# Patient Record
Sex: Female | Born: 1970
Health system: Southern US, Community
[De-identification: ages and names within clinical notes are randomized; demographics above are authoritative.]

## PROBLEM LIST (undated history)

## (undated) DIAGNOSIS — L732 Hidradenitis suppurativa: Secondary | ICD-10-CM

## (undated) DIAGNOSIS — A159 Respiratory tuberculosis unspecified: Secondary | ICD-10-CM

## (undated) DIAGNOSIS — G2581 Restless legs syndrome: Secondary | ICD-10-CM

## (undated) DIAGNOSIS — K8020 Calculus of gallbladder without cholecystitis without obstruction: Secondary | ICD-10-CM

## (undated) DIAGNOSIS — I10 Essential (primary) hypertension: Secondary | ICD-10-CM

## (undated) DIAGNOSIS — D333 Benign neoplasm of cranial nerves: Secondary | ICD-10-CM

## (undated) DIAGNOSIS — K648 Other hemorrhoids: Secondary | ICD-10-CM

## (undated) DIAGNOSIS — M1990 Unspecified osteoarthritis, unspecified site: Secondary | ICD-10-CM

## (undated) DIAGNOSIS — K219 Gastro-esophageal reflux disease without esophagitis: Secondary | ICD-10-CM

## (undated) DIAGNOSIS — Z1211 Encounter for screening for malignant neoplasm of colon: Secondary | ICD-10-CM

## (undated) DIAGNOSIS — J45909 Unspecified asthma, uncomplicated: Secondary | ICD-10-CM

## (undated) DIAGNOSIS — H9319 Tinnitus, unspecified ear: Secondary | ICD-10-CM

## (undated) HISTORY — DX: Essential (primary) hypertension: I10

## (undated) HISTORY — DX: Unspecified asthma, uncomplicated: J45.909

## (undated) HISTORY — DX: Encounter for screening for malignant neoplasm of colon: Z12.11

## (undated) HISTORY — DX: Unspecified osteoarthritis, unspecified site: M19.90

## (undated) HISTORY — DX: Respiratory tuberculosis unspecified: A15.9

## (undated) HISTORY — PX: BILATERAL CARPAL TUNNEL RELEASE: SHX6508

## (undated) HISTORY — PX: TUBAL LIGATION: SHX77

---
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
## 2005-01-30 ENCOUNTER — Ambulatory Visit: Payer: Self-pay | Admitting: Emergency Medicine

## 2005-01-30 ENCOUNTER — Emergency Department: Payer: Self-pay | Admitting: Emergency Medicine

## 2005-01-30 ENCOUNTER — Other Ambulatory Visit: Payer: Self-pay

## 2005-11-27 ENCOUNTER — Encounter: Payer: Self-pay | Admitting: Maternal & Fetal Medicine

## 2006-03-20 ENCOUNTER — Observation Stay: Payer: Self-pay | Admitting: Obstetrics and Gynecology

## 2006-04-07 ENCOUNTER — Observation Stay: Payer: Self-pay | Admitting: Obstetrics and Gynecology

## 2006-04-25 ENCOUNTER — Inpatient Hospital Stay: Payer: Self-pay

## 2009-06-30 ENCOUNTER — Ambulatory Visit: Payer: Self-pay | Admitting: General Practice

## 2010-09-06 ENCOUNTER — Emergency Department: Payer: Self-pay | Admitting: *Deleted

## 2012-02-02 ENCOUNTER — Ambulatory Visit: Payer: Self-pay | Admitting: Internal Medicine

## 2012-02-16 ENCOUNTER — Ambulatory Visit: Payer: Self-pay | Admitting: Internal Medicine

## 2012-02-23 ENCOUNTER — Ambulatory Visit: Payer: Self-pay | Admitting: Internal Medicine

## 2013-02-07 ENCOUNTER — Ambulatory Visit: Payer: Self-pay | Admitting: General Practice

## 2013-07-23 ENCOUNTER — Ambulatory Visit: Payer: Self-pay | Admitting: Internal Medicine

## 2014-05-09 NOTE — Op Note (Signed)
PATIENT NAME:  Theresa Lester, Theresa Lester MR#:  [id number] DATE OF BIRTH:  1971-01-13  DATE OF PROCEDURE:  02/07/2013  PREOPERATIVE DIAGNOSIS: Right carpal tunnel syndrome.   POSTOPERATIVE DIAGNOSIS: Right carpal tunnel syndrome.  PROCEDURE PERFORMED: Right carpal tunnel release.   SURGEON: Skip Estimable, MD.   ANESTHESIA: General.   ESTIMATED BLOOD LOSS: Minimal.   TOURNIQUET TIME: 31 minutes.   DRAINS: None.   INDICATIONS FOR SURGERY: The patient is a 44 year old female, who has been seen for complaints of numbness and paresthesias to the right hand. Previous EMG nerve conduction studies were consistent with carpal tunnel syndrome. Symptoms have progressed more recently. After discussion of the risks and benefits of surgical intervention, the patient expressed understanding of the risks, benefits, and agreed with plans for surgical intervention.   PROCEDURE IN DETAIL: The patient was brought in the operating room and then, after adequate general anesthesia was achieved, a tourniquet was placed on the patient's upper right arm. The patient's right hand and arm were cleaned and prepped with alcohol and DuraPrep and draped in the usual sterile fashion. A "timeout" was performed as per usual protocol. The right upper extremity was exsanguinated using an Esmarch, and tourniquet was inflated to 250 mmHg. Loupe magnification was used throughout the procedure. A curvilinear incision was made just ulnar to the thenar palmar crease. Dissection was carried down through the palmar fascia to the transverse carpal ligament. The transverse carpal ligament was sharply incised, taking care to protect the underlying structures within the carpal tunnel. Complete release of the transverse carpal ligament was achieved. Inspection of the median nerve demonstrated indentation consistent with compression at the previous site of the transverse carpal ligament. No evidence of lipoma or ganglion cyst within the tunnel. The wound  was irrigated with copious amounts of normal saline with antibiotic solution. Skin edges were reapproximated using interrupted sutures of 5-0 nylon. Marcaine 0.25% (10 mL) was injected along the incision site. A sterile dressing was applied followed by application of a volar splint. Tourniquet was deflated after total tourniquet time of 31 minutes. The patient tolerated the procedure well. She was transported to the recovery room in stable condition. ____________________________ Laurice Record. Holley Bouche., MD jph:aw D: 02/07/2013 08:59:51 ET T: 02/07/2013 09:14:52 ET JOB#: [id number]  cc: Jeneen Rinks P. Holley Bouche., MD, <Dictator> Laurice Record Holley Bouche MD ELECTRONICALLY SIGNED 03/07/2013 6:37

## 2014-10-19 ENCOUNTER — Encounter: Payer: Self-pay | Admitting: Internal Medicine

## 2014-10-19 ENCOUNTER — Other Ambulatory Visit: Payer: Self-pay | Admitting: Internal Medicine

## 2014-10-19 DIAGNOSIS — K8020 Calculus of gallbladder without cholecystitis without obstruction: Secondary | ICD-10-CM | POA: Insufficient documentation

## 2014-10-19 DIAGNOSIS — K130 Diseases of lips: Secondary | ICD-10-CM | POA: Insufficient documentation

## 2014-10-19 DIAGNOSIS — R000 Tachycardia, unspecified: Secondary | ICD-10-CM

## 2014-10-19 DIAGNOSIS — K648 Other hemorrhoids: Secondary | ICD-10-CM | POA: Insufficient documentation

## 2014-10-19 DIAGNOSIS — M25559 Pain in unspecified hip: Secondary | ICD-10-CM

## 2014-10-19 DIAGNOSIS — G2581 Restless legs syndrome: Secondary | ICD-10-CM | POA: Insufficient documentation

## 2014-10-19 DIAGNOSIS — R519 Headache, unspecified: Secondary | ICD-10-CM | POA: Insufficient documentation

## 2014-10-19 DIAGNOSIS — T783XXA Angioneurotic edema, initial encounter: Secondary | ICD-10-CM | POA: Insufficient documentation

## 2014-10-19 DIAGNOSIS — K219 Gastro-esophageal reflux disease without esophagitis: Secondary | ICD-10-CM | POA: Insufficient documentation

## 2014-10-19 DIAGNOSIS — R739 Hyperglycemia, unspecified: Secondary | ICD-10-CM

## 2014-10-19 DIAGNOSIS — R51 Headache: Secondary | ICD-10-CM

## 2014-10-19 HISTORY — DX: Tachycardia, unspecified: R00.0

## 2014-10-19 HISTORY — DX: Pain in unspecified hip: M25.559

## 2014-10-19 HISTORY — DX: Hyperglycemia, unspecified: R73.9

## 2014-10-20 ENCOUNTER — Ambulatory Visit (INDEPENDENT_AMBULATORY_CARE_PROVIDER_SITE_OTHER): Payer: Commercial Managed Care - PPO | Admitting: Internal Medicine

## 2014-10-20 ENCOUNTER — Encounter: Payer: Self-pay | Admitting: Internal Medicine

## 2014-10-20 VITALS — BP 132/82 | HR 80 | Ht 63.0 in | Wt 197.2 lb

## 2014-10-20 DIAGNOSIS — Z23 Encounter for immunization: Secondary | ICD-10-CM | POA: Diagnosis not present

## 2014-10-20 DIAGNOSIS — G44209 Tension-type headache, unspecified, not intractable: Secondary | ICD-10-CM | POA: Diagnosis not present

## 2014-10-20 DIAGNOSIS — G43909 Migraine, unspecified, not intractable, without status migrainosus: Secondary | ICD-10-CM

## 2014-10-20 MED ORDER — BUTALBITAL-APAP-CAFFEINE 50-325-40 MG PO TABS: 1.0000 | ORAL_TABLET | Freq: Two times a day (BID) | ORAL | Status: DC | PRN

## 2014-10-20 NOTE — Progress Notes (Signed)
Date:  10/20/2014   Name:  Theresa Lester   DOB:  01/18/1970   MRN:  [id number]   Chief Complaint: Headache Headache  This is a chronic problem. The current episode started 1 to 4 weeks ago. The problem occurs daily. The problem has been unchanged. The pain is located in the right unilateral region. The pain radiates to the face, right arm and right shoulder. The pain quality is not similar to prior headaches. The quality of the pain is described as band-like. The pain is mild. Associated symptoms include eye watering, numbness, scalp tenderness and tingling. Pertinent negatives include no fever, hearing loss, loss of balance, nausea, neck pain, phonophobia, photophobia, visual change or weakness. She has tried Excedrin for the symptoms. The treatment provided mild relief.   She has a long history of headaches which she describes as tension headaches. She takes aspirin for those. Over the past 2 months that she's had a headache on the right side which she describes as bandlike. Pain level 6 out of 10. The headache starts it radiates down the back of her neck and also causes some tingling sensation in her right upper extremity. She denies any weakness, nausea, vomiting, or light sensitivity.  Review of Systems:  Review of Systems  Constitutional: Negative for fever, fatigue and unexpected weight change.  HENT: Negative for hearing loss.   Eyes: Negative for photophobia.  Respiratory: Negative for chest tightness and shortness of breath.   Cardiovascular: Negative for chest pain, palpitations and leg swelling.  Gastrointestinal: Negative for nausea and constipation.  Musculoskeletal: Positive for neck stiffness. Negative for arthralgias and neck pain.  Neurological: Positive for tingling, numbness and headaches. Negative for weakness and loss of balance.  Psychiatric/Behavioral: Negative for dysphoric mood and decreased concentration.    Patient Active Problem List   Diagnosis Date Noted   . Angioneurotic edema 10/19/2014  . Arthralgia of hip or thigh 10/19/2014  . Chapped lips 10/19/2014  . Episodic headache 10/19/2014  . Calculus of gallbladder 10/19/2014  . Acid reflux 10/19/2014  . Blood glucose elevated 10/19/2014  . Hemorrhoids, internal 10/19/2014  . Restless leg 10/19/2014  . Fast heart beat 10/19/2014    Prior to Admission medications   Medication Sig Start Date End Date Taking? Authorizing Provider  aspirin 325 MG tablet Take 325 mg by mouth daily.   Yes Historical Provider, MD  bismuth subsalicylate (PEPTO BISMOL) 262 MG/15ML suspension Take 30 mLs by mouth every 6 (six) hours as needed.   Yes Historical Provider, MD    No Known Allergies  Past Surgical History  Procedure Laterality Date  . Bilateral carpal tunnel release    . Tubal ligation      Social History  Substance Use Topics  . Smoking status: Never Smoker   . Smokeless tobacco: None  . Alcohol Use: No     Medication list has been reviewed and updated.  Physical Examination:  Physical Exam  Constitutional: She is oriented to person, place, and time. She appears well-developed and well-nourished. No distress.  HENT:  Head: Normocephalic and atraumatic.  Eyes: Conjunctivae and EOM are normal. Pupils are equal, round, and reactive to light. Right eye exhibits no discharge. Left eye exhibits no discharge. No scleral icterus.  Neck: Carotid bruit is not present. Decreased range of motion present. No thyromegaly present.  Cardiovascular: Normal rate, regular rhythm and normal heart sounds.   Pulmonary/Chest: Effort normal and breath sounds normal. No respiratory distress.  Musculoskeletal:  Cervical back: She exhibits decreased range of motion, tenderness and spasm.  Neurological: She is alert and oriented to person, place, and time. She has normal strength and normal reflexes. No cranial nerve deficit or sensory deficit.  Skin: Skin is warm and dry. No rash noted.  Psychiatric: Her  behavior is normal. Thought content normal. Her mood appears anxious.  Nursing note and vitals reviewed.   BP 132/82 mmHg  Pulse 80  Ht 5\' 3"  (1.6 m)  Wt 197 lb 3.2 oz (89.449 kg)  BMI 34.94 kg/m2  Assessment and Plan: 1. Mixed migraine and muscle contraction headache (Fioricet 1 twice a day as needed for headache Recommend heat and topical rubs to relieve neck muscle tension Consider migraine preventative such as Topamax at follow-up visit if headache persists - butalbital-acetaminophen-caffeine (FIORICET, ESGIC) 50-325-40 MG tablet; Take 1 tablet by mouth 2 (two) times daily as needed for headache or migraine.  Dispense: 60 tablet; Refill: 1  2. Flu vaccine need - Flu Vaccine QUAD 36+ mos PF IM (Fluarix & Fluzone Quad PF)   Halina Maidens, MD [facility]  10/20/2014

## 2014-10-21 ENCOUNTER — Telehealth: Payer: Self-pay

## 2014-10-21 ENCOUNTER — Other Ambulatory Visit: Payer: Self-pay | Admitting: Internal Medicine

## 2014-10-21 MED ORDER — TRIAMCINOLONE ACETONIDE 0.025 % EX OINT: 1.0000 "application " | TOPICAL_OINTMENT | Freq: Two times a day (BID) | CUTANEOUS | Status: DC

## 2014-10-21 NOTE — Telephone Encounter (Signed)
Patient wants refill on Triamcinolone Acetonide Cream 0.025% for lips, K13.0. dr

## 2014-11-23 ENCOUNTER — Ambulatory Visit: Payer: Commercial Managed Care - PPO | Admitting: Internal Medicine

## 2014-11-25 ENCOUNTER — Ambulatory Visit (INDEPENDENT_AMBULATORY_CARE_PROVIDER_SITE_OTHER): Payer: Commercial Managed Care - PPO | Admitting: Internal Medicine

## 2014-11-25 ENCOUNTER — Encounter: Payer: Self-pay | Admitting: Internal Medicine

## 2014-11-25 VITALS — BP 120/70 | HR 76 | Ht 63.0 in | Wt 191.4 lb

## 2014-11-25 DIAGNOSIS — R479 Unspecified speech disturbances: Secondary | ICD-10-CM

## 2014-11-25 DIAGNOSIS — H9319 Tinnitus, unspecified ear: Secondary | ICD-10-CM

## 2014-11-25 DIAGNOSIS — H9311 Tinnitus, right ear: Secondary | ICD-10-CM

## 2014-11-25 DIAGNOSIS — G43909 Migraine, unspecified, not intractable, without status migrainosus: Secondary | ICD-10-CM | POA: Diagnosis not present

## 2014-11-25 DIAGNOSIS — G44209 Tension-type headache, unspecified, not intractable: Secondary | ICD-10-CM

## 2014-11-25 HISTORY — DX: Tinnitus, unspecified ear: H93.19

## 2014-11-25 MED ORDER — BUTALBITAL-APAP-CAFFEINE 50-325-40 MG PO TABS: 1.0000 | ORAL_TABLET | Freq: Two times a day (BID) | ORAL | Status: DC | PRN

## 2014-11-25 MED ORDER — CYCLOBENZAPRINE HCL 10 MG PO TABS: 5.0000 mg | ORAL_TABLET | Freq: Two times a day (BID) | ORAL | Status: DC | PRN

## 2014-11-25 NOTE — Progress Notes (Signed)
Date:  11/25/2014   Name:  Theresa Lester   DOB:  1970/10/20   MRN:  [id number]   Chief Complaint: Headache Headache  This is a recurrent problem. The current episode started more than 1 month ago. The problem occurs daily. The problem has been unchanged. The pain is located in the right unilateral region. The pain quality is similar to prior headaches. Associated symptoms include hearing loss and tinnitus. Pertinent negatives include no dizziness, fever, numbness, seizures or weakness. Nothing aggravates the symptoms.   She continues to have a headache daily. The headache quality and severity waxes and wanes. It seems to be worse when she tries to sleep. She does get relief from butalbital prescribed last visit. She also relates an issue with speech. At times she has difficulty enunciating a word. She describes it as slurring but not with every word. The speech difficulty is not associated with headache. She states this has been happening off and on for a number of months. Her other complaint is ringing in the right ear and difficulty understanding speech. The ringing is more or less constant but the lack of understanding of speech comes and goes. She doesn't describe it as misunderstanding the meaning but as not hearing the words clearly. This is also not associated with severe headache. She has no history of neurology evaluation for headache or other neurological symptoms. She has never had an MRI or CT scan of the brain.   Review of Systems  Constitutional: Negative for fever and fatigue.  HENT: Positive for hearing loss and tinnitus. Negative for trouble swallowing and voice change.   Eyes: Negative for visual disturbance.  Respiratory: Negative for chest tightness and shortness of breath.   Cardiovascular: Negative for chest pain.  Musculoskeletal: Positive for myalgias and neck stiffness. Negative for gait problem.  Neurological: Positive for speech difficulty and headaches.  Negative for dizziness, seizures, syncope, facial asymmetry, weakness and numbness.       Intermittent dysphagia and intermittent impairment of hearing  Hematological: Negative for adenopathy.  Psychiatric/Behavioral: Negative for confusion and dysphoric mood.    Patient Active Problem List   Diagnosis Date Noted  . Mixed migraine and muscle contraction headache 10/20/2014  . Angioneurotic edema 10/19/2014  . Arthralgia of hip or thigh 10/19/2014  . Chapped lips 10/19/2014  . Episodic headache 10/19/2014  . Calculus of gallbladder 10/19/2014  . Acid reflux 10/19/2014  . Blood glucose elevated 10/19/2014  . Hemorrhoids, internal 10/19/2014  . Restless leg 10/19/2014  . Fast heart beat 10/19/2014    Prior to Admission medications   Medication Sig Start Date End Date Taking? Authorizing Provider  aspirin 325 MG tablet Take 325 mg by mouth daily.   Yes Historical Provider, MD  bismuth subsalicylate (PEPTO BISMOL) 262 MG/15ML suspension Take 30 mLs by mouth every 6 (six) hours as needed.   Yes Historical Provider, MD  butalbital-acetaminophen-caffeine (FIORICET, ESGIC) 50-325-40 MG tablet Take 1 tablet by mouth 2 (two) times daily as needed for headache or migraine. 10/20/14  Yes [name], MD  triamcinolone (KENALOG) 0.025 % ointment Apply 1 application topically 2 (two) times daily. 10/21/14  Yes [name], MD    No Known Allergies  Past Surgical History  Procedure Laterality Date  . Bilateral carpal tunnel release    . Tubal ligation      Social History  Substance Use Topics  . Smoking status: Never Smoker   . Smokeless tobacco: None  . Alcohol Use: No  Medication list has been reviewed and updated.   Physical Exam  Constitutional: She is oriented to person, place, and time. She appears well-developed and well-nourished.  HENT:  Left Ear: Tympanic membrane and ear canal normal.  Excessive cerumen right ear canal  Eyes: EOM are normal. Pupils are  equal, round, and reactive to light. Right eye exhibits no discharge. Left eye exhibits no discharge.  Neck: Muscular tenderness present. Carotid bruit is not present. No thyromegaly present.  Cardiovascular: Normal rate, regular rhythm and normal heart sounds.   Pulmonary/Chest: Effort normal and breath sounds normal.  Musculoskeletal: She exhibits no edema.  Lymphadenopathy:    She has no cervical adenopathy.  Neurological: She is alert and oriented to person, place, and time. She has normal strength and normal reflexes. No cranial nerve deficit.  Psychiatric: She has a normal mood and affect. Her behavior is normal. Her speech is not delayed and not slurred.  Nursing note and vitals reviewed.   BP 120/70 mmHg  Pulse 76  Ht 5\' 3"  (1.6 m)  Wt 191 lb 6.4 oz (86.818 kg)  BMI 33.91 kg/m2  Assessment and Plan: 1. Mixed migraine and muscle contraction headache Begin Flexeril one half tablet twice a day as needed for muscle tension Continue butalbital as needed - cyclobenzaprine (FLEXERIL) 10 MG tablet; Take 0.5 tablets (5 mg total) by mouth 2 (two) times daily as needed for muscle spasms.  Dispense: 30 tablet; Refill: 1 - butalbital-acetaminophen-caffeine (FIORICET, ESGIC) 50-325-40 MG tablet; Take 1 tablet by mouth 2 (two) times daily as needed for headache or migraine.  Dispense: 60 tablet; Refill: 1 - MR Brain W Wo Contrast; Future  2. Speech abnormality Concern for possible intracranial pathology MRI will provide the most comprehensive information - MR Brain W Wo Contrast; Future  3. Tinnitus, right Possible acoustic neuroma - await MRI results - MR Brain W Wo Contrast; Future   Halina Maidens, MD [facility]  11/25/2014

## 2014-12-11 ENCOUNTER — Ambulatory Visit
Admission: RE | Admit: 2014-12-11 | Discharge: 2014-12-11 | Disposition: A | Discharge status: Home / Self Care | Payer: Commercial Managed Care - PPO | Source: Ambulatory Visit | Attending: Internal Medicine | Admitting: Internal Medicine

## 2014-12-11 DIAGNOSIS — G43909 Migraine, unspecified, not intractable, without status migrainosus: Secondary | ICD-10-CM

## 2014-12-11 DIAGNOSIS — H9311 Tinnitus, right ear: Secondary | ICD-10-CM

## 2014-12-11 DIAGNOSIS — R479 Unspecified speech disturbances: Secondary | ICD-10-CM

## 2014-12-11 DIAGNOSIS — G44209 Tension-type headache, unspecified, not intractable: Principal | ICD-10-CM

## 2014-12-11 MED ORDER — GADOBENATE DIMEGLUMINE 529 MG/ML IV SOLN: 20.0000 mL | Freq: Once | INTRAVENOUS | Status: DC | PRN

## 2014-12-14 ENCOUNTER — Telehealth: Payer: Self-pay

## 2014-12-14 ENCOUNTER — Other Ambulatory Visit: Payer: Self-pay | Admitting: Internal Medicine

## 2014-12-14 MED ORDER — DIAZEPAM 5 MG PO TABS: 5.0000 mg | ORAL_TABLET | Freq: Once | ORAL | Status: DC

## 2014-12-14 NOTE — Telephone Encounter (Signed)
Patient needs pill to " relax " so she can do MRI.dr

## 2014-12-22 ENCOUNTER — Telehealth: Payer: Self-pay

## 2014-12-22 NOTE — Telephone Encounter (Signed)
done

## 2015-01-05 ENCOUNTER — Other Ambulatory Visit: Payer: Self-pay | Admitting: Internal Medicine

## 2015-01-05 ENCOUNTER — Ambulatory Visit
Admission: RE | Admit: 2015-01-05 | Discharge: 2015-01-05 | Disposition: A | Discharge status: Home / Self Care | Payer: Commercial Managed Care - PPO | Source: Ambulatory Visit | Attending: Internal Medicine | Admitting: Internal Medicine

## 2015-01-05 DIAGNOSIS — R479 Unspecified speech disturbances: Secondary | ICD-10-CM | POA: Insufficient documentation

## 2015-01-05 DIAGNOSIS — G43909 Migraine, unspecified, not intractable, without status migrainosus: Secondary | ICD-10-CM | POA: Insufficient documentation

## 2015-01-05 DIAGNOSIS — H9311 Tinnitus, right ear: Secondary | ICD-10-CM | POA: Diagnosis present

## 2015-01-05 DIAGNOSIS — D333 Benign neoplasm of cranial nerves: Secondary | ICD-10-CM | POA: Insufficient documentation

## 2015-01-05 DIAGNOSIS — R9089 Other abnormal findings on diagnostic imaging of central nervous system: Secondary | ICD-10-CM

## 2015-01-05 MED ORDER — GADOBENATE DIMEGLUMINE 529 MG/ML IV SOLN
20.0000 mL | Freq: Once | INTRAVENOUS | Status: AC | PRN
  Administered 2015-01-05: 18 mL via INTRAVENOUS

## 2015-01-12 ENCOUNTER — Ambulatory Visit (INDEPENDENT_AMBULATORY_CARE_PROVIDER_SITE_OTHER): Payer: Commercial Managed Care - PPO | Admitting: Internal Medicine

## 2015-01-12 ENCOUNTER — Encounter: Payer: Self-pay | Admitting: Internal Medicine

## 2015-01-12 VITALS — BP 124/80 | HR 80 | Ht 63.0 in | Wt 187.4 lb

## 2015-01-12 DIAGNOSIS — D333 Benign neoplasm of cranial nerves: Secondary | ICD-10-CM | POA: Insufficient documentation

## 2015-01-12 DIAGNOSIS — S91209A Unspecified open wound of unspecified toe(s) with damage to nail, initial encounter: Secondary | ICD-10-CM | POA: Diagnosis not present

## 2015-01-12 DIAGNOSIS — L03032 Cellulitis of left toe: Secondary | ICD-10-CM

## 2015-01-12 DIAGNOSIS — Z86018 Personal history of other benign neoplasm: Secondary | ICD-10-CM | POA: Insufficient documentation

## 2015-01-12 MED ORDER — AMOXICILLIN-POT CLAVULANATE 875-125 MG PO TABS: 1.0000 | ORAL_TABLET | Freq: Two times a day (BID) | ORAL | Status: DC

## 2015-01-12 NOTE — Progress Notes (Signed)
Date:  01/12/2015   Name:  Theresa Lester   DOB:  01-24-1970   MRN:  [id number]   Chief Complaint: Toe Injury Toe Pain  The incident occurred 3 to 5 days ago. The incident occurred at home. The injury mechanism was a direct blow (jammed left great toe against a punch bowl on the floor). The pain is present in the left toes. The patient is experiencing no pain (unless wearing shoes). Pertinent negatives include no numbness.    Review of Systems  Constitutional: Negative for chills and fatigue.  HENT: Positive for congestion and tinnitus.   Respiratory: Negative for chest tightness and shortness of breath.   Cardiovascular: Negative for chest pain.  Skin: Positive for wound.  Neurological: Positive for light-headedness and headaches. Negative for tremors and numbness.    Patient Active Problem List   Diagnosis Date Noted  . Speech abnormality 11/25/2014  . Tinnitus 11/25/2014  . Mixed migraine and muscle contraction headache 10/20/2014  . Angioneurotic edema 10/19/2014  . Arthralgia of hip or thigh 10/19/2014  . Chapped lips 10/19/2014  . Episodic headache 10/19/2014  . Calculus of gallbladder 10/19/2014  . Acid reflux 10/19/2014  . Blood glucose elevated 10/19/2014  . Hemorrhoids, internal 10/19/2014  . Restless leg 10/19/2014  . Fast heart beat 10/19/2014    Prior to Admission medications   Medication Sig Start Date End Date Taking? Authorizing Provider  aspirin 325 MG tablet Take 325 mg by mouth daily.   Yes Historical Provider, MD  bismuth subsalicylate (PEPTO BISMOL) 262 MG/15ML suspension Take 30 mLs by mouth every 6 (six) hours as needed.   Yes Historical Provider, MD  butalbital-acetaminophen-caffeine (FIORICET, ESGIC) 50-325-40 MG tablet Take 1 tablet by mouth 2 (two) times daily as needed for headache or migraine. 11/25/14  Yes [name], MD  cyclobenzaprine (FLEXERIL) 10 MG tablet Take 0.5 tablets (5 mg total) by mouth 2 (two) times daily as needed  for muscle spasms. 11/25/14  Yes [name], MD  diazepam (VALIUM) 5 MG tablet Take 1 tablet (5 mg total) by mouth once. Take once 1 hours before MRI.  Must have a driver to and from the MRI appointment. 12/14/14  Yes [name], MD  triamcinolone (KENALOG) 0.025 % ointment Apply 1 application topically 2 (two) times daily. 10/21/14  Yes [name], MD    No Known Allergies  Past Surgical History  Procedure Laterality Date  . Bilateral carpal tunnel release    . Tubal ligation      Social History  Substance Use Topics  . Smoking status: Never Smoker   . Smokeless tobacco: None  . Alcohol Use: No     Medication list has been reviewed and updated.   Physical Exam  Constitutional: She appears well-developed and well-nourished. No distress.  Cardiovascular: Normal rate, regular rhythm and normal heart sounds.   Pulmonary/Chest: Effort normal and breath sounds normal.  Skin:  Redness around left great toe nail with purple discoloration under the nail.  Base of the nail appears to be elevated and separated from the nail bed.  Nursing note and vitals reviewed.   BP 124/80 mmHg  Pulse 80  Ht 5\' 3"  (1.6 m)  Wt 187 lb 6.4 oz (85.004 kg)  BMI 33.20 kg/m2  Assessment and Plan: 1. Cellulitis of toe of left foot - amoxicillin-clavulanate (AUGMENTIN) 875-125 MG tablet; Take 1 tablet by mouth 2 (two) times daily.  Dispense: 20 tablet; Refill: 0  2. Nail avulsion  of toe, initial encounter Local care advised; if nail separates will refer to Podiatry  3. Unilateral vestibular schwannoma [facility]) Referred to Neurosurgery - appointment in January   Halina Maidens, MD [facility]  01/12/2015

## 2015-01-13 ENCOUNTER — Ambulatory Visit: Payer: Commercial Managed Care - PPO | Admitting: Internal Medicine

## 2015-01-17 HISTORY — PX: VESTIBULAR NERVE SECTION: SUR1439

## 2015-01-31 ENCOUNTER — Other Ambulatory Visit: Payer: Self-pay | Admitting: Internal Medicine

## 2015-02-03 ENCOUNTER — Other Ambulatory Visit: Payer: Self-pay | Admitting: Neurosurgery

## 2015-02-03 DIAGNOSIS — D333 Benign neoplasm of cranial nerves: Secondary | ICD-10-CM

## 2015-02-10 ENCOUNTER — Other Ambulatory Visit: Payer: Self-pay | Admitting: Radiation Therapy

## 2015-02-10 ENCOUNTER — Ambulatory Visit
Admission: RE | Admit: 2015-02-10 | Discharge: 2015-02-10 | Disposition: A | Discharge status: Home / Self Care | Payer: Commercial Managed Care - PPO | Source: Ambulatory Visit | Attending: Neurosurgery | Admitting: Neurosurgery

## 2015-02-10 DIAGNOSIS — D333 Benign neoplasm of cranial nerves: Secondary | ICD-10-CM

## 2015-02-10 MED ORDER — GADOBENATE DIMEGLUMINE 529 MG/ML IV SOLN
17.0000 mL | Freq: Once | INTRAVENOUS | Status: AC | PRN
  Administered 2015-02-10: 17 mL via INTRAVENOUS

## 2015-02-17 ENCOUNTER — Ambulatory Visit
Admission: RE | Admit: 2015-02-17 | Discharge: 2015-02-17 | Disposition: A | Discharge status: Home / Self Care | Payer: Commercial Managed Care - PPO | Source: Ambulatory Visit | Attending: Radiation Oncology | Admitting: Radiation Oncology

## 2015-02-17 ENCOUNTER — Encounter: Payer: Self-pay | Admitting: Radiation Oncology

## 2015-02-17 VITALS — BP 126/89 | HR 82 | Temp 98.4°F | Resp 16 | Ht 63.0 in | Wt 183.5 lb

## 2015-02-17 DIAGNOSIS — K648 Other hemorrhoids: Secondary | ICD-10-CM | POA: Insufficient documentation

## 2015-02-17 DIAGNOSIS — Z8249 Family history of ischemic heart disease and other diseases of the circulatory system: Secondary | ICD-10-CM | POA: Diagnosis not present

## 2015-02-17 DIAGNOSIS — D333 Benign neoplasm of cranial nerves: Secondary | ICD-10-CM | POA: Diagnosis present

## 2015-02-17 DIAGNOSIS — Z833 Family history of diabetes mellitus: Secondary | ICD-10-CM | POA: Insufficient documentation

## 2015-02-17 DIAGNOSIS — K8020 Calculus of gallbladder without cholecystitis without obstruction: Secondary | ICD-10-CM | POA: Insufficient documentation

## 2015-02-17 DIAGNOSIS — K219 Gastro-esophageal reflux disease without esophagitis: Secondary | ICD-10-CM | POA: Diagnosis not present

## 2015-02-17 DIAGNOSIS — G2581 Restless legs syndrome: Secondary | ICD-10-CM | POA: Diagnosis not present

## 2015-02-17 DIAGNOSIS — Z510 Encounter for antineoplastic radiation therapy: Secondary | ICD-10-CM | POA: Insufficient documentation

## 2015-02-17 HISTORY — DX: Benign neoplasm of cranial nerves: D33.3

## 2015-02-17 HISTORY — DX: Calculus of gallbladder without cholecystitis without obstruction: K80.20

## 2015-02-17 HISTORY — DX: Other hemorrhoids: K64.8

## 2015-02-17 HISTORY — DX: Restless legs syndrome: G25.81

## 2015-02-17 HISTORY — DX: Gastro-esophageal reflux disease without esophagitis: K21.9

## 2015-02-17 LAB — BUN AND CREATININE (CC13)
BUN: 13 mg/dL (ref 7.0–26.0)
Creatinine: 0.7 mg/dL (ref 0.6–1.1)
EGFR: >90 mL/min/{1.73_m2} (ref >90)

## 2015-02-17 MED ORDER — LORAZEPAM 0.5 MG PO TABS
ORAL_TABLET | ORAL | Status: DC
Start: 2015-02-17 — End: 2015-04-14

## 2015-02-17 NOTE — Progress Notes (Signed)
45 year old female.   Presented to Halina Maidens, MD of [facility] on 10/20/2014 complaining of headache that radiates down the back of her neck and right upper extremity, eye watering, numbness, scalp tenderness, tingling, speech abnormality, tinnitus x 4 weeks. Reports her headaches are well managed with Fiorcet and Flexeril.   Sent for MRI on 01/05/2015 which revealed a 7 mm mass in the right internal auditory canal consistent with vestibular schwannoma.   Patient was then referred to Dr. [name]. Dr. [name] then, referred the patient to Dr. [name] for consideration of fractioned stereotactic radiosurgery.  NKDA Denies hx of radiation therapy Denies having a pacemaker

## 2015-02-17 NOTE — Progress Notes (Signed)
See progress note under physician encounter. 

## 2015-02-17 NOTE — Progress Notes (Signed)
Radiation Oncology         [phone] ________________________________  Initial outpatient Consultation  Name: Theresa Lester MRN: [id number]  Date: 02/17/2015  DOB: [birth year]  NW:GNFAO Army Melia, MD  [name], MD   REFERRING PHYSICIAN: [name], MD  DIAGNOSIS: The encounter diagnosis was Unilateral vestibular schwannoma [facility]).    ICD-9-CM ICD-10-CM   1. Unilateral vestibular schwannoma (HCC) 225.1 D33.3     HISTORY OF PRESENT ILLNESS: Theresa Lester is a 45 y.o. female seen at the request of Dr. [name] for a right acoustic neuroma. The patient experienced approximately 6 months of headache, tinnitus, hearing loss and deficit and was seen in November by Dr. Army Melia. She underwent an MRI of the brain on 01/05/2015 revealing a 7 mm mass in the right internal auditory canal consistent with acoustic neuroma. She has met with Dr. [name], and has been offered stereotactic radiosurgery under the combined care of Dr. [name] and Dr. [name]. She comes today for further assessment in preparation for this. Of note she had her MRI repeated with T3 technology on 02/10/2015 and the lesion measured 7x 4.5 x 4.5 mm in the right auditory canal.  PREVIOUS RADIATION THERAPY: No  PAST MEDICAL HISTORY:  Past Medical History  Diagnosis Date  . Acoustic neuroma ([place])   . GERD (gastroesophageal reflux disease)   . Calculus of gallbladder   . Hemorrhoids, internal   . Restless leg     PAST SURGICAL HISTORY: Past Surgical History  Procedure Laterality Date  . Bilateral carpal tunnel release    . Tubal ligation      FAMILY HISTORY:  Family History  Problem Relation Age of Onset  . Hypertension Mother   . Diabetes Mother      SOCIAL HISTORY:  reports that she has never smoked. She has never used smokeless tobacco. She reports that she does not drink alcohol or use illicit drugs. The patient resides in [place], and is married. She has an 33-year-old son.  She works at [employer].   ALLERGIES: Review of patient's allergies indicates no known allergies.  MEDICATIONS:  Current Outpatient Prescriptions  Medication Sig Dispense Refill  . butalbital-acetaminophen-caffeine (FIORICET, ESGIC) 50-325-40 MG tablet Take 1 tablet by mouth 2 (two) times daily as needed for headache or migraine. 60 tablet 1  . cyclobenzaprine (FLEXERIL) 10 MG tablet TAKE 0.5 TABLETS (5 MG TOTAL) BY MOUTH 2 (TWO) TIMES DAILY AS NEEDED FOR MUSCLE SPASMS. 30 tablet 1  . triamcinolone (KENALOG) 0.025 % ointment Apply 1 application topically 2 (two) times daily. 15 g 0  . aspirin 325 MG tablet Take 325 mg by mouth daily. Reported on 02/17/2015    . bismuth subsalicylate (PEPTO BISMOL) 262 MG/15ML suspension Take 30 mLs by mouth every 6 (six) hours as needed. Reported on 02/17/2015    . diazepam (VALIUM) 5 MG tablet Take 1 tablet (5 mg total) by mouth once. Take once 1 hours before MRI.  Must have a driver to and from the MRI appointment. (Patient not taking: Reported on 02/17/2015) 1 tablet 1   No current facility-administered medications for this encounter.    REVIEW OF SYSTEMS:  On review of systems the patient reports that overall she is doing okay. She states that the last 6 months she has been experiencing daily headaches that are noted to be throbbing in nature, constant through the day, and only helped by medicating with Flexeril or Fioricet. She states that she experiences these headaches over the right temporal which radiates  over 4 into the left. She has experienced a few episodes of nausea when this occurs. She denies any emesis. She also reports that since that fullness and some right sided swelling of her joints and more soreness and occasional weakness on the right versus left. She states that this is most notable during an intense headache. She denies any double double vision or blurred vision. She has noticed a sense of speech deficit no her husband is unable to  detect any slurring. She also reports that she has repeatedly asking questions because she is unable to hear lymphomatous talking. She states that she is unable to distinguish whether or not this is specific to high frequency her low frequency. She denies any difficulty with gait or balance disturbances. She is not expensing chest pain, shortness of breath, fevers or chills or unintended weight changes.   PHYSICAL EXAM:  height is _0  (1.6 m) and weight is 183 lb 8 oz (83.235 kg). Her oral temperature is 98.4 F (36.9 C). Her blood pressure is 126/89 and her pulse is 82. Her respiration is 16 and oxygen saturation is 100%.   Pain Scale 0/10  In general this is a well-appearing Asian female in no acute distress. She is and oriented x4 during the exam. HEENT reveals that she has normocephalic, atraumatic, EOMs are intact. Cranial nerves II through XII appear intact, with decreased high frequency hearing on the right but intact on the left. Cardiovascular exam reveals a regular rate and rhythm no clicks rubs or murmurs auscultated. Chest is clear to auscultation bilaterally. Lymphatic review is performed and no evidence of any palpable adenopathy in the cervical, supra clavicular axillary chains are identified. The abdomen is intact with active bowel sounds in all quadrants is soft, nontender nondistended. No lower extremity edema is appreciated.   KPS = 90  100 - Normal; no complaints; no evidence of disease. 90   - Able to carry on normal activity; minor signs or symptoms of disease. 80   - Normal activity with effort; some signs or symptoms of disease. 47   - Cares for self; unable to carry on normal activity or to do active work. 60   - Requires occasional assistance, but is able to care for most of his personal needs. 50   - Requires considerable assistance and frequent medical care. 72   - Disabled; requires special care and assistance. 75   - Severely disabled; hospital admission is indicated  although death not imminent. 79   - Very sick; hospital admission necessary; active supportive treatment necessary. 10   - Moribund; fatal processes progressing rapidly. 0     - Dead  Karnofsky DA, Abelmann WH, Craver LS and Burchenal JH [phone]) The use of the nitrogen mustards in the palliative treatment of carcinoma: with particular reference to bronchogenic carcinoma Cancer 1 [phone]  LABORATORY DATA:  No results found for: WBC, HGB, HCT, MCV, PLT No results found for: NA, K, CL, CO2 No results found for: ALT, AST, GGT, ALKPHOS, BILITOT   RADIOGRAPHY: Mr Kizzie Fantasia Contrast  02/10/2015  CLINICAL DATA:  Followup vestibular schwannoma. Hearing loss on the right. S RS planning. EXAM: MRI HEAD WITHOUT AND WITH CONTRAST TECHNIQUE: Multiplanar, multiecho pulse sequences of the brain and surrounding structures were obtained without and with intravenous contrast. CONTRAST:  33m MULTIHANCE GADOBENATE DIMEGLUMINE 529 MG/ML IV SOLN COMPARISON:  01/05/2015 FINDINGS: The brain parenchyma itself again appears normal without evidence of malformation, old or acute infarction, mass lesion, hemorrhage,  hydrocephalus or extra-axial collection. Again demonstrated is an intra canalicular vestibular schwannoma on the right measuring 7 mm in length with a transverse diameter of 4.5 mm. No abnormality on the left. No other nerve or meningeal lesion identified. No other abnormal enhancement. No skull or skullbase abnormality. No pituitary mass. No inflammatory sinus disease. Major vessels are patent. IMPRESSION: No change. Normal study except for a 7 x 4.5 x 4.5 mm vestibular schwannoma, intra canalicular on the right. Electronically Signed   By: Nelson Chimes M.D.   On: 02/10/2015 14:25      IMPRESSION: Right acoustic neuroma  PLAN: The patient's case has been discussed that brain conference and reviewed by Dr. [name] and Dr. [name]. The plan would be to move forward with stereotactic radiosurgery in 5 fractions. The  patient is already scheduled for simulation this Friday at 1:00. Dr. [name] we'll meet face to face with the patient and will review the treatment schedule and strategy.  However, we have reviewed the side effects, risks and adjacent recovery today, and the patient is ready to move forward. In anticipation of anxiety about wearing the mass for the treatment, a prescription was provided for Ativan 0.5 mg #10 to be administered: one tab po 30 minutes prior to simulation her radiation no refills.  The above documentation reflects my direct findings during this shared patient visit. Please see the separate note by Dr. [name] on this date for the remainder of the patient's plan of care.  Carola Rhine, PAC

## 2015-02-19 ENCOUNTER — Ambulatory Visit
Admission: RE | Admit: 2015-02-19 | Discharge: 2015-02-19 | Disposition: A | Discharge status: Home / Self Care | Payer: Commercial Managed Care - PPO | Source: Ambulatory Visit | Attending: Radiation Oncology | Admitting: Radiation Oncology

## 2015-02-19 VITALS — BP 137/99 | HR 85

## 2015-02-19 VITALS — BP 136/87 | HR 89 | Resp 16 | Wt 185.1 lb

## 2015-02-19 DIAGNOSIS — Z510 Encounter for antineoplastic radiation therapy: Secondary | ICD-10-CM | POA: Diagnosis not present

## 2015-02-19 DIAGNOSIS — D333 Benign neoplasm of cranial nerves: Secondary | ICD-10-CM

## 2015-02-19 MED ORDER — SODIUM CHLORIDE 0.9% FLUSH
10.0000 mL | Freq: Once | INTRAVENOUS | Status: AC
  Administered 2015-02-19: 10 mL via INTRAVENOUS

## 2015-02-19 NOTE — Addendum Note (Signed)
Encounter addended by: Heywood Footman, RN on: 02/19/2015  2:42 PM<BR>     Documentation filed: Notes Section

## 2015-02-19 NOTE — Progress Notes (Signed)
Weight and vitals stable. Denies pain. Denies having a contrast allergy. Reports NKDA. Denies taking metformin or having a pacemaker. 2/1 BUN 13 and creatinine 0.7. Started right AC 22 gauge IV on the first attempt. Excellent blood return and site flushed without difficulty. Patient tolerated this well. Secure IV in place. Patient escorted to CT/SIM by Mont Dutton, RT.   BP 136/87 mmHg  Pulse 89  Resp 16  Wt 185 lb 1.6 oz (83.961 kg)  SpO2 100% Wt Readings from Last 3 Encounters:  02/19/15 185 lb 1.6 oz (83.961 kg)  02/17/15 183 lb 8 oz (83.235 kg)  01/12/15 187 lb 6.4 oz (85.004 kg)

## 2015-02-19 NOTE — Progress Notes (Signed)
Understand patient vomited a small amount following contrast injection. Received patient in nursing following simulation. She reports nausea has resolved but, she still has a metallic taste in her mouth. Provided patient with a coke to drink. Removed right AC 22 gauge IV. Catheter intact upon removal. Applied a bandaid to old IV site. Patient tolerated this well. Provided patient with treatment calendar. Patient discharged home with husband. Patient left clinic ambulatory and without needs.

## 2015-02-19 NOTE — Addendum Note (Signed)
Encounter addended by: Heywood Footman, RN on: 02/19/2015  2:36 PM<BR>     Documentation filed: Lines/Drains/Airways Education officer, museum

## 2015-02-19 NOTE — Progress Notes (Signed)
  Radiation Oncology         [phone]) [phone] ________________________________  Name: Theresa Lester MRN: [id number]  Date: 02/19/2015  DOB: 04/30/1970  SIMULATION AND TREATMENT PLANNING NOTE  DIAGNOSIS:  45 yo woman with right intracanicular 7 mm vestibular schwannoma and intact right hearing.    ICD-9-CM ICD-10-CM   1. Unilateral vestibular schwannoma (HCC) 225.1 D33.3     NARRATIVE:  The patient was brought to the [facility].  Identity was confirmed.  All relevant records and images related to the planned course of therapy were reviewed.  The patient freely provided informed written consent to proceed with treatment after reviewing the details related to the planned course of therapy. The consent form was witnessed and verified by the simulation staff. Intravenous access was established for contrast administration. Then, the patient was set-up in a stable reproducible supine position for radiation therapy.  A relocatable thermoplastic stereotactic head frame was fabricated for precise immobilization.  CT images were obtained.  Surface markings were placed.  The CT images were loaded into the planning software and fused with the patient's targeting MRI scan.  Then the target and avoidance structures were contoured.  Treatment planning then occurred.  The radiation prescription was entered and confirmed.  I have requested 3D planning  I have requested a DVH of the following structures: Brain stem, brain, left eye, right eye, lenses, optic chiasm, target volumes, uninvolved brain, and normal tissue.    SPECIAL TREATMENT PROCEDURE:  The planned course of therapy using radiation constitutes a special treatment procedure. Special care is required in the management of this patient for the following reasons. This treatment constitutes a Special Treatment Procedure for the following reason: High dose per fraction requiring special monitoring for increased toxicities of treatment  including daily imaging.  The special nature of the planned course of radiotherapy will require increased physician supervision and oversight to ensure patient's safety with optimal treatment outcomes.  PLAN:  The patient will receive 25 Gy in 5 fraction.  ________________________________  Sheral Apley [name], M.D.  This document serves as a record of services personally performed by [name], MD. It was created on his behalf by [name], a trained medical scribe. The creation of this record is based on the scribe's personal observations and the provider's statements to them. This document has been checked and approved by the attending provider.

## 2015-02-19 NOTE — Progress Notes (Signed)
Patient was given IV contrast in CT SIM and became nauseated.  She vomited a small amount.  She said she felt better after vomiting.  Vitals taken: bp 137/99, hr 85, oxygen saturation was 100% on room air.

## 2015-02-25 DIAGNOSIS — Z510 Encounter for antineoplastic radiation therapy: Secondary | ICD-10-CM | POA: Diagnosis not present

## 2015-03-01 ENCOUNTER — Ambulatory Visit
Admission: RE | Admit: 2015-03-01 | Discharge: 2015-03-01 | Disposition: A | Discharge status: Home / Self Care | Payer: Commercial Managed Care - PPO | Source: Ambulatory Visit | Attending: Radiation Oncology | Admitting: Radiation Oncology

## 2015-03-01 VITALS — BP 137/93 | HR 80 | Temp 98.1°F | Resp 16

## 2015-03-01 DIAGNOSIS — Z510 Encounter for antineoplastic radiation therapy: Secondary | ICD-10-CM | POA: Diagnosis not present

## 2015-03-01 DIAGNOSIS — D333 Benign neoplasm of cranial nerves: Secondary | ICD-10-CM

## 2015-03-01 NOTE — Op Note (Signed)
Stereotactic Radiosurgery Operative Note  Name: Theresa Lester MRN: [id number]  Date: 03/01/2015  DOB: 1970/07/26  Op Note  Pre Operative Diagnosis:  Vestibular schwannoma  Post Operative Diagnois:  Vestibular schwannoma  3D TREATMENT PLANNING AND DOSIMETRY:  The patient's radiation plan was reviewed and approved by myself (neurosurgery) and Dr. Ledon Snare (radiation oncology) prior to treatment.  It showed 3-dimensional radiation distributions overlaid onto the planning CT/MRI image set.  The [facility] for the target structures as well as the organs at risk were reviewed. The documentation of the 3D plan and dosimetry are filed in the radiation oncology EMR.  NARRATIVE:  Theresa Lester was brought to the TrueBeam stereotactic radiation treatment machine and placed supine on the CT couch. The head frame was applied, and the patient was set up for stereotactic radiosurgery.  I was present for the set-up and delivery.  SIMULATION VERIFICATION:  In the couch zero-angle position, the patient underwent Exactrac imaging using the Brainlab system with orthogonal KV images.  These were carefully aligned and repeated to confirm treatment position for each of the isocenters.  The Exactrac snap film verification was repeated at each couch angle.  SPECIAL TREATMENT PROCEDURE: Theresa Lester received first of five fractions of stereotactic radiosurgery to the following target: Right vestibular schwannoma target was treated using 3 Dynamic Conformal Arcs to a prescription dose of 5 Gy for this first fraction (each fraction will be 5 Gy, for a total dose of 25 Gy).  ExacTrac registration was performed for each couch angle.  The 79.4% isodose line was prescribed.  STEREOTACTIC TREATMENT MANAGEMENT:  Following delivery, the patient was transported to nursing in stable condition and monitored for possible acute effects. The patient tolerated treatment without significant acute effects, and  was discharged to home in stable condition.    PLAN: Follow-up in one month.

## 2015-03-01 NOTE — Progress Notes (Signed)
Received patient and her family in the clinic following the first of five srs treatments. Patient ambulatory. Patient alert and oriented to person, place, and time. No distress noted. Denies headache, dizziness, nausea, or vomiting. Patient without complaints. Patient understands to avoid strenuous activity for the next 24 hours and call [phone] with needs.

## 2015-03-01 NOTE — Progress Notes (Signed)
  Radiation Oncology         [phone]) [phone] ________________________________  Stereotactic Treatment Procedure Note  Name: Theresa Lester MRN: [id number]  Date: 03/01/2015  DOB: December 23, 1970  SPECIAL TREATMENT PROCEDURE    ICD-9-CM ICD-10-CM   1. Unilateral vestibular schwannoma (HCC) 225.1 D33.3     45 yo woman with right intracanicular 7 mm vestibular schwannoma  FRACTION:  1/5  3D TREATMENT PLANNING AND DOSIMETRY:  The patient's radiation plan was reviewed and approved by neurosurgery and radiation oncology prior to treatment.  It showed 3-dimensional radiation distributions overlaid onto the planning CT/MRI image set.  The [facility] for the target structures as well as the organs at risk were reviewed. The documentation of the 3D plan and dosimetry are filed in the radiation oncology EMR.  NARRATIVE:  Theresa Lester was brought to the TrueBeam stereotactic radiation treatment machine and placed supine on the CT couch. The head frame was applied, and the patient was set up for stereotactic radiosurgery.  Neurosurgery was present for the set-up and delivery  SIMULATION VERIFICATION:  In the couch zero-angle position, the patient underwent Exactrac imaging using the Brainlab system with orthogonal KV images.  These were carefully aligned and repeated to confirm treatment position for each of the isocenters.  The Exactrac snap film verification was repeated at each couch angle.  PROCEDURE: Theresa Lester received stereotactic radiosurgery to the following targets:  Right 7 mm acoustic neuroma was treated using 3 Dynamic Conformal Arcs to a prescription dose of 5 Gy in 1 of 5 fractions to a total prescription dose of 25 Gy.  ExacTrac registration was performed for each couch angle.  The 79.4% isodose line was prescribed.  6 MV X-rays were delivered in the flattening filter free beam mode.  STEREOTACTIC TREATMENT MANAGEMENT:  Following delivery, the patient was transported  to nursing in stable condition and monitored for possible acute effects.  Vital signs were recorded BP 137/93 mmHg  Pulse 80  Temp(Src) 98.1 F (36.7 C) (Oral)  Resp 16  SpO2 100%. The patient tolerated treatment without significant acute effects, and was discharged to home in stable condition.    PLAN: Complete 5 fractions and follow up in 1 month.  ________________________________  Sheral Apley. [name], M.D.  This document serves as a record of services personally performed by [name], MD. It was created on his behalf by Darcus Austin, a trained medical scribe. The creation of this record is based on the scribe's personal observations and the provider's statements to them. This document has been checked and approved by the attending provider.

## 2015-03-03 ENCOUNTER — Other Ambulatory Visit: Payer: Self-pay | Admitting: Radiation Oncology

## 2015-03-03 ENCOUNTER — Ambulatory Visit
Admission: RE | Admit: 2015-03-03 | Discharge: 2015-03-03 | Disposition: A | Discharge status: Home / Self Care | Payer: Commercial Managed Care - PPO | Source: Ambulatory Visit | Attending: Radiation Oncology | Admitting: Radiation Oncology

## 2015-03-03 VITALS — BP 129/96 | HR 76 | Temp 97.6°F | Resp 16

## 2015-03-03 DIAGNOSIS — D333 Benign neoplasm of cranial nerves: Secondary | ICD-10-CM

## 2015-03-03 DIAGNOSIS — Z510 Encounter for antineoplastic radiation therapy: Secondary | ICD-10-CM | POA: Diagnosis not present

## 2015-03-03 MED ORDER — DEXAMETHASONE 4 MG PO TABS: 4.0000 mg | ORAL_TABLET | ORAL | Status: DC

## 2015-03-03 NOTE — Addendum Note (Signed)
Encounter addended by: Heywood Footman, RN on: 03/03/2015  5:31 PM<BR>     Documentation filed: Notes Section, Vitals Section

## 2015-03-03 NOTE — Progress Notes (Signed)
Received patient and her family following second srs treatment. Patient ambulatory. Patient alert and oriented to person, place, and time. Reports a headache 9 on a scale of 0-10. Denies dizziness or nausea. Per Dr. [name] provided patient with script for decadron. Directed patient upon use using teach back method. Patient verbalized understanding. Advised about difficulty sleeping, increased appetite, increased reflux and agitation.Patient understands to avoid strenuous activity for the next 24 hours and call [phone] with needs.

## 2015-03-03 NOTE — Addendum Note (Signed)
Encounter addended by: Heywood Footman, RN on: 03/03/2015  5:27 PM<BR>     Documentation filed: Demographics Visit, [place] Section, Orders

## 2015-03-05 ENCOUNTER — Ambulatory Visit: Payer: Commercial Managed Care - PPO | Admitting: Radiation Oncology

## 2015-03-05 ENCOUNTER — Ambulatory Visit
Admission: RE | Admit: 2015-03-05 | Discharge: 2015-03-05 | Disposition: A | Discharge status: Home / Self Care | Payer: Commercial Managed Care - PPO | Source: Ambulatory Visit | Attending: Radiation Oncology | Admitting: Radiation Oncology

## 2015-03-05 VITALS — BP 136/81 | HR 83 | Temp 97.6°F | Resp 18

## 2015-03-05 DIAGNOSIS — D333 Benign neoplasm of cranial nerves: Secondary | ICD-10-CM

## 2015-03-05 DIAGNOSIS — Z510 Encounter for antineoplastic radiation therapy: Secondary | ICD-10-CM | POA: Diagnosis not present

## 2015-03-05 NOTE — Progress Notes (Signed)
Received patient and her family following third srs treatment. Patient ambulatory. Patient alert and oriented to person, place, and time. Denies any headaches since starting decadron. Continues decadron 4 mg bid as directed. No thrush noted. Denies nausea or vomiting. Patient understands to avoid strenuous activity for the next 24 hours and call [phone] with needs.   BP 136/81 mmHg  Pulse 83  Temp(Src) 97.6 F (36.4 C) (Oral)  Resp 18  SpO2 97% Wt Readings from Last 3 Encounters:  02/19/15 185 lb 1.6 oz (83.961 kg)  02/17/15 183 lb 8 oz (83.235 kg)  01/12/15 187 lb 6.4 oz (85.004 kg)

## 2015-03-06 NOTE — Progress Notes (Signed)
  Radiation Oncology         [phone] ________________________________  Stereotactic Treatment Procedure Note  Name: Theresa Lester MRN: [id number]  Date: 03/05/2015  DOB: [birth year]  SPECIAL TREATMENT PROCEDURE    ICD-9-CM ICD-10-CM   1. Unilateral vestibular schwannoma (HCC) 225.1 D33.3     45 yo woman with right intracanicular 7 mm vestibular schwannoma  FRACTION:  3/5  3D TREATMENT PLANNING AND DOSIMETRY:  The patient's radiation plan was reviewed and approved by neurosurgery and radiation oncology prior to treatment.  It showed 3-dimensional radiation distributions overlaid onto the planning CT/MRI image set.  The [facility] for the target structures as well as the organs at risk were reviewed. The documentation of the 3D plan and dosimetry are filed in the radiation oncology EMR.  NARRATIVE:  Theresa Lester was brought to the TrueBeam stereotactic radiation treatment machine and placed supine on the CT couch. The head frame was applied, and the patient was set up for stereotactic radiosurgery.  Neurosurgery was present for the set-up and delivery  SIMULATION VERIFICATION:  In the couch zero-angle position, the patient underwent Exactrac imaging using the Brainlab system with orthogonal KV images.  These were carefully aligned and repeated to confirm treatment position for each of the isocenters.  The Exactrac snap film verification was repeated at each couch angle.  PROCEDURE: Theresa Lester received stereotactic radiosurgery to the following targets:  Right 7 mm acoustic neuroma was treated using 3 Dynamic Conformal Arcs to a prescription dose of 5 Gy in 1 of 5 fractions to a total prescription dose of 25 Gy.  ExacTrac registration was performed for each couch angle.  The 79.4% isodose line was prescribed.  6 MV X-rays were delivered in the flattening filter free beam mode.  STEREOTACTIC TREATMENT MANAGEMENT:  Following delivery, the patient was transported  to nursing in stable condition and monitored for possible acute effects.  Vital signs were recorded BP 136/81 mmHg  Pulse 83  Temp(Src) 97.6 F (36.4 C) (Oral)  Resp 18  SpO2 97%. The patient tolerated treatment without significant acute effects, and was discharged to home in stable condition.    PLAN: Complete 5 fractions and follow up in 1 month.  ________________________________  Sheral Apley. [name], M.D.  This document serves as a record of services personally performed by [name], MD. It was created on his behalf by Darcus Austin, a trained medical scribe. The creation of this record is based on the scribe's personal observations and the provider's statements to them. This document has been checked and approved by the attending provider.

## 2015-03-08 ENCOUNTER — Ambulatory Visit: Payer: Commercial Managed Care - PPO | Admitting: Radiation Oncology

## 2015-03-08 ENCOUNTER — Ambulatory Visit
Admission: RE | Admit: 2015-03-08 | Discharge: 2015-03-08 | Disposition: A | Discharge status: Home / Self Care | Payer: Commercial Managed Care - PPO | Source: Ambulatory Visit | Attending: Radiation Oncology | Admitting: Radiation Oncology

## 2015-03-08 VITALS — BP 133/82 | HR 89 | Temp 97.9°F | Resp 16

## 2015-03-08 DIAGNOSIS — D333 Benign neoplasm of cranial nerves: Secondary | ICD-10-CM

## 2015-03-08 DIAGNOSIS — Z510 Encounter for antineoplastic radiation therapy: Secondary | ICD-10-CM | POA: Diagnosis not present

## 2015-03-08 NOTE — Progress Notes (Signed)
Received patient in nursing following [facility] treatment. Patient accompanied by her husband. A&O x 3. No distress noted. Denies headache, dizziness, nausea, diplopia or ringing in the ears. Continues decadron 4 mg bid as directed. No thrush noted. Understands to begin tapering decadron tomorrow.Patient understands to avoid strenuous activity for the next 24 hours and call [phone] with needs.   Decadron taper instructions below given and reviewed with patient. Patient verbalized understanding.  Dexamethasone (DECADRON) 4 mg tablet  2 mg 2 times daily for 5 days,  Then,  2 mg once daily for 5 days,  Then,  Stop.   Call Sam, RN at [phone] with any regression

## 2015-03-08 NOTE — Progress Notes (Signed)
  Radiation Oncology         (336) [phone] ________________________________  Stereotactic Treatment Procedure Note  Name: Theresa Lester MRN: [id number]  Date: 03/08/2015  DOB: 1970-10-26  SPECIAL TREATMENT PROCEDURE    ICD-9-CM ICD-10-CM   1. Unilateral vestibular schwannoma (HCC) 225.1 D33.3     45 yo woman with right intracanicular 7 mm vestibular schwannoma  FRACTION:  4/5  3D TREATMENT PLANNING AND DOSIMETRY:  The patient's radiation plan was reviewed and approved by neurosurgery and radiation oncology prior to treatment.  It showed 3-dimensional radiation distributions overlaid onto the planning CT/MRI image set.  The [facility] for the target structures as well as the organs at risk were reviewed. The documentation of the 3D plan and dosimetry are filed in the radiation oncology EMR.  NARRATIVE:  Theresa Lester was brought to the TrueBeam stereotactic radiation treatment machine and placed supine on the CT couch. The head frame was applied, and the patient was set up for stereotactic radiosurgery.  Neurosurgery was present for the set-up and delivery  SIMULATION VERIFICATION:  In the couch zero-angle position, the patient underwent Exactrac imaging using the Brainlab system with orthogonal KV images.  These were carefully aligned and repeated to confirm treatment position for each of the isocenters.  The Exactrac snap film verification was repeated at each couch angle.  PROCEDURE: Theresa Lester received stereotactic radiosurgery to the following targets:  Right 7 mm acoustic neuroma was treated using 3 Dynamic Conformal Arcs to a prescription dose of 5 Gy in 1 of 5 fractions to a total prescription dose of 25 Gy.  ExacTrac registration was performed for each couch angle.  The 79.4% isodose line was prescribed.  6 MV X-rays were delivered in the flattening filter free beam mode.  STEREOTACTIC TREATMENT MANAGEMENT:  Following delivery, the patient was transported  to nursing in stable condition and monitored for possible acute effects.  Vital signs were recorded BP 133/82 mmHg  Pulse 89  Temp(Src) 97.9 F (36.6 C) (Oral)  Resp 16  SpO2 100%. The patient tolerated treatment without significant acute effects, and was discharged to home in stable condition.    PLAN: Complete 5 fractions and follow up in 1 month.  ________________________________  Sheral Apley. [name], M.D.    This document serves as a record of services personally performed by [name], MD. It was created on his behalf by Lendon Collar, a trained medical scribe. The creation of this record is based on the scribe's personal observations and the provider's statements to them. This document has been checked and approved by the attending provider.

## 2015-03-10 ENCOUNTER — Encounter: Payer: Self-pay | Admitting: Radiation Oncology

## 2015-03-10 ENCOUNTER — Ambulatory Visit
Admission: RE | Admit: 2015-03-10 | Discharge: 2015-03-10 | Disposition: A | Discharge status: Home / Self Care | Payer: Commercial Managed Care - PPO | Source: Ambulatory Visit | Attending: Radiation Oncology | Admitting: Radiation Oncology

## 2015-03-10 ENCOUNTER — Ambulatory Visit: Payer: Commercial Managed Care - PPO | Admitting: Radiation Oncology

## 2015-03-10 VITALS — BP 129/87 | HR 74 | Temp 97.9°F

## 2015-03-10 DIAGNOSIS — D333 Benign neoplasm of cranial nerves: Secondary | ICD-10-CM

## 2015-03-10 DIAGNOSIS — Z510 Encounter for antineoplastic radiation therapy: Secondary | ICD-10-CM | POA: Diagnosis not present

## 2015-03-10 NOTE — Progress Notes (Signed)
Patient here for post [facility] monitoring.  She denies having pain or headache.  She is alert and oriented to person, place and time.  She was given a decadron taper on 03/08/15.  She does not have signs of thrush.  She has been given a one month follow up appointment card and has been instructed to call with any questions or concerns.

## 2015-03-14 NOTE — Progress Notes (Signed)
  Radiation Oncology         (336) [phone] ________________________________  Stereotactic Treatment Procedure Note  Name: Theresa Lester MRN: [id number]  Date: 03/10/2015  DOB: [birth year]  SPECIAL TREATMENT PROCEDURE    ICD-9-CM ICD-10-CM   1. Unilateral vestibular schwannoma (HCC) 225.1 D33.3     45 yo woman with right intracanicular 7 mm vestibular schwannoma  FRACTION:  5/5  3D TREATMENT PLANNING AND DOSIMETRY:  The patient's radiation plan was reviewed and approved by neurosurgery and radiation oncology prior to treatment.  It showed 3-dimensional radiation distributions overlaid onto the planning CT/MRI image set.  The [facility] for the target structures as well as the organs at risk were reviewed. The documentation of the 3D plan and dosimetry are filed in the radiation oncology EMR.  NARRATIVE:  Theresa Lester was brought to the TrueBeam stereotactic radiation treatment machine and placed supine on the CT couch. The head frame was applied, and the patient was set up for stereotactic radiosurgery.  Neurosurgery was present for the set-up and delivery  SIMULATION VERIFICATION:  In the couch zero-angle position, the patient underwent Exactrac imaging using the Brainlab system with orthogonal KV images.  These were carefully aligned and repeated to confirm treatment position for each of the isocenters.  The Exactrac snap film verification was repeated at each couch angle.  PROCEDURE: Theresa Lester received stereotactic radiosurgery to the following targets:  Right 7 mm acoustic neuroma was treated using 3 Dynamic Conformal Arcs to a prescription dose of 5 Gy in 1 of 5 fractions to a total prescription dose of 25 Gy.  ExacTrac registration was performed for each couch angle.  The 79.4% isodose line was prescribed.  6 MV X-rays were delivered in the flattening filter free beam mode.  STEREOTACTIC TREATMENT MANAGEMENT:  Following delivery, the patient was transported  to nursing in stable condition and monitored for possible acute effects.  Vital signs were recorded BP 129/87 mmHg  Pulse 74  Temp(Src) 97.9 F (36.6 C) (Oral). The patient tolerated treatment without significant acute effects, and was discharged to home in stable condition.    PLAN: Complete treatment today and follow up in 1 month.  ________________________________  Sheral Apley. [name], M.D.

## 2015-03-15 NOTE — Progress Notes (Signed)
  Radiation Oncology         [phone] ________________________________  Name: Theresa Lester MRN: [id number]  Date: 03/10/2015  DOB: 01-19-1970  End of Treatment Note   ICD-9-CM ICD-10-CM    1. Unilateral vestibular schwannoma (HCC) 225.1 D33.3        Indication for treatment:  Curative, Hearing Preservation       Radiation treatment dates:  03/01/2015-03/10/2015  Site/dose/beams/energy:   Right 7 mm acoustic neuroma was treated using 3 Dynamic Conformal Arcs to a prescription dose of 5 Gy in 5 fractions to a total prescription dose of 25 Gy.  ExacTrac registration was performed for each couch angle.  The 79.4% isodose line was prescribed.  6 MV X-rays were delivered in the flattening filter free beam mode.  Narrative: The patient tolerated radiation treatment relatively well.     Plan: The patient has completed radiation treatment. The patient will return to radiation oncology clinic for routine followup in one month. I advised her to call or return sooner if she has any questions or concerns related to her recovery or treatment. ________________________________  Sheral Apley. [name], M.D.

## 2015-04-14 ENCOUNTER — Encounter: Payer: Self-pay | Admitting: Internal Medicine

## 2015-04-14 ENCOUNTER — Ambulatory Visit (INDEPENDENT_AMBULATORY_CARE_PROVIDER_SITE_OTHER): Payer: Commercial Managed Care - PPO | Admitting: Internal Medicine

## 2015-04-14 VITALS — BP 114/70 | HR 84 | Ht 63.0 in | Wt 186.0 lb

## 2015-04-14 DIAGNOSIS — G44219 Episodic tension-type headache, not intractable: Secondary | ICD-10-CM | POA: Diagnosis not present

## 2015-04-14 DIAGNOSIS — H6981 Other specified disorders of Eustachian tube, right ear: Secondary | ICD-10-CM

## 2015-04-14 DIAGNOSIS — H6991 Unspecified Eustachian tube disorder, right ear: Secondary | ICD-10-CM

## 2015-04-14 DIAGNOSIS — D333 Benign neoplasm of cranial nerves: Secondary | ICD-10-CM | POA: Diagnosis not present

## 2015-04-14 MED ORDER — MECLIZINE HCL 25 MG PO TABS: 25.0000 mg | ORAL_TABLET | Freq: Three times a day (TID) | ORAL | Status: DC | PRN

## 2015-04-14 MED ORDER — FLUTICASONE PROPIONATE 50 MCG/ACT NA SUSP: 2.0000 | Freq: Every day | NASAL | Status: DC

## 2015-04-14 NOTE — Progress Notes (Signed)
Date:  04/14/2015   Name:  Theresa Lester   DOB:  May 26, 1970   MRN:  [id number]   Chief Complaint: Headache and Otalgia Headache  This is a recurrent problem. Associated symptoms include dizziness and ear pain. Pertinent negatives include no coughing, fever, rhinorrhea, sinus pressure, sore throat or weakness.  Otalgia  There is pain in the right ear. This is a new problem. The current episode started in the past 7 days. The problem occurs constantly. There has been no fever. The pain is mild. Associated symptoms include headaches. Pertinent negatives include no coughing, ear discharge, rhinorrhea or sore throat.  Vertigo - started several days ago.  No syncope or vomiting.  She denies any URI or allergy symptoms.  Her Rad Onc does not think this is sequelae from Radiosurgery.    Review of Systems  Constitutional: Negative for fever.  HENT: Positive for ear pain. Negative for ear discharge, postnasal drip, rhinorrhea, sinus pressure and sore throat.   Respiratory: Negative for cough and chest tightness.   Cardiovascular: Negative for chest pain.  Neurological: Positive for dizziness, light-headedness and headaches. Negative for syncope and weakness.    Patient Active Problem List   Diagnosis Date Noted  . Episodic tension-type headache, not intractable 04/14/2015  . Unilateral vestibular schwannoma ([place]) 01/12/2015  . Nail avulsion of toe 01/12/2015  . Speech abnormality 11/25/2014  . Tinnitus 11/25/2014  . Mixed migraine and muscle contraction headache 10/20/2014  . Angioneurotic edema 10/19/2014  . Arthralgia of hip or thigh 10/19/2014  . Chapped lips 10/19/2014  . Episodic headache 10/19/2014  . Calculus of gallbladder 10/19/2014  . Acid reflux 10/19/2014  . Blood glucose elevated 10/19/2014  . Hemorrhoids, internal 10/19/2014  . Restless leg 10/19/2014  . Fast heart beat 10/19/2014    Prior to Admission medications   Medication Sig Start Date End Date  Taking? Authorizing Provider  bismuth subsalicylate (PEPTO BISMOL) 262 MG/15ML suspension Take 30 mLs by mouth every 6 (six) hours as needed. Reported on 02/17/2015   Yes Historical Provider, MD  butalbital-acetaminophen-caffeine (FIORICET, ESGIC) 50-325-40 MG tablet Take 1 tablet by mouth 2 (two) times daily as needed for headache or migraine. 11/25/14  Yes [name], MD  cyclobenzaprine (FLEXERIL) 10 MG tablet TAKE 0.5 TABLETS (5 MG TOTAL) BY MOUTH 2 (TWO) TIMES DAILY AS NEEDED FOR MUSCLE SPASMS. 01/31/15  Yes [name], MD  triamcinolone (KENALOG) 0.025 % ointment Apply 1 application topically 2 (two) times daily. 10/21/14  Yes [name], MD    No Known Allergies  Past Surgical History  Procedure Laterality Date  . Bilateral carpal tunnel release    . Tubal ligation      Social History  Substance Use Topics  . Smoking status: Never Smoker   . Smokeless tobacco: Never Used  . Alcohol Use: No    Medication list has been reviewed and updated.   Physical Exam  Constitutional: She is oriented to person, place, and time. She appears well-developed and well-nourished. No distress.  HENT:  Head: Normocephalic and atraumatic.  Right Ear: Ear canal normal. Tympanic membrane is retracted. Tympanic membrane is not erythematous.  Left Ear: Ear canal normal. Tympanic membrane is not erythematous and not retracted.  Nose: Right sinus exhibits no maxillary sinus tenderness and no frontal sinus tenderness. Left sinus exhibits no maxillary sinus tenderness and no frontal sinus tenderness.  Mouth/Throat: Oropharynx is clear and moist.  Eyes: Conjunctivae are normal. Pupils are equal, round, and reactive to  light. Right eye exhibits nystagmus.  Neck: Normal range of motion. Neck supple. No thyromegaly present.  Cardiovascular: Normal rate, regular rhythm and normal heart sounds.   Pulmonary/Chest: Effort normal and breath sounds normal. No respiratory distress. She has no wheezes.    Musculoskeletal: Normal range of motion.  Neurological: She is alert and oriented to person, place, and time.  Skin: Skin is warm and dry. No rash noted.  Psychiatric: She has a normal mood and affect. Her behavior is normal. Thought content normal.  Nursing note and vitals reviewed.   BP 114/70 mmHg  Pulse 84  Ht 5\' 3"  (1.6 m)  Wt 186 lb (84.369 kg)  BMI 32.96 kg/m2  LMP 03/31/2015  Assessment and Plan: 1. Episodic tension-type headache, not intractable Continue Fioricet as needed  2. Eustachian tube dysfunction, right Likely the cause of vertigo vs viral - should resolve over the next week - fluticasone (FLONASE) 50 MCG/ACT nasal spray; Place 2 sprays into both nostrils daily.  Dispense: 16 g; Refill: 6 - meclizine (ANTIVERT) 25 MG tablet; Take 1 tablet (25 mg total) by mouth 3 (three) times daily as needed.  Dispense: 20 tablet; Refill: 0  3. Unilateral vestibular schwannoma [facility]) Completed a course of Radiosurgery.  Follow up tomorrow with Rad Onc specialist   Halina Maidens, MD [facility]  04/14/2015

## 2015-04-14 NOTE — Patient Instructions (Signed)

## 2015-04-15 ENCOUNTER — Ambulatory Visit
Admission: RE | Admit: 2015-04-15 | Discharge: 2015-04-15 | Disposition: A | Discharge status: Home / Self Care | Payer: Commercial Managed Care - PPO | Source: Ambulatory Visit | Attending: Radiation Oncology | Admitting: Radiation Oncology

## 2015-04-15 ENCOUNTER — Encounter: Payer: Self-pay | Admitting: Radiation Oncology

## 2015-04-15 VITALS — BP 124/89 | HR 86 | Resp 16 | Wt 187.0 lb

## 2015-04-15 DIAGNOSIS — D333 Benign neoplasm of cranial nerves: Secondary | ICD-10-CM

## 2015-04-15 NOTE — Progress Notes (Signed)
Radiation Oncology         [phone]) [phone] ________________________________  Name: Theresa Lester MRN: [id number]  Date: 04/15/2015   DOB: Aug 20, 1970  Follow-Up Visit Note    CC: Halina Maidens, MD  [name], MD  Diagnosis:   45 yo woman with unilateral 7 mm vestibular schwannoma.    ICD-9-CM ICD-10-CM   1. Unilateral vestibular schwannoma (HCC) 225.1 D33.3     Interval Since Last Radiation:  1 month. Completed radiation 03/01/2015-03/10/2015 to the right acoustic neuroma.  Site/dose/beams/energy:   Right 7 mm acoustic neuroma was treated using 3 Dynamic Conformal Arcs to a prescription dose of 5 Gy in 5 fractions to a total prescription dose of 25 Gy.  ExacTrac registration was performed for each couch angle.  The 79.4% isodose line was prescribed.  6 MV X-rays were delivered in the flattening filter free beam mode.  Narrative:  The patient returns today for routine follow-up.  Weight and vitals stable. Reports a right ear ache since Saturday. Reports she was prescribed antivert and flonase by her ENT. Reports she continues to take [place] for headaches. Reports on average she experiences 2-3 headaches per week right temporal to parietal area. Reports occasional ringing in her ears. Reports an unsteady gait recently which she associates with the ear ache. Reports dizziness. Denies diplopia, nausea or vomiting. Husband reports the patient has been doing great overall until the presentation of her ear ache on Saturday. She reports that hearing and ringing are about the same.  ALLERGIES:  has No Known Allergies.  Meds: Current Outpatient Prescriptions  Medication Sig Dispense Refill  . bismuth subsalicylate (PEPTO BISMOL) 262 MG/15ML suspension Take 30 mLs by mouth every 6 (six) hours as needed. Reported on 02/17/2015    . butalbital-acetaminophen-caffeine (FIORICET, ESGIC) 50-325-40 MG tablet Take 1 tablet by mouth 2 (two) times daily as needed for headache or migraine. 60  tablet 1  . cyclobenzaprine (FLEXERIL) 10 MG tablet TAKE 0.5 TABLETS (5 MG TOTAL) BY MOUTH 2 (TWO) TIMES DAILY AS NEEDED FOR MUSCLE SPASMS. 30 tablet 1  . fluticasone (FLONASE) 50 MCG/ACT nasal spray Place 2 sprays into both nostrils daily. 16 g 6  . meclizine (ANTIVERT) 25 MG tablet Take 1 tablet (25 mg total) by mouth 3 (three) times daily as needed. 20 tablet 0  . triamcinolone (KENALOG) 0.025 % ointment Apply 1 application topically 2 (two) times daily. 15 g 0   No current facility-administered medications for this encounter.    Physical Findings: The patient is in no acute distress. Patient is alert and oriented.  weight is 187 lb (84.823 kg). Her blood pressure is 124/89 and her pulse is 86. Her respiration is 16 and oxygen saturation is 100%. Marland Kitchen  Hearing intact.  No significant changes.  Lab Findings: No results found for: WBC, HGB, HCT, PLT  Lab Results  Component Value Date   BUN 13.0 02/17/2015   CREATININE 0.7 02/17/2015    Radiographic Findings: No results found.  Impression:  The patient is recovering from the effects of radiation.  Plan:  She will be scheduled for a follow up base-line MRI in May and she will follow up Dr. [name] and myself.  _____________________________________  Sheral Apley. [name], M.D.    This document serves as a record of services personally performed by [name], MD. It was created on his behalf by Lendon Collar, a trained medical scribe. The creation of this record is based on the scribe's personal observations and the provider's statements  to them. This document has been checked and approved by the attending provider.

## 2015-04-15 NOTE — Progress Notes (Signed)
Weight and vitals stable. Reports a right ear ache since Saturday. Reports she was prescribed antivert and flonase by her ENT. Reports she continues to take [place] for headaches. Reports on average she experiences 2-3 headaches per week right temporal to parietal area. Reports occasional ringing in her ears. Reports an unsteady gait recently which she associates with the ear ache. Reports dizziness. Denies diplopia, nausea or vomiting. Husband reports the patient has been doing great overall until the presentation of her ear ache on Saturday.   BP 124/89 mmHg  Pulse 86  Resp 16  Wt 187 lb (84.823 kg)  SpO2 100%  LMP 03/31/2015 Wt Readings from Last 3 Encounters:  04/15/15 187 lb (84.823 kg)  04/14/15 186 lb (84.369 kg)  02/19/15 185 lb 1.6 oz (83.961 kg)

## 2015-04-16 ENCOUNTER — Telehealth: Payer: Self-pay | Admitting: Radiation Oncology

## 2015-04-16 NOTE — Telephone Encounter (Signed)
Received message from patient requesting a return call. Phoned patient at [phone]. No answer. Left message requesting return call.

## 2015-04-16 NOTE — Telephone Encounter (Signed)
Opened in error

## 2015-04-16 NOTE — Telephone Encounter (Addendum)
Patient returned call. Patient understands she is scheduled to follow up with Dr. [name] on 06/08/15. Also, patient understands she is to have an MRI. Patient is questioning if she arranges the MRI. Explained [facility] D[facility] will arrange MRI and call her with the appointment. She verbalized understanding and expressed appreciation for the call.

## 2015-04-27 ENCOUNTER — Other Ambulatory Visit: Payer: Self-pay | Admitting: Radiation Therapy

## 2015-04-27 DIAGNOSIS — D333 Benign neoplasm of cranial nerves: Secondary | ICD-10-CM

## 2015-04-28 ENCOUNTER — Other Ambulatory Visit: Payer: Self-pay | Admitting: Radiation Oncology

## 2015-04-28 DIAGNOSIS — D333 Benign neoplasm of cranial nerves: Secondary | ICD-10-CM

## 2015-04-28 MED ORDER — LORAZEPAM 0.5 MG PO TABS: ORAL_TABLET | ORAL | Status: DC

## 2015-04-30 ENCOUNTER — Other Ambulatory Visit: Payer: Self-pay | Admitting: Internal Medicine

## 2015-06-10 ENCOUNTER — Ambulatory Visit: Payer: Self-pay | Admitting: Radiation Oncology

## 2015-06-11 ENCOUNTER — Emergency Department: Payer: Commercial Managed Care - PPO

## 2015-06-11 ENCOUNTER — Observation Stay
Admission: EM | Admit: 2015-06-11 | Discharge: 2015-06-12 | Disposition: A | Discharge status: Home / Self Care | Payer: Commercial Managed Care - PPO | Attending: Internal Medicine | Admitting: Internal Medicine

## 2015-06-11 ENCOUNTER — Encounter: Payer: Self-pay | Admitting: Emergency Medicine

## 2015-06-11 DIAGNOSIS — Z8249 Family history of ischemic heart disease and other diseases of the circulatory system: Secondary | ICD-10-CM | POA: Insufficient documentation

## 2015-06-11 DIAGNOSIS — Z833 Family history of diabetes mellitus: Secondary | ICD-10-CM | POA: Diagnosis not present

## 2015-06-11 DIAGNOSIS — R000 Tachycardia, unspecified: Secondary | ICD-10-CM | POA: Insufficient documentation

## 2015-06-11 DIAGNOSIS — R29810 Facial weakness: Secondary | ICD-10-CM | POA: Diagnosis not present

## 2015-06-11 DIAGNOSIS — G2581 Restless legs syndrome: Secondary | ICD-10-CM | POA: Insufficient documentation

## 2015-06-11 DIAGNOSIS — D333 Benign neoplasm of cranial nerves: Secondary | ICD-10-CM | POA: Insufficient documentation

## 2015-06-11 DIAGNOSIS — I371 Nonrheumatic pulmonary valve insufficiency: Secondary | ICD-10-CM | POA: Diagnosis not present

## 2015-06-11 DIAGNOSIS — R55 Syncope and collapse: Secondary | ICD-10-CM | POA: Diagnosis not present

## 2015-06-11 DIAGNOSIS — R200 Anesthesia of skin: Secondary | ICD-10-CM | POA: Diagnosis present

## 2015-06-11 DIAGNOSIS — H6981 Other specified disorders of Eustachian tube, right ear: Secondary | ICD-10-CM

## 2015-06-11 DIAGNOSIS — Z7982 Long term (current) use of aspirin: Secondary | ICD-10-CM | POA: Insufficient documentation

## 2015-06-11 DIAGNOSIS — K219 Gastro-esophageal reflux disease without esophagitis: Secondary | ICD-10-CM | POA: Insufficient documentation

## 2015-06-11 DIAGNOSIS — Z79899 Other long term (current) drug therapy: Secondary | ICD-10-CM | POA: Diagnosis not present

## 2015-06-11 DIAGNOSIS — R51 Headache: Secondary | ICD-10-CM | POA: Insufficient documentation

## 2015-06-11 DIAGNOSIS — G459 Transient cerebral ischemic attack, unspecified: Secondary | ICD-10-CM | POA: Diagnosis not present

## 2015-06-11 DIAGNOSIS — G513 Clonic hemifacial spasm: Secondary | ICD-10-CM | POA: Diagnosis not present

## 2015-06-11 DIAGNOSIS — G5139 Clonic hemifacial spasm, unspecified: Secondary | ICD-10-CM | POA: Insufficient documentation

## 2015-06-11 DIAGNOSIS — I071 Rheumatic tricuspid insufficiency: Secondary | ICD-10-CM | POA: Diagnosis not present

## 2015-06-11 DIAGNOSIS — I639 Cerebral infarction, unspecified: Secondary | ICD-10-CM

## 2015-06-11 DIAGNOSIS — K648 Other hemorrhoids: Secondary | ICD-10-CM | POA: Insufficient documentation

## 2015-06-11 LAB — PROTIME-INR
INR: 0.99
Prothrombin Time: 13.3 seconds (ref 11.4–15.0)

## 2015-06-11 LAB — COMPREHENSIVE METABOLIC PANEL
ALT: 23 U/L (ref 14–54)
AST: 21 U/L (ref 15–41)
Albumin: 4.3 g/dL (ref 3.5–5.0)
Alkaline Phosphatase: 73 U/L (ref 38–126)
Anion gap: 6 (ref 5–15)
BUN: 16 mg/dL (ref 6–20)
CO2: 25 mmol/L (ref 22–32)
Calcium: 8.6 mg/dL — ABNORMAL LOW (ref 8.9–10.3)
Chloride: 107 mmol/L (ref 101–111)
Creatinine, Ser: 0.62 mg/dL (ref 0.44–1.00)
GFR calc Af Amer: >60 mL/min (ref >60)
GFR calc non Af Amer: >60 mL/min (ref >60)
Glucose, Bld: 104 mg/dL — ABNORMAL HIGH (ref 65–99)
Potassium: 3.7 mmol/L (ref 3.5–5.1)
Sodium: 138 mmol/L (ref 135–145)
Total Bilirubin: 0.2 mg/dL — ABNORMAL LOW (ref 0.3–1.2)
Total Protein: 7.4 g/dL (ref 6.5–8.1)

## 2015-06-11 LAB — CBC
HCT: 40.9 % (ref 35.0–47.0)
Hemoglobin: 13.8 g/dL (ref 12.0–16.0)
MCH: 29.4 pg (ref 26.0–34.0)
MCHC: 33.7 g/dL (ref 32.0–36.0)
MCV: 87.4 fL (ref 80.0–100.0)
Platelets: 270 10*3/uL (ref 150–440)
RBC: 4.68 MIL/uL (ref 3.80–5.20)
RDW: 13.4 % (ref 11.5–14.5)
WBC: 7.4 10*3/uL (ref 3.6–11.0)

## 2015-06-11 LAB — GLUCOSE, CAPILLARY: Glucose-Capillary: 123 mg/dL — ABNORMAL HIGH (ref 65–99)

## 2015-06-11 LAB — DIFFERENTIAL
Basophils Absolute: 0.1 10*3/uL (ref 0–0.1)
Basophils Relative: 1 %
Eosinophils Absolute: 0.2 10*3/uL (ref 0–0.7)
Eosinophils Relative: 3 %
Lymphocytes Relative: 23 %
Lymphs Abs: 1.7 10*3/uL (ref 1.0–3.6)
Monocytes Absolute: 0.6 10*3/uL (ref 0.2–0.9)
Monocytes Relative: 8 %
Neutro Abs: 4.8 10*3/uL (ref 1.4–6.5)
Neutrophils Relative %: 65 %

## 2015-06-11 LAB — APTT: aPTT: 30 seconds (ref 24–36)

## 2015-06-11 LAB — TROPONIN I: Troponin I: <0.03 ng/mL (ref <0.031)

## 2015-06-11 MED ORDER — ONDANSETRON HCL 4 MG PO TABS: 4.0000 mg | ORAL_TABLET | Freq: Four times a day (QID) | ORAL | Status: DC | PRN

## 2015-06-11 MED ORDER — ASPIRIN 81 MG PO CHEW
81.0000 mg | CHEWABLE_TABLET | Freq: Every day | ORAL | Status: DC
  Administered 2015-06-11 – 2015-06-12 (×2): 81 mg via ORAL
  Filled 2015-06-11 (×2): qty 1

## 2015-06-11 MED ORDER — FLUTICASONE PROPIONATE 50 MCG/ACT NA SUSP
2.0000 | Freq: Every day | NASAL | Status: DC
  Filled 2015-06-11: qty 16

## 2015-06-11 MED ORDER — ONDANSETRON HCL 4 MG/2ML IJ SOLN: 4.0000 mg | Freq: Four times a day (QID) | INTRAMUSCULAR | Status: DC | PRN

## 2015-06-11 MED ORDER — ACETAMINOPHEN 325 MG PO TABS
650.0000 mg | ORAL_TABLET | Freq: Four times a day (QID) | ORAL | Status: DC | PRN
  Administered 2015-06-11 – 2015-06-12 (×2): 650 mg via ORAL
  Filled 2015-06-11 (×2): qty 2

## 2015-06-11 MED ORDER — ENOXAPARIN SODIUM 40 MG/0.4ML ~~LOC~~ SOLN: 40.0000 mg | SUBCUTANEOUS | Status: DC

## 2015-06-11 MED ORDER — CYCLOBENZAPRINE HCL 10 MG PO TABS: 5.0000 mg | ORAL_TABLET | Freq: Three times a day (TID) | ORAL | Status: DC | PRN

## 2015-06-11 MED ORDER — ACETAMINOPHEN 650 MG RE SUPP: 650.0000 mg | Freq: Four times a day (QID) | RECTAL | Status: DC | PRN

## 2015-06-11 MED ORDER — HYDROCODONE-ACETAMINOPHEN 5-325 MG PO TABS: 1.0000 | ORAL_TABLET | ORAL | Status: DC | PRN

## 2015-06-11 NOTE — Consult Note (Signed)
Code Stroke page to support patient and her daughter; suspected stroke.  Chaplain offered pastoral presence, empathetic listening and information on hospital services (dining, etc) to daughter.  Pt will be admitted overnight for observation, so they are concerned.  Offered to visit again later if they are in need of further support. Also offered to contact additional family, pastor; they denied need at this time.

## 2015-06-11 NOTE — ED Notes (Signed)
Pt denies numbness to face/extremities

## 2015-06-11 NOTE — ED Notes (Signed)
Patient arrived to room 15 in ER at approximately 1738 for c/o stroke-like symptoms. Patient c/o right arm numbness and right sided facial tingling with right eye twitching. States symptoms have now resolved. Denies any other symptoms or unilateral weakness. Denies any malaise or illness in recent days.

## 2015-06-11 NOTE — ED Provider Notes (Addendum)
[facility] Emergency Department Provider Note  ____________________________________________   I have reviewed the triage vital signs and the nursing notes.   HISTORY  Chief Complaint No chief complaint on file.    HPI Theresa Lester is a 45 y.o. female with a history of acoustic neuroma chronic daily headaches and gait disturbance as a result. She is no longer any radiation. She states that yesterday she had a small period of time when the area around her right eye seemed to be "drawn". That resolved and then today around 4:30 she started having right-sided facial tingling and it felt like he was pulling back as well as right upper extremity tingling. Patient denies any other focal deficits. At this time she feels that she is back to baseline with no ongoing neurologic symptoms. She denies any significant headaches really no headache over her baseline states today's a "good headache day". She denies any fever or chills. The talking or walking or any other neurologic or physical complaint.     Past Medical History  Diagnosis Date  . Acoustic neuroma ([place])   . GERD (gastroesophageal reflux disease)   . Calculus of gallbladder   . Hemorrhoids, internal   . Restless leg     Patient Active Problem List   Diagnosis Date Noted  . Episodic tension-type headache, not intractable 04/14/2015  . Unilateral vestibular schwannoma ([place]) 01/12/2015  . Nail avulsion of toe 01/12/2015  . Speech abnormality 11/25/2014  . Tinnitus 11/25/2014  . Mixed migraine and muscle contraction headache 10/20/2014  . Angioneurotic edema 10/19/2014  . Arthralgia of hip or thigh 10/19/2014  . Chapped lips 10/19/2014  . Calculus of gallbladder 10/19/2014  . Acid reflux 10/19/2014  . Blood glucose elevated 10/19/2014  . Hemorrhoids, internal 10/19/2014  . Restless leg 10/19/2014  . Fast heart beat 10/19/2014    Past Surgical History  Procedure Laterality Date  .  Bilateral carpal tunnel release    . Tubal ligation      Current Outpatient Rx  Name  Route  Sig  Dispense  Refill  . bismuth subsalicylate (PEPTO BISMOL) 262 MG/15ML suspension   Oral   Take 30 mLs by mouth every 6 (six) hours as needed. Reported on 02/17/2015         . butalbital-acetaminophen-caffeine (FIORICET, ESGIC) 50-325-40 MG tablet   Oral   Take 1 tablet by mouth 2 (two) times daily as needed for headache or migraine.   60 tablet   1   . cyclobenzaprine (FLEXERIL) 10 MG tablet      TAKE 0.5 TABLETS (5 MG TOTAL) BY MOUTH 2 (TWO) TIMES DAILY AS NEEDED FOR MUSCLE SPASMS.   30 tablet   3   . fluticasone (FLONASE) 50 MCG/ACT nasal spray   Each Nare   Place 2 sprays into both nostrils daily.   16 g   6   . LORazepam (ATIVAN) 0.5 MG tablet      Take one tab po 30 minutes prior to MRI   20 tablet   0   . meclizine (ANTIVERT) 25 MG tablet   Oral   Take 1 tablet (25 mg total) by mouth 3 (three) times daily as needed.   20 tablet   0   . triamcinolone (KENALOG) 0.025 % ointment   Topical   Apply 1 application topically 2 (two) times daily.   15 g   0     Allergies Review of patient's allergies indicates no known allergies.  Family History  Problem Relation Age of Onset  . Hypertension Mother   . Diabetes Mother     Social History Social History  Substance Use Topics  . Smoking status: Never Smoker   . Smokeless tobacco: Never Used  . Alcohol Use: No    Review of Systems Constitutional: No fever/chills Eyes: No visual changes. ENT: No sore throat. No stiff neck no neck pain Cardiovascular: Denies chest pain. Respiratory: Denies shortness of breath. Gastrointestinal:   no vomiting.  No diarrhea.  No constipation. Genitourinary: Negative for dysuria. Musculoskeletal: Negative lower extremity swelling Skin: Negative for rash. Neurological: See history of present illness 10-point ROS otherwise  negative.  ____________________________________________   PHYSICAL EXAM:  VITAL SIGNS: ED Triage Vitals  Enc Vitals Group     BP 06/11/15 1740 153/103 mmHg     Pulse Rate 06/11/15 1740 108     Resp 06/11/15 1740 20     Temp 06/11/15 1740 98.9 F (37.2 C)     Temp Source 06/11/15 1740 Oral     SpO2 06/11/15 1740 99 %     Weight 06/11/15 1740 187 lb (84.823 kg)     Height 06/11/15 1740 5\' 3"  (1.6 m)     Head Cir --      Peak Flow --      Pain Score --      Pain Loc --      Pain Edu? --      Excl. in [employer]? --     Constitutional: Alert and oriented. Well appearing and in no acute distress. Eyes: Conjunctivae are normal. PERRL. EOMI. Head: Atraumatic. Nose: No congestion/rhinnorhea. Mouth/Throat: Mucous membranes are moist.  Oropharynx non-erythematous. Neck: No stridor.   Nontender with no meningismus Cardiovascular: Normal rate, regular rhythm. Grossly normal heart sounds.  Good peripheral circulation. Respiratory: Normal respiratory effort.  No retractions. Lungs CTAB. Abdominal: Soft and nontender. No distention. No guarding no rebound Back:  There is no focal tenderness or step off there is no midline tenderness there are no lesions noted. there is no CVA tenderness Musculoskeletal: No lower extremity tenderness. No joint effusions, no DVT signs strong distal pulses no edema Neurologic: Cranial nerves II through XII are grossly intact 5 out of 5 strength bilateral upper and lower extremity. Finger to nose within normal limits heel to shin within normal limits, speech is normal with no word finding difficulty or dysarthria, reflexes symmetric, pupils are equally round and reactive to light, there is no pronator drift, sensation is normal, vision is intact to confrontation, gait is deferred, there is no nystagmus, normal neurologic exam  Skin:  Skin is warm, dry and intact. No rash noted. Psychiatric: Mood and affect are normal. Speech and behavior are  normal.  ____________________________________________   LABS (all labs ordered are listed, but only abnormal results are displayed)  Labs Reviewed  PROTIME-INR  APTT  CBC  DIFFERENTIAL  COMPREHENSIVE METABOLIC PANEL  TROPONIN I  CBG MONITORING, ED   ____________________________________________  EKG  I personally interpreted any EKGs ordered by me or triage Sinus tach rate 106 no acute ST elevation or acute ST depression normal axis unremarkable EKG ____________________________________________  RADIOLOGY  I reviewed any imaging ordered by me or triage that were performed during my shift and, if possible, patient and/or family made aware of any abnormal findings. ____________________________________________   PROCEDURES  Procedure(s) performed: None  Critical Care performed: None  ____________________________________________   INITIAL IMPRESSION / ASSESSMENT AND PLAN / ED COURSE  Pertinent labs & imaging results that  were available during my care of the patient were reviewed by me and considered in my medical decision making (see chart for details).  Patient with signs of a possible CVA earlier today at 4:30, however, this time she is back to her neurologic baseline and NIH stroke scale is 0. She is therefore not a candidate for TPA. We will observe her closely in the emergency department and patient likely will need further evaluation  ----------------------------------------- 6:02 PM on 06/11/2015 -----------------------------------------  Patient was evaluated by neurology they do not feel the patient is a candidate for TPA and they would like her admitted. Patient did have some "twitching" but there is no focal seizure activity noted. Patient does correct herself and states that her symptoms actually started at 3:30 not for 30 which of course is abuse but in any event she is not a candidate for TPA cancer NIH stroke scale is 0..   FINAL CLINICAL IMPRESSION(S) /  ED DIAGNOSES  Final diagnoses:  None      This chart was dictated using voice recognition software.  Despite best efforts to proofread,  errors can occur which can change meaning.     Schuyler Amor, MD 06/11/15 [place], MD 06/11/15 [id number]

## 2015-06-11 NOTE — ED Notes (Signed)
Attempted to call report to floor. Was informed nurse unavailable and would return call

## 2015-06-11 NOTE — ED Notes (Signed)
Patient presents to the ED for right sided facial numbness/tingling and right arm numbness.  Patient states numbness seems somewhat better at this time but she does not feel completely normal.  Patient is alert and oriented x 4.  Speaking in complete sentences.  Ambulatory to triage without obvious distress.

## 2015-06-11 NOTE — H&P (Signed)
[place] [facility] [place] NAME: Theresa Lester    MR#:  [id number]  DATE OF BIRTH:  [birth year]  DATE OF ADMISSION:  06/11/2015  PRIMARY CARE PHYSICIAN: Halina Maidens, MD   REQUESTING/REFERRING PHYSICIAN: Dr. Burlene Arnt  CHIEF COMPLAINT:  Numbness of the right side of the face with drooling earlier today  HISTORY OF PRESENT ILLNESS:  Theresa Lester  is a 45 y.o. female with a known history of Acoustic neuroma, restless leg syndrome, GERD comes to the emergency room after she started having numbness on her right side of the face along with drooling of saliva from the right angle of the mouth while she was trying to look for her new home with her husband. Patient came to the emergency room received aspirin. Her symptoms have resolved. She denies dysarthria any focal weakness in upper or lower extremity or any visual loss. She is not having any difficulty swallowing. She is being admitted for further evaluation and management of TIA.  PAST MEDICAL HISTORY:   Past Medical History  Diagnosis Date  . Acoustic neuroma ([place])   . GERD (gastroesophageal reflux disease)   . Calculus of gallbladder   . Hemorrhoids, internal   . Restless leg     PAST SURGICAL HISTOIRY:   Past Surgical History  Procedure Laterality Date  . Bilateral carpal tunnel release    . Tubal ligation      SOCIAL HISTORY:   Social History  Substance Use Topics  . Smoking status: Never Smoker   . Smokeless tobacco: Never Used  . Alcohol Use: No    FAMILY HISTORY:   Family History  Problem Relation Age of Onset  . Hypertension Mother   . Diabetes Mother     DRUG ALLERGIES:  No Known Allergies  REVIEW OF SYSTEMS:  Review of Systems  Constitutional: Negative for fever, chills and weight loss.  HENT: Negative for ear discharge, ear pain and nosebleeds.   Eyes: Negative for blurred vision, pain and discharge.  Respiratory: Negative for sputum production,  shortness of breath, wheezing and stridor.   Cardiovascular: Negative for chest pain, palpitations, orthopnea and PND.  Gastrointestinal: Negative for nausea, vomiting, abdominal pain and diarrhea.  Genitourinary: Negative for urgency and frequency.  Musculoskeletal: Negative for back pain and joint pain.  Neurological: Positive for sensory change. Negative for speech change, focal weakness and weakness.  Psychiatric/Behavioral: Negative for depression and hallucinations. The patient is not nervous/anxious.   All other systems reviewed and are negative.    MEDICATIONS AT HOME:   Prior to Admission medications   Medication Sig Start Date End Date Taking? Authorizing Provider  butalbital-acetaminophen-caffeine (FIORICET, ESGIC) 50-325-40 MG tablet Take 1 tablet by mouth 2 (two) times daily as needed for migraine.   Yes Historical Provider, MD  cyclobenzaprine (FLEXERIL) 10 MG tablet Take 10 mg by mouth at bedtime.   Yes Historical Provider, MD      VITAL SIGNS:  Blood pressure 130/90, pulse 87, temperature 98.9 F (37.2 C), temperature source Oral, resp. rate 17, height 5\' 3"  (1.6 m), weight 84.823 kg (187 lb), SpO2 99 %.  PHYSICAL EXAMINATION:  GENERAL:  45 y.o.-year-old patient lying in the bed with no acute distress.  EYES: Pupils equal, round, reactive to light and accommodation. No scleral icterus. Extraocular muscles intact.  HEENT: Head atraumatic, normocephalic. Oropharynx and nasopharynx clear.  NECK:  Supple, no jugular venous distention. No thyroid enlargement, no tenderness.  LUNGS: Normal breath sounds bilaterally, no wheezing, rales,rhonchi  or crepitation. No use of accessory muscles of respiration.  CARDIOVASCULAR: S1, S2 normal. No murmurs, rubs, or gallops.  ABDOMEN: Soft, nontender, nondistended. Bowel sounds present. No organomegaly or mass.  EXTREMITIES: No pedal edema, cyanosis, or clubbing.  NEUROLOGIC: Cranial nerves II through XII are intact. Muscle strength 5/5  in all extremities. Sensation intact. Gait not checked.  PSYCHIATRIC: The patient is alert and oriented x 3.  SKIN: No obvious rash, lesion, or ulcer.   LABORATORY PANEL:   CBC  Recent Labs Lab 06/11/15 1748  WBC 7.4  HGB 13.8  HCT 40.9  PLT 270   ------------------------------------------------------------------------------------------------------------------  Chemistries   Recent Labs Lab 06/11/15 1748  NA 138  K 3.7  CL 107  CO2 25  GLUCOSE 104*  BUN 16  CREATININE 0.62  CALCIUM 8.6*  AST 21  ALT 23  ALKPHOS 73  BILITOT 0.2*   ------------------------------------------------------------------------------------------------------------------  Cardiac Enzymes  Recent Labs Lab 06/11/15 1748  TROPONINI <0.03   ------------------------------------------------------------------------------------------------------------------  RADIOLOGY:  Ct Head Wo Contrast  06/11/2015  CLINICAL DATA:  Right-sided numbness and facial droop started about 4:30 p.m. EXAM: CT HEAD WITHOUT CONTRAST TECHNIQUE: Contiguous axial images were obtained from the base of the skull through the vertex without intravenous contrast. COMPARISON:  MR brain 02/10/2015 FINDINGS: There is no evidence of mass effect, midline shift or extra-axial fluid collections. There is no evidence of a space-occupying lesion or intracranial hemorrhage. There is no evidence of a cortical-based area of acute infarction. The ventricles and sulci are appropriate for the patient's age. The basal cisterns are patent. Visualized portions of the orbits are unremarkable. The visualized portions of the paranasal sinuses and mastoid air cells are unremarkable. The osseous structures are unremarkable. IMPRESSION: No acute intracranial pathology. These results were called by telephone at the time of interpretation on 06/11/2015 at 5:40 pm to Dr. Larae Grooms , who verbally acknowledged these results. Electronically Signed   By: Kathreen Devoid   On: 06/11/2015 17:41    EKG:  Normal sinus rhythm  IMPRESSION AND PLAN:  Theresa Lester  is a 45 y.o. female with a known history of Acoustic neuroma, restless leg syndrome, GERD comes to the emergency room after she started having numbness on her right side of the face along with drooling of saliva from the right angle of the mouth while she was trying to look for her new home with her husband.  1. TIA -Patient presented with numbness on the right side of the face with drooling of saliva from the right angle of the mouth. Symptoms resolved -Admit to medical floor -Neuro checks -Aspirin 81 mg daily -Chek ultrasound carotid and echo. -Patient CT head was negative.. If patient's symptoms recur consider MRI of the brain  2. Restless leg syndrome  3. GERD continue PPI  4. Lovenox for DVT prophylaxis   All the records are reviewed and case discussed with ED provider. Management plans discussed with the patient, family and they are in agreement.  CODE STATUS: Full  TOTAL TIME TAKING CARE OF THIS PATIENT:50  minutes.    Jillaine Waren M.D on 06/11/2015 at 8:29 PM  Between 7am to 6pm - Pager - [phone]  After 6pm go to [email or web address] - password [email or web address]  Office  [phone]  CC: Primary care physician; Halina Maidens, MD

## 2015-06-12 ENCOUNTER — Observation Stay: Payer: Commercial Managed Care - PPO

## 2015-06-12 ENCOUNTER — Observation Stay (HOSPITAL_BASED_OUTPATIENT_CLINIC_OR_DEPARTMENT_OTHER)
Admit: 2015-06-12 | Discharge: 2015-06-12 | Disposition: A | Discharge status: Home / Self Care | Payer: Commercial Managed Care - PPO | Attending: Internal Medicine | Admitting: Internal Medicine

## 2015-06-12 DIAGNOSIS — G518 Other disorders of facial nerve: Secondary | ICD-10-CM | POA: Diagnosis not present

## 2015-06-12 DIAGNOSIS — G5139 Clonic hemifacial spasm, unspecified: Secondary | ICD-10-CM | POA: Insufficient documentation

## 2015-06-12 DIAGNOSIS — G459 Transient cerebral ischemic attack, unspecified: Secondary | ICD-10-CM

## 2015-06-12 LAB — ECHOCARDIOGRAM COMPLETE
Height: 63 in
Weight: 2998.4 oz

## 2015-06-12 MED ORDER — CARBAMAZEPINE ER 100 MG PO TB12: 100.0000 mg | ORAL_TABLET | Freq: Two times a day (BID) | ORAL | Status: DC

## 2015-06-12 MED ORDER — CARBAMAZEPINE ER 100 MG PO TB12
100.0000 mg | ORAL_TABLET | Freq: Two times a day (BID) | ORAL | Status: DC
  Filled 2015-06-12 (×2): qty 1

## 2015-06-12 NOTE — Evaluation (Signed)
Physical Therapy Evaluation Patient Details Name: Theresa Lester MRN: [id number] DOB: 12-28-1970 Today's Date: 06/12/2015   History of Present Illness  Pt here with R sided numbness that has resolved.  Clinical Impression  Pt does well with PT exam showing good strength, coordination and confidence with mobility and ambulation.  She reports that she has no residual R sided symptoms and is at/near her baseline.  Pt does not require further PT intervention and will be safe to return home.     Follow Up Recommendations No PT follow up    Equipment Recommendations       Recommendations for Other Services       Precautions / Restrictions Precautions Precautions: None Restrictions Weight Bearing Restrictions: No      Mobility  Bed Mobility Overal bed mobility: Independent                Transfers Overall transfer level: Independent Equipment used: None                Ambulation/Gait Ambulation/Gait assistance: Independent Ambulation Distance (Feet): 250 Feet Assistive device: None       General Gait Details: Pt walks with good confidence and speed.  She has no LOBs and no safety concerns.    Stairs Stairs: Yes Stairs assistance: Independent Stair Management: One rail Right Number of Stairs: 15 General stair comments: Pt negotiates up/down steps reciprocally w/o hesitation or safety issues.   Wheelchair Mobility    Modified Rankin (Stroke Patients Only)       Balance Overall balance assessment: Independent                                           Pertinent Vitals/Pain Pain Assessment: No/denies pain    Home Living Family/patient expects to be discharged to:: Private residence Living Arrangements: Spouse/significant other Available Help at Discharge: Family   Home Access: Stairs to enter Entrance Stairs-Rails: Right Entrance Stairs-Number of Steps: 12          Prior Function Level of Independence:  Independent         Comments: Pt working at a job that requires a lot of standing/walking.  Able to do all she needs w/o issue.     Hand Dominance        Extremity/Trunk Assessment   Upper Extremity Assessment: Overall WFL for tasks assessed           Lower Extremity Assessment: Overall WFL for tasks assessed         Communication   Communication: No difficulties  Cognition Arousal/Alertness: Awake/alert Behavior During Therapy: WFL for tasks assessed/performed Overall Cognitive Status: Within Functional Limits for tasks assessed                      General Comments      Exercises        Assessment/Plan    PT Assessment Patent does not need any further PT services  PT Diagnosis Generalized weakness   PT Problem List    PT Treatment Interventions     PT Goals (Current goals can be found in the Care Plan section) Acute Rehab PT Goals Patient Stated Goal: Go home today    Frequency     Barriers to discharge        Co-evaluation  End of Session Equipment Utilized During Treatment: Gait belt Activity Tolerance: Patient tolerated treatment well Patient left: in bed;with call bell/phone within reach      Functional Assessment Tool Used: clinical judgement Functional Limitation: Mobility: Walking and moving around Mobility: Walking and Moving Around Current Status [phone]): 0 percent impaired, limited or restricted Mobility: Walking and Moving Around Goal Status [phone]): 0 percent impaired, limited or restricted Mobility: Walking and Moving Around Discharge Status [phone]): 0 percent impaired, limited or restricted    Time: [id number] PT Time Calculation (min) (ACUTE ONLY): 11 min   Charges:   PT Evaluation $PT Eval Low Complexity: 1 Procedure     PT G Codes:   PT G-Codes **NOT FOR INPATIENT CLASS** Functional Assessment Tool Used: clinical judgement Functional Limitation: Mobility: Walking and moving  around Mobility: Walking and Moving Around Current Status [id number]): 0 percent impaired, limited or restricted Mobility: Walking and Moving Around Goal Status [phone]): 0 percent impaired, limited or restricted Mobility: Walking and Moving Around Discharge Status [id number]): 0 percent impaired, limited or restricted    Kreg Shropshire , DPT  06/12/2015, 10:10 AM

## 2015-06-12 NOTE — Progress Notes (Signed)
*  PRELIMINARY RESULTS* Echocardiogram 2D Echocardiogram has been performed.  Theresa Lester 06/12/2015, 10:31 AM

## 2015-06-12 NOTE — Consult Note (Signed)
CC: R side of face twitching   HPI: Theresa Lester is an 45 y.o. female with a known history of Acoustic neuroma, restless leg syndrome, GERD comes to the emergency room after she started having numbness on her right side of the face along with drooling of saliva from the right angle of the mouth. Pt described as spasm sensation. Lasting for 30 min and self resolved. Pt has similar episode in past.   Past Medical History  Diagnosis Date  . Acoustic neuroma ([place])   . GERD (gastroesophageal reflux disease)   . Calculus of gallbladder   . Hemorrhoids, internal   . Restless leg     Past Surgical History  Procedure Laterality Date  . Bilateral carpal tunnel release    . Tubal ligation      Family History  Problem Relation Age of Onset  . Hypertension Mother   . Diabetes Mother     Social History:  reports that she has never smoked. She has never used smokeless tobacco. She reports that she does not drink alcohol or use illicit drugs.  No Known Allergies  Medications: I have reviewed the patient's current medications.  ROS: History obtained from the patient  General ROS: negative for - chills, fatigue, fever, night sweats, weight gain or weight loss Psychological ROS: negative for - behavioral disorder, hallucinations, memory difficulties, mood swings or suicidal ideation Ophthalmic ROS: negative for - blurry vision, double vision, eye pain or loss of vision ENT ROS: negative for - epistaxis, nasal discharge, oral lesions, sore throat, tinnitus or vertigo Allergy and Immunology ROS: negative for - hives or itchy/watery eyes Hematological and Lymphatic ROS: negative for - bleeding problems, bruising or swollen lymph nodes Endocrine ROS: negative for - galactorrhea, hair pattern changes, polydipsia/polyuria or temperature intolerance Respiratory ROS: negative for - cough, hemoptysis, shortness of breath or wheezing Cardiovascular ROS: negative for - chest pain, dyspnea on  exertion, edema or irregular heartbeat Gastrointestinal ROS: negative for - abdominal pain, diarrhea, hematemesis, nausea/vomiting or stool incontinence Genito-Urinary ROS: negative for - dysuria, hematuria, incontinence or urinary frequency/urgency Musculoskeletal ROS: negative for - joint swelling or muscular weakness Neurological ROS: as noted in HPI Dermatological ROS: negative for rash and skin lesion changes  Physical Examination: Blood pressure 126/88, pulse 81, temperature 97.9 F (36.6 C), temperature source Oral, resp. rate 20, height 5\' 3"  (1.6 m), weight 85.004 kg (187 lb 6.4 oz), SpO2 99 %.   Neurological Examination Mental Status: Alert, oriented, thought content appropriate.  Speech fluent without evidence of aphasia.  Able to follow 3 step commands without difficulty. Cranial Nerves: II: Discs flat bilaterally; Visual fields grossly normal, pupils equal, round, reactive to light and accommodation III,IV, VI: ptosis not present, extra-ocular motions intact bilaterally V,VII: smile symmetric, facial light touch sensation normal bilaterally VIII: hearing normal bilaterally IX,X: gag reflex present XI: bilateral shoulder shrug XII: midline tongue extension Motor: Right : Upper extremity   5/5    Left:     Upper extremity   5/5  Lower extremity   5/5     Lower extremity   5/5 Tone and bulk:normal tone throughout; no atrophy noted Sensory: Pinprick and light touch intact throughout, bilaterally Deep Tendon Reflexes: 2+ and symmetric throughout Plantars: Right: downgoing   Left: downgoing Cerebellar: normal finger-to-nose, normal rapid alternating movements and normal heel-to-shin test Gait: normal gait and station      Laboratory Studies:   Basic Metabolic Panel:  Recent Labs Lab 06/11/15 1748  NA 138  K  3.7  CL 107  CO2 25  GLUCOSE 104*  BUN 16  CREATININE 0.62  CALCIUM 8.6*    Liver Function Tests:  Recent Labs Lab 06/11/15 1748  AST 21  ALT 23   ALKPHOS 73  BILITOT 0.2*  PROT 7.4  ALBUMIN 4.3   No results for input(s): LIPASE, AMYLASE in the last 168 hours. No results for input(s): AMMONIA in the last 168 hours.  CBC:  Recent Labs Lab 06/11/15 1748  WBC 7.4  NEUTROABS 4.8  HGB 13.8  HCT 40.9  MCV 87.4  PLT 270    Cardiac Enzymes:  Recent Labs Lab 06/11/15 1748  TROPONINI <0.03    BNP: Invalid input(s): POCBNP  CBG:  Recent Labs Lab 06/11/15 1728  GLUCAP 123*    Microbiology: No results found for this or any previous visit.  Coagulation Studies:  Recent Labs  06/11/15 1748  LABPROT 13.3  INR 0.99    Urinalysis: No results for input(s): COLORURINE, LABSPEC, PHURINE, GLUCOSEU, HGBUR, BILIRUBINUR, KETONESUR, PROTEINUR, UROBILINOGEN, NITRITE, LEUKOCYTESUR in the last 168 hours.  Invalid input(s): APPERANCEUR  Lipid Panel:  No results found for: CHOL, TRIG, HDL, CHOLHDL, VLDL, LDLCALC  HgbA1C: No results found for: HGBA1C  Urine Drug Screen:  No results found for: LABOPIA, COCAINSCRNUR, LABBENZ, AMPHETMU, THCU, LABBARB  Alcohol Level: No results for input(s): ETH in the last 168 hours.  Other results: EKG: normal EKG, normal sinus rhythm, unchanged from previous tracings.  Imaging: Ct Head Wo Contrast  06/11/2015  CLINICAL DATA:  Right-sided numbness and facial droop started about 4:30 p.m. EXAM: CT HEAD WITHOUT CONTRAST TECHNIQUE: Contiguous axial images were obtained from the base of the skull through the vertex without intravenous contrast. COMPARISON:  MR brain 02/10/2015 FINDINGS: There is no evidence of mass effect, midline shift or extra-axial fluid collections. There is no evidence of a space-occupying lesion or intracranial hemorrhage. There is no evidence of a cortical-based area of acute infarction. The ventricles and sulci are appropriate for the patient's age. The basal cisterns are patent. Visualized portions of the orbits are unremarkable. The visualized portions of the  paranasal sinuses and mastoid air cells are unremarkable. The osseous structures are unremarkable. IMPRESSION: No acute intracranial pathology. These results were called by telephone at the time of interpretation on 06/11/2015 at 5:40 pm to Dr. Larae Grooms , who verbally acknowledged these results. Electronically Signed   By: Kathreen Devoid   On: 06/11/2015 17:41   Mr Brain Wo Contrast  06/12/2015  CLINICAL DATA:  TIA. History of acoustic neuroma. Right facial numbness and drooling. EXAM: MRI HEAD WITHOUT CONTRAST TECHNIQUE: Multiplanar, multiecho pulse sequences of the brain and surrounding structures were obtained without intravenous contrast. COMPARISON:  CT head 06/11/2015.  MRI head 02/10/2015 FINDINGS: Ventricle size is normal.  Cerebral volume is normal. Negative for acute or chronic infarction. Negative for demyelinating disease. Cerebral white matter is normal. Basal ganglia and brainstem is normal. Negative for intracranial hemorrhage. Soft tissue mass within the right internal auditory canal compatible with vestibular schwannoma. This is best seen on the prior study and appears unchanged. Intravenous contrast was given on the prior study and the mass shows diffuse enhancement. Paranasal sinuses clear.  Normal orbit. Normal pituitary and skullbase. IMPRESSION: Right vestibular schwannoma similar to prior studies. Otherwise normal.  Negative for infarct. Electronically Signed   By: Franchot Gallo M.D.   On: 06/12/2015 11:56   US Carotid Bilateral  06/12/2015  CLINICAL DATA:  Stroke, syncope EXAM: BILATERAL CAROTID DUPLEX ULTRASOUND  TECHNIQUE: Pearline Cables scale imaging, color Doppler and duplex ultrasound was performed of bilateral carotid and vertebral arteries in the neck. COMPARISON:  None. TECHNIQUE: Quantification of carotid stenosis is based on velocity parameters that correlate the residual internal carotid diameter with NASCET-based stenosis levels, using the diameter of the distal internal carotid  lumen as the denominator for stenosis measurement. The following velocity measurements were obtained: PEAK SYSTOLIC/END DIASTOLIC RIGHT ICA:                     79/42cm/sec CCA:                     [id number] SYSTOLIC ICA/CCA RATIO:  0.9 DIASTOLIC ICA/CCA RATIO: 1.5 ECA:                     56cm/sec LEFT ICA:                     59/31cm/sec CCA:                     [id number] SYSTOLIC ICA/CCA RATIO:  0.6 DIASTOLIC ICA/CCA RATIO: 1.2 ECA:                     98cm/sec FINDINGS: RIGHT CAROTID ARTERY: No significant plaque or stenosis. Normal waveforms and color Doppler signal. RIGHT VERTEBRAL ARTERY:  Normal flow direction and waveform. LEFT CAROTID ARTERY: No significant plaque or stenosis. Normal waveforms and color Doppler signal. LEFT VERTEBRAL ARTERY: Normal flow direction and waveform. IMPRESSION: 1. No significant carotid bifurcation plaque or stenosis. 2.  Antegrade bilateral vertebral arterial flow. Electronically Signed   By: Lucrezia Europe M.D.   On: 06/12/2015 11:54     Assessment/Plan:  45 y.o. female with a known history of Acoustic neuroma, restless leg syndrome, GERD comes to the emergency room after she started having numbness on her right side of the face along with drooling of saliva from the right angle of the mouth. Pt described as spasm sensation. Lasting for 30 min and self resolved. Pt has similar episode in past.   This sounds like hemifacial spasm  Will start tegretol 100 BID  D/c planning.      06/12/2015, 1:52 PM

## 2015-06-12 NOTE — Plan of Care (Signed)
Problem: Education: Goal: Knowledge of secondary prevention will improve Outcome: Progressing Pt admitted to Room 104 on 06/11/15 verbalizing right sided facial numbness

## 2015-06-12 NOTE — Discharge Summary (Signed)
[facility] [place] NAME: Theresa Lester    MR#:  [id number]  [place]:  11/08/1970  DATE OF ADMISSION:  06/11/2015 ADMITTING PHYSICIAN: Fritzi Mandes, MD  DATE OF DISCHARGE: 06/12/2015  PRIMARY CARE PHYSICIAN: Halina Maidens, MD    ADMISSION DIAGNOSIS:  Numbness [R20.0]  DISCHARGE DIAGNOSIS:  Active Problems:   TIA (transient ischemic attack)   Hemifacial spasm   SECONDARY DIAGNOSIS:   Past Medical History  Diagnosis Date  . Acoustic neuroma ([place])   . GERD (gastroesophageal reflux disease)   . Calculus of gallbladder   . Hemorrhoids, internal   . Restless leg     HOSPITAL COURSE:   1. Right hemifacial spasms. Stroke workup was negative. I consulted Dr. Irish Elders neurology. He came by and discussed Tegretol treatment for these hemifacial spasms. We will start 100 mg twice a day and see how things progress. Follow-up as outpatient with PMD and can consider neurology consultation as outpatient. 2. Right vestibular schwannoma similar to prior studies seen on MRI of the brain. 3. Headache and muscle spasms. Can continue as needed muscle relaxant and Fioricet 4. History of gastroesophageal reflux disease  DISCHARGE CONDITIONS:   Satisfactory  CONSULTS OBTAINED:  Treatment Team:  Leotis Pain, MD  DRUG ALLERGIES:  No Known Allergies  DISCHARGE MEDICATIONS:   Current Discharge Medication List    START taking these medications   Details  carbamazepine (TEGRETOL XR) 100 MG 12 hr tablet Take 1 tablet (100 mg total) by mouth 2 (two) times daily. Qty: 60 tablet, Refills: 0      CONTINUE these medications which have NOT CHANGED   Details  butalbital-acetaminophen-caffeine (FIORICET, ESGIC) 50-325-40 MG tablet Take 1 tablet by mouth 2 (two) times daily as needed for migraine.    cyclobenzaprine (FLEXERIL) 10 MG tablet Take 10 mg by mouth at bedtime.      STOP taking these medications     fluticasone (FLONASE)  50 MCG/ACT nasal spray          DISCHARGE INSTRUCTIONS:   Follow-up with PMD one week. Recommend checking a follow-up sodium as outpatient  If you experience worsening of your admission symptoms, develop shortness of breath, life threatening emergency, suicidal or homicidal thoughts you must seek medical attention immediately by calling 911 or calling your MD immediately  if symptoms less severe.  You Must read complete instructions/literature along with all the possible adverse reactions/side effects for all the Medicines you take and that have been prescribed to you. Take any new Medicines after you have completely understood and accept all the possible adverse reactions/side effects.   Please note  You were cared for by a hospitalist during your hospital stay. If you have any questions about your discharge medications or the care you received while you were in the hospital after you are discharged, you can call the unit and asked to speak with the hospitalist on call if the hospitalist that took care of you is not available. Once you are discharged, your primary care physician will handle any further medical issues. Please note that NO REFILLS for any discharge medications will be authorized once you are discharged, as it is imperative that you return to your primary care physician (or establish a relationship with a primary care physician if you do not have one) for your aftercare needs so that they can reassess your need for medications and monitor your lab values.    Today   CHIEF COMPLAINT:   Chief  Complaint  Patient presents with  . Code Stroke    HISTORY OF PRESENT ILLNESS:  Theresa Lester  is a 45 y.o. female admitted with right-sided facial drawing   VITAL SIGNS:  Blood pressure 126/88, pulse 81, temperature 97.9 F (36.6 C), temperature source Oral, resp. rate 20, height 5\' 3"  (1.6 m), weight 85.004 kg (187 lb 6.4 oz), SpO2 99 %.    PHYSICAL EXAMINATION:   GENERAL:  45 y.o.-year-old patient lying in the bed with no acute distress.  EYES: Pupils equal, round, reactive to light and accommodation. No scleral icterus. Extraocular muscles intact.  HEENT: Head atraumatic, normocephalic. Oropharynx and nasopharynx clear.  NECK:  Supple, no jugular venous distention. No thyroid enlargement, no tenderness.  LUNGS: Normal breath sounds bilaterally, no wheezing, rales,rhonchi or crepitation. No use of accessory muscles of respiration.  CARDIOVASCULAR: S1, S2 normal. No murmurs, rubs, or gallops.  ABDOMEN: Soft, non-tender, non-distended. Bowel sounds present. No organomegaly or mass.  EXTREMITIES: No pedal edema, cyanosis, or clubbing.  NEUROLOGIC: Cranial nerves II through XII are intact. Muscle strength 5/5 in all extremities. Sensation intact. Gait not checked.  PSYCHIATRIC: The patient is alert and oriented x 3.  SKIN: No obvious rash, lesion, or ulcer.   DATA REVIEW:   CBC  Recent Labs Lab 06/11/15 1748  WBC 7.4  HGB 13.8  HCT 40.9  PLT 270    Chemistries   Recent Labs Lab 06/11/15 1748  NA 138  K 3.7  CL 107  CO2 25  GLUCOSE 104*  BUN 16  CREATININE 0.62  CALCIUM 8.6*  AST 21  ALT 23  ALKPHOS 73  BILITOT 0.2*    Cardiac Enzymes  Recent Labs Lab 06/11/15 1748  TROPONINI <0.03     RADIOLOGY:  Ct Head Wo Contrast  06/11/2015  CLINICAL DATA:  Right-sided numbness and facial droop started about 4:30 p.m. EXAM: CT HEAD WITHOUT CONTRAST TECHNIQUE: Contiguous axial images were obtained from the base of the skull through the vertex without intravenous contrast. COMPARISON:  MR brain 02/10/2015 FINDINGS: There is no evidence of mass effect, midline shift or extra-axial fluid collections. There is no evidence of a space-occupying lesion or intracranial hemorrhage. There is no evidence of a cortical-based area of acute infarction. The ventricles and sulci are appropriate for the patient's age. The basal cisterns are patent.  Visualized portions of the orbits are unremarkable. The visualized portions of the paranasal sinuses and mastoid air cells are unremarkable. The osseous structures are unremarkable. IMPRESSION: No acute intracranial pathology. These results were called by telephone at the time of interpretation on 06/11/2015 at 5:40 pm to Dr. Larae Grooms , who verbally acknowledged these results. Electronically Signed   By: Kathreen Devoid   On: 06/11/2015 17:41   Mr Brain Wo Contrast  06/12/2015  CLINICAL DATA:  TIA. History of acoustic neuroma. Right facial numbness and drooling. EXAM: MRI HEAD WITHOUT CONTRAST TECHNIQUE: Multiplanar, multiecho pulse sequences of the brain and surrounding structures were obtained without intravenous contrast. COMPARISON:  CT head 06/11/2015.  MRI head 02/10/2015 FINDINGS: Ventricle size is normal.  Cerebral volume is normal. Negative for acute or chronic infarction. Negative for demyelinating disease. Cerebral white matter is normal. Basal ganglia and brainstem is normal. Negative for intracranial hemorrhage. Soft tissue mass within the right internal auditory canal compatible with vestibular schwannoma. This is best seen on the prior study and appears unchanged. Intravenous contrast was given on the prior study and the mass shows diffuse enhancement. Paranasal sinuses clear.  Normal  orbit. Normal pituitary and skullbase. IMPRESSION: Right vestibular schwannoma similar to prior studies. Otherwise normal.  Negative for infarct. Electronically Signed   By: Franchot Gallo M.D.   On: 06/12/2015 11:56   US Carotid Bilateral  06/12/2015  CLINICAL DATA:  Stroke, syncope EXAM: BILATERAL CAROTID DUPLEX ULTRASOUND TECHNIQUE: Pearline Cables scale imaging, color Doppler and duplex ultrasound was performed of bilateral carotid and vertebral arteries in the neck. COMPARISON:  None. TECHNIQUE: Quantification of carotid stenosis is based on velocity parameters that correlate the residual internal carotid diameter  with NASCET-based stenosis levels, using the diameter of the distal internal carotid lumen as the denominator for stenosis measurement. The following velocity measurements were obtained: PEAK SYSTOLIC/END DIASTOLIC RIGHT ICA:                     79/42cm/sec CCA:                     [id number] SYSTOLIC ICA/CCA RATIO:  0.9 DIASTOLIC ICA/CCA RATIO: 1.5 ECA:                     56cm/sec LEFT ICA:                     59/31cm/sec CCA:                     [id number] SYSTOLIC ICA/CCA RATIO:  0.6 DIASTOLIC ICA/CCA RATIO: 1.2 ECA:                     98cm/sec FINDINGS: RIGHT CAROTID ARTERY: No significant plaque or stenosis. Normal waveforms and color Doppler signal. RIGHT VERTEBRAL ARTERY:  Normal flow direction and waveform. LEFT CAROTID ARTERY: No significant plaque or stenosis. Normal waveforms and color Doppler signal. LEFT VERTEBRAL ARTERY: Normal flow direction and waveform. IMPRESSION: 1. No significant carotid bifurcation plaque or stenosis. 2.  Antegrade bilateral vertebral arterial flow. Electronically Signed   By: Lucrezia Europe M.D.   On: 06/12/2015 11:54    Management plans discussed with the patient, and she is in agreement.  CODE STATUS:     Code Status Orders        Start     Ordered   06/11/15 2110  Full code   Continuous     06/11/15 2109    Code Status History    Date Active Date Inactive Code Status Order ID Comments User Context   This patient has a current code status but no historical code status.      TOTAL TIME TAKING CARE OF THIS PATIENT: 35 minutes.    Loletha Grayer M.D on 06/12/2015 at 2:12 PM  Between 7am to 6pm - Pager - [phone]  After 6pm go to [email or web address] - password [email or web address] [facility]  [phone]  CC: Primary care physician; Halina Maidens, MD

## 2015-06-12 NOTE — Discharge Instructions (Signed)
Hemifacial spasms  Check sodium blood test in follow up appointment

## 2015-06-12 NOTE — Progress Notes (Signed)
MD making rounds. Discharge orders received. IV discontinued. One prescription given to patient. Provided with Education Handouts. Discharge paperwork provided, explained, signed and witnessed. No unanswered questions. Discharged via wheelchair by Nursing Staff. Belongings sent with patient and family.

## 2015-06-15 ENCOUNTER — Ambulatory Visit
Admission: RE | Admit: 2015-06-15 | Discharge: 2015-06-15 | Disposition: A | Discharge status: Home / Self Care | Payer: Commercial Managed Care - PPO | Source: Ambulatory Visit | Attending: Radiation Oncology | Admitting: Radiation Oncology

## 2015-06-15 DIAGNOSIS — D333 Benign neoplasm of cranial nerves: Secondary | ICD-10-CM

## 2015-06-15 MED ORDER — GADOBENATE DIMEGLUMINE 529 MG/ML IV SOLN
17.0000 mL | Freq: Once | INTRAVENOUS | Status: AC | PRN
  Administered 2015-06-15: 17 mL via INTRAVENOUS

## 2015-06-17 ENCOUNTER — Encounter: Payer: Self-pay | Admitting: Radiation Therapy

## 2015-06-17 NOTE — Progress Notes (Signed)
Theresa Lester was seen on 5/31 by Dr. [name] to review her recent scan and discuss her new symptoms of facial weakness and spasm.   He decided to start dexamethasone 2 mg bid and seen her again in 2 weeks to see how she is doing and to determine when to do the next MRI. If she is improving, he plans to taper her down off the steroids after that visit.  He will be in touch after he sees her to inform us of next steps.   Mont Dutton R.T.(R)(T) Special Procedures Navigator Radiation Oncology [phone]

## 2015-07-18 ENCOUNTER — Other Ambulatory Visit: Payer: Self-pay | Admitting: Internal Medicine

## 2015-07-30 NOTE — Progress Notes (Addendum)
  Radiation Oncology         [phone]) [phone] ________________________________  Name: Theresa Lester MRN: [id number]  Date: 03/03/2015  DOB: Jan 23, 1970  Stereotactic Body Radiotherapy Treatment Procedure Note    ICD-9-CM ICD-10-CM   1. Unilateral vestibular schwannoma (HCC) 225.1 D33.3     CURRENT FRACTION:    2  PLANNED FRACTIONS:  5  NARRATIVE:  Theresa Lester was brought to the stereotactic radiation treatment machine and placed supine on the treatment couch. The patient was set up for stereotactic cranial radiotherapy in the brainlab mask.  SIMULATION VERIFICATION:  The patient underwent Exactrac and CT imaging on the treatment unit.  These were carefully aligned to document that the ablative radiation dose would cover the target volume and maximally spare the nearby organs at risk according to the planned distribution.  SPECIAL TREATMENT PROCEDURE: Theresa Lester received high dose ablative stereotactic cranial radiotherapy to the planned target volume (acoustic neuroma) without unforeseen complications. Treatment was delivered uneventfully. The high doses associated with stereotactic radiotherapy and the significant potential risks require careful treatment set up and patient monitoring constituting a special treatment procedure   STEREOTACTIC TREATMENT MANAGEMENT:  Following delivery, the patient was evaluated clinically. The patient tolerated treatment without significant acute effects, and was discharged to home in stable condition.    PLAN: Continue treatment as planned.  ________________________________  Sheral Apley. [name], M.D.

## 2015-08-09 ENCOUNTER — Other Ambulatory Visit: Payer: Self-pay | Admitting: Radiation Oncology

## 2015-08-09 ENCOUNTER — Other Ambulatory Visit: Payer: Self-pay | Admitting: Radiation Therapy

## 2015-08-09 DIAGNOSIS — D333 Benign neoplasm of cranial nerves: Secondary | ICD-10-CM

## 2015-08-09 MED ORDER — LORAZEPAM 0.5 MG PO TABS: ORAL_TABLET | ORAL | 0 refills | Status: DC

## 2015-08-11 ENCOUNTER — Telehealth: Payer: Self-pay | Admitting: Radiation Oncology

## 2015-08-11 NOTE — Telephone Encounter (Signed)
Received in basket reference need for medication refill prior to MRI. Noted Shona Simpson, PA-C refilled medication but, it wasn't received/confirmed by the pharmacy. Phoned in script to pharmacy. Then, phoned patient and left message this was done.

## 2015-09-14 ENCOUNTER — Ambulatory Visit
Admission: RE | Admit: 2015-09-14 | Discharge: 2015-09-14 | Disposition: A | Discharge status: Home / Self Care | Payer: Commercial Managed Care - PPO | Source: Ambulatory Visit | Attending: Radiation Oncology | Admitting: Radiation Oncology

## 2015-09-14 MED ORDER — GADOBENATE DIMEGLUMINE 529 MG/ML IV SOLN
19.0000 mL | Freq: Once | INTRAVENOUS | Status: AC | PRN
  Administered 2015-09-14: 19 mL via INTRAVENOUS

## 2015-09-17 ENCOUNTER — Ambulatory Visit: Payer: Commercial Managed Care - PPO | Admitting: Internal Medicine

## 2015-09-21 ENCOUNTER — Ambulatory Visit: Payer: Commercial Managed Care - PPO | Admitting: Internal Medicine

## 2015-10-06 ENCOUNTER — Ambulatory Visit (INDEPENDENT_AMBULATORY_CARE_PROVIDER_SITE_OTHER): Payer: Commercial Managed Care - PPO | Admitting: Internal Medicine

## 2015-10-06 ENCOUNTER — Encounter: Payer: Self-pay | Admitting: Internal Medicine

## 2015-10-06 VITALS — BP 120/80 | HR 82 | Temp 98.4°F | Resp 16 | Ht 63.0 in | Wt 187.0 lb

## 2015-10-06 DIAGNOSIS — M25559 Pain in unspecified hip: Secondary | ICD-10-CM

## 2015-10-06 DIAGNOSIS — L732 Hidradenitis suppurativa: Secondary | ICD-10-CM

## 2015-10-06 DIAGNOSIS — Z1239 Encounter for other screening for malignant neoplasm of breast: Secondary | ICD-10-CM

## 2015-10-06 MED ORDER — AMOXICILLIN-POT CLAVULANATE 875-125 MG PO TABS: 1.0000 | ORAL_TABLET | Freq: Two times a day (BID) | ORAL | 0 refills | Status: DC

## 2015-10-06 MED ORDER — CYCLOBENZAPRINE HCL 10 MG PO TABS: 10.0000 mg | ORAL_TABLET | Freq: Every day | ORAL | 2 refills | Status: DC

## 2015-10-06 NOTE — Progress Notes (Signed)
Date:  10/06/2015   Name:  Theresa Lester   DOB:  03-Nov-1970   MRN:  [id number]   Chief Complaint: Recurrent Skin Infections (Under left arm pit. ) and Muscle Pain (Stopped taking Flexeril daily and went to PRN but now having joint pain and muscle pain. )  She uses flexeril at night.  Would like to take it during the day sometimes.  Her job is physical - unloading heavy products.  She does not take any nsaids.  She sometimes has tension headaches for which she also take fioricet.  Review of Systems  Constitutional: Negative for chills, fatigue and fever.  Respiratory: Negative for cough, chest tightness and shortness of breath.   Cardiovascular: Negative for chest pain, palpitations and leg swelling.  Gastrointestinal: Negative for abdominal pain.  Musculoskeletal: Positive for arthralgias and myalgias. Negative for gait problem and joint swelling.  Skin: Positive for wound.  Neurological: Positive for headaches. Negative for seizures, syncope and numbness.  Psychiatric/Behavioral: Negative for sleep disturbance. The patient is not nervous/anxious.     Patient Active Problem List   Diagnosis Date Noted  . Hemifacial spasm   . TIA (transient ischemic attack) 06/11/2015  . Episodic tension-type headache, not intractable 04/14/2015  . Unilateral vestibular schwannoma ([place]) 01/12/2015  . Nail avulsion of toe 01/12/2015  . Speech abnormality 11/25/2014  . Tinnitus 11/25/2014  . Mixed migraine and muscle contraction headache 10/20/2014  . Angioneurotic edema 10/19/2014  . Arthralgia of hip or thigh 10/19/2014  . Chapped lips 10/19/2014  . Calculus of gallbladder 10/19/2014  . Acid reflux 10/19/2014  . Blood glucose elevated 10/19/2014  . Hemorrhoids, internal 10/19/2014  . Restless leg 10/19/2014  . Fast heart beat 10/19/2014    Prior to Admission medications   Medication Sig Start Date End Date Taking? Authorizing Provider  butalbital-acetaminophen-caffeine  (FIORICET, ESGIC) 50-325-40 MG tablet TAKE 1 TABLET TWICE A DAY AS NEEDED FOR HEADACHE OR MIGRAINE 07/21/15  Yes [name], MD  carbamazepine (TEGRETOL XR) 100 MG 12 hr tablet Take 1 tablet (100 mg total) by mouth 2 (two) times daily. 06/12/15  Yes Richard Leslye Peer, MD  cyclobenzaprine (FLEXERIL) 10 MG tablet Take 10 mg by mouth at bedtime.   Yes Historical Provider, MD    No Known Allergies  Past Surgical History:  Procedure Laterality Date  . BILATERAL CARPAL TUNNEL RELEASE    . TUBAL LIGATION      Social History  Substance Use Topics  . Smoking status: Never Smoker  . Smokeless tobacco: Never Used  . Alcohol use No     Medication list has been reviewed and updated.   Physical Exam  Constitutional: She is oriented to person, place, and time. She appears well-developed. No distress.  HENT:  Head: Normocephalic and atraumatic.  Cardiovascular: Normal rate and regular rhythm.   Pulmonary/Chest: Effort normal and breath sounds normal. No respiratory distress.  Musculoskeletal: Normal range of motion.  Neurological: She is alert and oriented to person, place, and time.  Skin: Skin is warm and dry. No rash noted.  Firm induration with tenderness in left axilla - about 1.5 cm size.  No drainage or fluctuance seen.  Psychiatric: She has a normal mood and affect. Her behavior is normal. Thought content normal.  Nursing note and vitals reviewed.   BP 120/80   Pulse 82   Temp 98.4 F (36.9 C)   Resp 16   Ht 5\' 3"  (1.6 m)   Wt 187 lb (84.8 kg)  LMP 09/10/2015   SpO2 97%   BMI 33.13 kg/m   Assessment and Plan: 1. Axillary hidradenitis suppurativa Warm compresses Gen Surg referral if persistent - amoxicillin-clavulanate (AUGMENTIN) 875-125 MG tablet; Take 1 tablet by mouth 2 (two) times daily.  Dispense: 20 tablet; Refill: 0  2. Arthralgia of hip or thigh, unspecified laterality Continue topical rubs, flexeril as needed  3. Breast cancer screening - MM DIGITAL  SCREENING BILATERAL; Future   Halina Maidens, MD [facility]  10/06/2015

## 2015-10-18 ENCOUNTER — Other Ambulatory Visit: Payer: Self-pay | Admitting: Internal Medicine

## 2015-10-20 ENCOUNTER — Ambulatory Visit
Admission: RE | Admit: 2015-10-20 | Discharge: 2015-10-20 | Disposition: A | Discharge status: Home / Self Care | Payer: Commercial Managed Care - PPO | Source: Ambulatory Visit | Attending: Internal Medicine | Admitting: Internal Medicine

## 2015-10-20 DIAGNOSIS — Z1239 Encounter for other screening for malignant neoplasm of breast: Secondary | ICD-10-CM

## 2015-10-20 DIAGNOSIS — Z1231 Encounter for screening mammogram for malignant neoplasm of breast: Secondary | ICD-10-CM | POA: Insufficient documentation

## 2015-11-05 ENCOUNTER — Encounter: Payer: Self-pay | Admitting: Internal Medicine

## 2015-11-05 ENCOUNTER — Ambulatory Visit (INDEPENDENT_AMBULATORY_CARE_PROVIDER_SITE_OTHER): Payer: Commercial Managed Care - PPO | Admitting: Internal Medicine

## 2015-11-05 ENCOUNTER — Encounter (INDEPENDENT_AMBULATORY_CARE_PROVIDER_SITE_OTHER): Payer: Self-pay

## 2015-11-05 VITALS — BP 108/78 | HR 100 | Temp 98.3°F | Resp 16 | Ht 63.0 in | Wt 193.0 lb

## 2015-11-05 DIAGNOSIS — L732 Hidradenitis suppurativa: Secondary | ICD-10-CM

## 2015-11-05 HISTORY — DX: Hidradenitis suppurativa: L73.2

## 2015-11-05 MED ORDER — SULFAMETHOXAZOLE-TRIMETHOPRIM 800-160 MG PO TABS: 1.0000 | ORAL_TABLET | Freq: Two times a day (BID) | ORAL | 0 refills | Status: DC

## 2015-11-05 NOTE — Progress Notes (Signed)
    Date:  11/05/2015   Name:  Theresa Lester   DOB:  22-Nov-1970   MRN:  [id number]   Chief Complaint: Recurrent Skin Infections (Under Left Arm- Took Abx 2 weeks ago and cleared up but now is back. Not shaving and has not since this started before last visit. Red and hot and it opens up but never drains. Complains it has fowel odor. )    Review of Systems  Constitutional: Negative for fatigue and fever.  Skin: Positive for wound (with odor).    Patient Active Problem List   Diagnosis Date Noted  . Hemifacial spasm   . TIA (transient ischemic attack) 06/11/2015  . Episodic tension-type headache, not intractable 04/14/2015  . Unilateral vestibular schwannoma ([place]) 01/12/2015  . Nail avulsion of toe 01/12/2015  . Speech abnormality 11/25/2014  . Tinnitus 11/25/2014  . Mixed migraine and muscle contraction headache 10/20/2014  . Angioneurotic edema 10/19/2014  . Arthralgia of hip or thigh 10/19/2014  . Chapped lips 10/19/2014  . Calculus of gallbladder 10/19/2014  . Acid reflux 10/19/2014  . Blood glucose elevated 10/19/2014  . Hemorrhoids, internal 10/19/2014  . Restless leg 10/19/2014  . Fast heart beat 10/19/2014    Prior to Admission medications   Medication Sig Start Date End Date Taking? Authorizing Provider  butalbital-acetaminophen-caffeine (FIORICET, ESGIC) 50-325-40 MG tablet TAKE 1 TABLET TWICE A DAY AS NEEDED FOR HEADACHE OR MIGRAINE 07/21/15  Yes [name], MD  carbamazepine (TEGRETOL XR) 100 MG 12 hr tablet Take 1 tablet (100 mg total) by mouth 2 (two) times daily. 06/12/15  Yes Loletha Grayer, MD  cyclobenzaprine (FLEXERIL) 10 MG tablet Take 1 tablet (10 mg total) by mouth at bedtime. 10/06/15  Yes [name], MD    No Known Allergies  Past Surgical History:  Procedure Laterality Date  . BILATERAL CARPAL TUNNEL RELEASE    . TUBAL LIGATION      Social History  Substance Use Topics  . Smoking status: Never Smoker  . Smokeless tobacco:  Never Used  . Alcohol use No     Medication list has been reviewed and updated.   Physical Exam  Constitutional: She is oriented to person, place, and time. She appears well-developed. No distress.  HENT:  Head: Normocephalic and atraumatic.  Cardiovascular: Normal rate and regular rhythm.   Pulmonary/Chest: Effort normal and breath sounds normal. No respiratory distress.  Musculoskeletal: Normal range of motion.  Neurological: She is alert and oriented to person, place, and time.  Skin: Skin is warm and dry. No rash noted.  Firm induration with tenderness in left axilla - about .5 cm size.  No drainage or fluctuance seen.  Improved from last visit  Psychiatric: She has a normal mood and affect. Her behavior is normal. Thought content normal.  Nursing note and vitals reviewed.   BP 108/78   Pulse 100   Temp 98.3 F (36.8 C) (Oral)   Resp 16   Ht 5\' 3"  (1.6 m)   Wt 193 lb (87.5 kg)   LMP 10/13/2015   SpO2 100%   BMI 34.19 kg/m   Assessment and Plan: 1. Left axillary hidradenitis Will change antibiotics Use powder or bandage to reduce friction while working - sulfamethoxazole-trimethoprim (BACTRIM DS,SEPTRA DS) 800-160 MG tablet; Take 1 tablet by mouth 2 (two) times daily.  Dispense: 20 tablet; Refill: 0   Halina Maidens, MD [facility]  11/05/2015

## 2015-11-19 ENCOUNTER — Other Ambulatory Visit: Payer: Self-pay | Admitting: *Deleted

## 2015-11-19 DIAGNOSIS — D333 Benign neoplasm of cranial nerves: Secondary | ICD-10-CM

## 2015-11-24 ENCOUNTER — Other Ambulatory Visit: Payer: Self-pay | Admitting: *Deleted

## 2015-12-02 ENCOUNTER — Other Ambulatory Visit: Payer: Commercial Managed Care - PPO

## 2015-12-03 ENCOUNTER — Other Ambulatory Visit: Payer: Self-pay | Admitting: Internal Medicine

## 2015-12-03 MED ORDER — CYCLOBENZAPRINE HCL 10 MG PO TABS: 10.0000 mg | ORAL_TABLET | Freq: Every day | ORAL | 2 refills | Status: DC

## 2015-12-06 ENCOUNTER — Other Ambulatory Visit: Payer: Self-pay | Admitting: Internal Medicine

## 2015-12-06 ENCOUNTER — Telehealth: Payer: Self-pay

## 2015-12-06 MED ORDER — CYCLOBENZAPRINE HCL 10 MG PO TABS: 10.0000 mg | ORAL_TABLET | Freq: Three times a day (TID) | ORAL | 2 refills | Status: DC | PRN

## 2015-12-06 NOTE — Telephone Encounter (Signed)
Patient called and left a message stating she needs refill on Flexeril. Original RX was written for 1 tablet daily but she takes TID tablets daily now and she has ran out of this due to different directions. Takes this for muscle/joint pain and she states she discussed this with you on her last visit-aa

## 2015-12-23 ENCOUNTER — Other Ambulatory Visit: Payer: Commercial Managed Care - PPO

## 2015-12-24 ENCOUNTER — Ambulatory Visit
Admission: RE | Admit: 2015-12-24 | Discharge: 2015-12-24 | Disposition: A | Discharge status: Home / Self Care | Payer: Commercial Managed Care - PPO | Source: Ambulatory Visit | Attending: Radiation Oncology | Admitting: Radiation Oncology

## 2015-12-24 DIAGNOSIS — D333 Benign neoplasm of cranial nerves: Secondary | ICD-10-CM

## 2015-12-24 MED ORDER — GADOBENATE DIMEGLUMINE 529 MG/ML IV SOLN
18.0000 mL | Freq: Once | INTRAVENOUS | Status: AC | PRN
  Administered 2015-12-24: 18 mL via INTRAVENOUS

## 2016-01-24 DIAGNOSIS — G513 Clonic hemifacial spasm: Secondary | ICD-10-CM | POA: Diagnosis not present

## 2016-01-24 DIAGNOSIS — D333 Benign neoplasm of cranial nerves: Secondary | ICD-10-CM | POA: Diagnosis not present

## 2016-01-24 DIAGNOSIS — R030 Elevated blood-pressure reading, without diagnosis of hypertension: Secondary | ICD-10-CM | POA: Diagnosis not present

## 2016-02-06 ENCOUNTER — Emergency Department: Payer: Commercial Managed Care - PPO

## 2016-02-06 ENCOUNTER — Emergency Department
Admission: EM | Admit: 2016-02-06 | Discharge: 2016-02-06 | Disposition: A | Discharge status: Home / Self Care | Payer: Commercial Managed Care - PPO | Attending: Emergency Medicine | Admitting: Emergency Medicine

## 2016-02-06 ENCOUNTER — Encounter: Payer: Self-pay | Admitting: Emergency Medicine

## 2016-02-06 DIAGNOSIS — Y92512 Supermarket, store or market as the place of occurrence of the external cause: Secondary | ICD-10-CM | POA: Insufficient documentation

## 2016-02-06 DIAGNOSIS — X500XXA Overexertion from strenuous movement or load, initial encounter: Secondary | ICD-10-CM | POA: Diagnosis not present

## 2016-02-06 DIAGNOSIS — S4991XA Unspecified injury of right shoulder and upper arm, initial encounter: Secondary | ICD-10-CM | POA: Diagnosis present

## 2016-02-06 DIAGNOSIS — Y9389 Activity, other specified: Secondary | ICD-10-CM | POA: Diagnosis not present

## 2016-02-06 DIAGNOSIS — Y990 Civilian activity done for income or pay: Secondary | ICD-10-CM | POA: Diagnosis not present

## 2016-02-06 DIAGNOSIS — Z79899 Other long term (current) drug therapy: Secondary | ICD-10-CM | POA: Insufficient documentation

## 2016-02-06 DIAGNOSIS — M25511 Pain in right shoulder: Secondary | ICD-10-CM | POA: Insufficient documentation

## 2016-02-06 MED ORDER — NAPROXEN 500 MG PO TBEC: 500.0000 mg | DELAYED_RELEASE_TABLET | Freq: Two times a day (BID) | ORAL | 0 refills | Status: AC

## 2016-02-06 MED ORDER — KETOROLAC TROMETHAMINE 30 MG/ML IJ SOLN
30.0000 mg | Freq: Once | INTRAMUSCULAR | Status: DC
  Filled 2016-02-06: qty 1

## 2016-02-06 MED ORDER — KETOROLAC TROMETHAMINE 60 MG/2ML IM SOLN
30.0000 mg | Freq: Once | INTRAMUSCULAR | Status: AC
Start: 2016-02-06 — End: 2016-02-06
  Administered 2016-02-06: 30 mg via INTRAMUSCULAR

## 2016-02-06 NOTE — ED Provider Notes (Signed)
[facility] Emergency Department Provider Note  ____________________________________________  Time seen: Approximately 3:49 PM  I have reviewed the triage vital signs and the nursing notes.   HISTORY  Chief Complaint Shoulder Pain    HPI Theresa Lester is a 46 y.o. female with a history of unilateral vestibular schwannoma and TIA presents to the emergency department with right shoulder pain. She is currently receiving radiation for unilateral vestibular schwannoma. Patient states that she has experienced 10 out of 10 aching right shoulder pain for the past week. Patient states that right shoulder pain radiates down her right arm into her fingers. She denies chest pain, shortness of breath, diaphoresis, nausea or vomiting. Patient has not experienced a similar episode of right shoulder pain in the past. 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 Patient denies numbness, tingling, weakness or coldness of the right upper extremity. Patient is right-handed. She denies incidences of trauma. Patient has tried ibuprofen and muscle relaxers which relieves her pain temporarily.   Past Medical History:  Diagnosis Date  . Acoustic neuroma ([place])   . Calculus of gallbladder   . GERD (gastroesophageal reflux disease)   . Hemorrhoids, internal   . Restless leg     Patient Active Problem List   Diagnosis Date Noted  . Left axillary hidradenitis 11/05/2015  . Hemifacial spasm   . TIA (transient ischemic attack) 06/11/2015  . Episodic tension-type headache, not intractable 04/14/2015  . Unilateral vestibular schwannoma ([place]) 01/12/2015  . Nail avulsion of toe 01/12/2015  . Speech abnormality 11/25/2014  . Tinnitus 11/25/2014  . Mixed migraine and muscle contraction headache 10/20/2014  . Angioneurotic edema 10/19/2014  . Arthralgia of hip or thigh 10/19/2014  . Chapped lips 10/19/2014  . Calculus of gallbladder  10/19/2014  . Acid reflux 10/19/2014  . Blood glucose elevated 10/19/2014  . Hemorrhoids, internal 10/19/2014  . Restless leg 10/19/2014  . Fast heart beat 10/19/2014    Past Surgical History:  Procedure Laterality Date  . BILATERAL CARPAL TUNNEL RELEASE    . TUBAL LIGATION      Prior to Admission medications   Medication Sig Start Date End Date Taking? Authorizing Provider  butalbital-acetaminophen-caffeine (FIORICET, ESGIC) 50-325-40 MG tablet TAKE 1 TABLET TWICE A DAY AS NEEDED FOR HEADACHE OR MIGRAINE 07/21/15   [name], MD  carbamazepine (TEGRETOL XR) 100 MG 12 hr tablet Take 1 tablet (100 mg total) by mouth 2 (two) times daily. 06/12/15   Loletha Grayer, MD  cyclobenzaprine (FLEXERIL) 10 MG tablet Take 1 tablet (10 mg total) by mouth 3 (three) times daily as needed for muscle spasms. 12/06/15   [name], MD  naproxen (EC NAPROSYN) 500 MG EC tablet Take 1 tablet (500 mg total) by mouth 2 (two) times daily with a meal. 02/06/16 02/13/16  Lannie Fields, PA-C  sulfamethoxazole-trimethoprim (BACTRIM DS,SEPTRA DS) 800-160 MG tablet Take 1 tablet by mouth 2 (two) times daily. 11/05/15   [name], MD    Allergies Patient has no known allergies.  Family History  Problem Relation Age of Onset  . Hypertension Mother   . Diabetes Mother   . Breast cancer Neg Hx     Social History Social History  Substance Use Topics  . Smoking status: Never Smoker  . Smokeless tobacco: Never Used  . Alcohol use No     Review of Systems  Constitutional: No fever/chills Cardiovascular: no chest pain. Respiratory: no cough. No SOB.  Gastrointestinal: No abdominal pain.  No nausea, no vomiting.  No diarrhea.  No constipation. Musculoskeletal: Patient has right shoulder and neck pain.  Skin: Negative for rash, abrasions, lacerations, ecchymosis. Neurological: Negative for headaches, focal weakness or numbness. 10-point ROS otherwise  negative.  ____________________________________________   PHYSICAL EXAM:  VITAL SIGNS: ED Triage Vitals  Enc Vitals Group     BP 02/06/16 1420 (!) 133/96     Pulse Rate 02/06/16 1420 100     Resp 02/06/16 1420 16     Temp 02/06/16 1420 98 F (36.7 C)     Temp Source 02/06/16 1420 Oral     SpO2 02/06/16 1420 100 %     Weight 02/06/16 1420 192 lb (87.1 kg)     Height 02/06/16 1420 5\' 2"  (1.575 m)     Head Circumference --      [place] --      Pain Score 02/06/16 1422 10     Pain Loc --      Pain Edu? --      Excl. in [place]? --      Constitutional: Alert and oriented. Well appearing and in no acute distress. Cardiovascular: Normal rate, regular rhythm. Normal S1 and S2.  Good peripheral circulation. Respiratory: Normal respiratory effort without tachypnea or retractions. Lungs CTAB. Good air entry to the bases with no decreased or absent breath sounds. Musculoskeletal: To inspection, right upper extremities appear symmetrical. Patient has 5 out of 5 strength in the upper extremities bilaterally. She has full range of motion at the right shoulder, right elbow and right wrist. No tenderness or weakness was elicited with rotator cuff testing, right. Patient's right shoulder symptoms were reproduced with range of motion testing at the neck. Patient is able to demonstrate full range of motion at the neck. Patient has tenderness with palpation of the right upper trapezius. She has strong palpable radial and ulnar pulses bilaterally and symmetrically. Neurologic:  Normal speech and language. No gross focal neurologic deficits are appreciated.  Reflexes are 2+ and symmetrical in the upper extremities bilaterally. Skin:  Skin is warm, dry and intact. No rash noted. Psychiatric: Mood and affect are normal. Speech and behavior are normal. Patient exhibits appropriate insight and judgement.   ____________________________________________   LABS (all labs ordered are listed, but only abnormal  results are displayed)  Labs Reviewed - No data to display ____________________________________________  EKG   ____________________________________________  RADIOLOGY Unk Pinto, personally viewed and evaluated these images (plain radiographs) as part of my medical decision making, as well as reviewing the written report by the radiologist.  Dg Cervical Spine Complete  Result Date: 02/06/2016 CLINICAL DATA:  Initial evaluation for root neck pain with right shoulder pain. No injury. EXAM: CERVICAL SPINE - COMPLETE 4+ VIEW COMPARISON:  None available. FINDINGS: Straightening with slight reversal of the normal cervical lordosis. No listhesis or subluxation. Vertebral body heights are maintained. No acute fracture. Normal C1-2 articulations are preserved in the dens is intact. Moderate degenerative intervertebral disc space narrowing present at C6-7, with more mild degenerative narrowing at C4-5 and C5-6. Visualized lung apices are clear. IMPRESSION: 1. No radiographic evidence for acute abnormality within the cervical spine. 2. Mild to moderate degenerative spondylolysis at C4-5 through C6-7 (greatest at C6-7). Electronically Signed   By: Jeannine Boga M.D.   On: 02/06/2016 16:40   Dg Shoulder Right  Result Date: 02/06/2016 CLINICAL DATA:  Right shoulder pain.  Denies injury. EXAM: RIGHT SHOULDER - 2+ VIEW COMPARISON:  None. FINDINGS: There is no evidence of fracture or dislocation. Moderate degenerative changes are identified involving the acromioclavicular joint. IMPRESSION: 1. No acute findings. 2. AC joint osteoarthritis. Electronically Signed   By: Kerby Moors M.D.   On: 02/06/2016 15:25   DG Cervical Spine: Spondylolysis at C4-C5 through C6-7 ____________________________________________    PROCEDURES  Procedure(s) performed:    Procedures    Medications  ketorolac (TORADOL) injection 30 mg (30 mg Intramuscular Given 02/06/16 1708)      ____________________________________________   INITIAL IMPRESSION / ASSESSMENT AND PLAN / ED COURSE  Pertinent labs & imaging results that were available during my care of the patient were reviewed by me and considered in my medical decision making (see chart for details).  Review of the [place] CSRS was performed in accordance of the [place] prior to dispensing any controlled drugs.     Assessment and plan: Right Shoulder Pain Referred from the Neck Patient presents to the emergency department with right shoulder pain. On physical exam, right shoulder pain is reproduced with range of motion testing at the neck. DG cervical spine indicates spondylolysis at C4-C5 through C6-7. DG right shoulder reveals no acute bony abnormalities. Patient has full range of motion at the right shoulder and no weakness with rotator cuff testing. Patient was discharged with naproxen. I did not start patient on a trial of prednisone as she is receiving radiation therapy currently. She was advised to follow up with orthopedics in one week if right shoulder pain persists. Patient denies nausea, vomiting, chest pain, diaphoresis and SOB. Vital signs are reassuring at this time. All patient questions were answered.  ____________________________________________  FINAL CLINICAL IMPRESSION(S) / ED DIAGNOSES  Final diagnoses:  Acute pain of right shoulder      NEW MEDICATIONS STARTED DURING THIS VISIT:  New Prescriptions   NAPROXEN (EC NAPROSYN) 500 MG EC TABLET    Take 1 tablet (500 mg total) by mouth 2 (two) times daily with a meal.        This chart was dictated using voice recognition software/Dragon. Despite best efforts to proofread, errors can occur which can change the meaning. Any change was purely unintentional.    Lannie Fields, PA-C 02/06/16 [place], MD 02/08/16 [phone]

## 2016-02-06 NOTE — ED Notes (Signed)

## 2016-02-06 NOTE — ED Triage Notes (Signed)
States pain in rt shoulder - does not remember any injury to it

## 2016-02-17 ENCOUNTER — Encounter: Payer: Commercial Managed Care - PPO | Admitting: Internal Medicine

## 2016-04-01 ENCOUNTER — Other Ambulatory Visit: Payer: Self-pay | Admitting: Internal Medicine

## 2016-07-07 ENCOUNTER — Encounter: Payer: Commercial Managed Care - PPO | Admitting: Internal Medicine

## 2016-09-11 ENCOUNTER — Other Ambulatory Visit: Payer: Self-pay | Admitting: Radiation Therapy

## 2016-09-11 DIAGNOSIS — D333 Benign neoplasm of cranial nerves: Secondary | ICD-10-CM

## 2016-09-20 ENCOUNTER — Ambulatory Visit
Admission: RE | Admit: 2016-09-20 | Discharge: 2016-09-20 | Disposition: A | Discharge status: Home / Self Care | Payer: Commercial Managed Care - PPO | Source: Ambulatory Visit | Attending: Radiation Oncology | Admitting: Radiation Oncology

## 2016-09-20 DIAGNOSIS — D333 Benign neoplasm of cranial nerves: Secondary | ICD-10-CM | POA: Diagnosis not present

## 2016-09-20 MED ORDER — GADOBENATE DIMEGLUMINE 529 MG/ML IV SOLN: 18.0000 mL | Freq: Once | INTRAVENOUS | Status: DC | PRN

## 2016-09-26 DIAGNOSIS — D333 Benign neoplasm of cranial nerves: Secondary | ICD-10-CM | POA: Diagnosis not present

## 2016-09-26 DIAGNOSIS — G513 Clonic hemifacial spasm: Secondary | ICD-10-CM | POA: Diagnosis not present

## 2016-10-11 ENCOUNTER — Ambulatory Visit
Admission: RE | Admit: 2016-10-11 | Discharge: 2016-10-11 | Disposition: A | Discharge status: Home / Self Care | Payer: Commercial Managed Care - PPO | Source: Ambulatory Visit | Attending: Internal Medicine | Admitting: Internal Medicine

## 2016-10-11 ENCOUNTER — Encounter: Payer: Self-pay | Admitting: Internal Medicine

## 2016-10-11 ENCOUNTER — Other Ambulatory Visit: Payer: Self-pay | Admitting: Internal Medicine

## 2016-10-11 ENCOUNTER — Ambulatory Visit (INDEPENDENT_AMBULATORY_CARE_PROVIDER_SITE_OTHER): Payer: Commercial Managed Care - PPO | Admitting: Internal Medicine

## 2016-10-11 VITALS — BP 110/72 | HR 73 | Ht 63.0 in | Wt 183.4 lb

## 2016-10-11 DIAGNOSIS — M25562 Pain in left knee: Secondary | ICD-10-CM

## 2016-10-11 DIAGNOSIS — G513 Clonic hemifacial spasm: Secondary | ICD-10-CM

## 2016-10-11 DIAGNOSIS — D333 Benign neoplasm of cranial nerves: Secondary | ICD-10-CM

## 2016-10-11 DIAGNOSIS — M1712 Unilateral primary osteoarthritis, left knee: Secondary | ICD-10-CM

## 2016-10-11 DIAGNOSIS — M722 Plantar fascial fibromatosis: Secondary | ICD-10-CM

## 2016-10-11 DIAGNOSIS — Z23 Encounter for immunization: Secondary | ICD-10-CM

## 2016-10-11 DIAGNOSIS — G5139 Clonic hemifacial spasm, unspecified: Secondary | ICD-10-CM

## 2016-10-11 DIAGNOSIS — M25462 Effusion, left knee: Secondary | ICD-10-CM | POA: Insufficient documentation

## 2016-10-11 DIAGNOSIS — M170 Bilateral primary osteoarthritis of knee: Secondary | ICD-10-CM | POA: Insufficient documentation

## 2016-10-11 NOTE — Progress Notes (Signed)
Date:  10/11/2016   Name:  Theresa Lester   DOB:  1970-12-23   MRN:  [id number]   Chief Complaint: Edema (Swelling on Both feet X 3 weeks. Do a lot of walking at work. using icepacks not helping. ); Numbness (When laying leg out straight L leg becomes numb. When walking sometimes L leg buckle's up on her/ ); and Immunizations (FLU SHOT)  Knee Pain   There was no injury mechanism. The pain is present in the left knee. The patient is experiencing no pain. The pain has been fluctuating since onset. Associated symptoms comments: Catching at times and feels like she will fall. The symptoms are aggravated by weight bearing. She has tried nothing for the symptoms.   Foot swelling - over the past several weeks she's had pain and swelling on the bottom of her right foot. Seems to progress as the day goes on. Most of the pain is on the plantar aspect at the insertion to the calcaneus of the tendon. She denies trauma. Her left foot is normal. She's tried ice but no ibuprofen with minimal improvement. She does work on her feet all day.  Hemifacial spasm - she continues on Tegretol with good results. She has not been seen recently and it [place] unclear what follow up is needed.  Vestibular schwannoma  - doing well s/p radiosurgery with no current complaints.  Review of Systems  Constitutional: Negative for chills, fatigue and fever.  HENT: Negative for facial swelling, tinnitus, trouble swallowing and voice change.   Eyes: Negative for visual disturbance.  Respiratory: Negative for shortness of breath.   Cardiovascular: Negative for chest pain, palpitations and leg swelling.  Gastrointestinal: Negative for abdominal pain.  Musculoskeletal: Positive for arthralgias and gait problem (from right foot pain).  Skin: Negative for color change and rash.  Neurological: Negative for dizziness, tremors, weakness and headaches.    Patient Active Problem List   Diagnosis Date Noted  . Left axillary  hidradenitis 11/05/2015  . Hemifacial spasm   . TIA (transient ischemic attack) 06/11/2015  . Episodic tension-type headache, not intractable 04/14/2015  . Unilateral vestibular schwannoma ([place]) 01/12/2015  . Tinnitus 11/25/2014  . Mixed migraine and muscle contraction headache 10/20/2014  . Angioneurotic edema 10/19/2014  . Arthralgia of hip or thigh 10/19/2014  . Calculus of gallbladder 10/19/2014  . Acid reflux 10/19/2014  . Blood glucose elevated 10/19/2014  . Hemorrhoids, internal 10/19/2014  . Restless leg 10/19/2014  . Fast heart beat 10/19/2014    Prior to Admission medications   Medication Sig Start Date End Date Taking? Authorizing Provider  butalbital-acetaminophen-caffeine (FIORICET, ESGIC) 50-325-40 MG tablet TAKE 1 TABLET TWICE A DAY AS NEEDED FOR HEADACHE OR MIGRAINE 07/21/15  Yes [name], MD  carbamazepine (TEGRETOL XR) 100 MG 12 hr tablet Take 1 tablet (100 mg total) by mouth 2 (two) times daily. 06/12/15  Yes Wieting, Richard, MD  cyclobenzaprine (FLEXERIL) 10 MG tablet TAKE 1 TABLET (10 MG TOTAL) BY MOUTH 3 (THREE) TIMES DAILY AS NEEDED FOR MUSCLE SPASMS. 04/01/16  Yes [name], MD    No Known Allergies  Past Surgical History:  Procedure Laterality Date  . BILATERAL CARPAL TUNNEL RELEASE    . TUBAL LIGATION      Social History  Substance Use Topics  . Smoking status: Never Smoker  . Smokeless tobacco: Never Used  . Alcohol use No     Medication list has been reviewed and updated.  PHQ 2/9 Scores 10/11/2016 04/15/2015  02/17/2015 02/17/2015  PHQ - 2 Score 0 0 0 0    Physical Exam  Constitutional: She is oriented to person, place, and time. She appears well-developed. No distress.  HENT:  Head: Normocephalic and atraumatic.  Cardiovascular: Normal rate, regular rhythm and normal heart sounds.   Pulmonary/Chest: Effort normal and breath sounds normal. No respiratory distress. She has no wheezes.  Musculoskeletal:       Right hip: Normal.        Left hip: She exhibits decreased range of motion (decreased internal rotation). She exhibits no tenderness.       Left knee: She exhibits decreased range of motion. She exhibits no swelling and no effusion. No tenderness found.       Feet:  No knee crepitus, no calf cord or swelling   Neurological: She is alert and oriented to person, place, and time.  Skin: Skin is warm and dry. No rash noted.  Psychiatric: She has a normal mood and affect. Her behavior is normal. Thought content normal.  Nursing note and vitals reviewed.   BP 110/72 (BP Location: Right Arm, Patient Position: Sitting, Cuff Size: Normal)   Pulse 73   Ht 5\' 3"  (1.6 m)   Wt 183 lb 6.4 oz (83.2 kg)   LMP 08/23/2016   SpO2 100%   BMI 32.49 kg/m   Assessment and Plan: 1. Left knee pain, unspecified chronicity Possible cartilage irregularity causing intermittent sx - DG Knee Complete 4 Views Left; Future  2. Plantar fasciitis of right foot Begin Advil tid and ice massage  3. Need for influenza vaccination - Flu Vaccine QUAD 36+ mos IM  4. Unilateral vestibular schwannoma ([place]) Doing well  5. Hemifacial spasm Continue Tegretol   No orders of the defined types were placed in this encounter.   Partially dictated using Editor, commissioning. Any errors are unintentional.  Halina Maidens, MD [facility]  10/11/2016

## 2016-10-11 NOTE — Progress Notes (Signed)
Patient agrees to orthopedics-

## 2016-10-11 NOTE — Patient Instructions (Addendum)
We will call about xray of knee.  If xray is normal, I recommend a soft knee sleeve for support while working.  Plantar Fasciitis Plantar fasciitis is a painful foot condition that affects the heel. It occurs when the band of tissue that connects the toes to the heel bone (plantar fascia) becomes irritated. This can happen after exercising too much or doing other repetitive activities (overuse injury). The pain from plantar fasciitis can range from mild irritation to severe pain that makes it difficult for you to walk or move. The pain is usually worse in the morning or after you have been sitting or lying down for a while. What are the causes? This condition may be caused by:  Standing for long periods of time.  Wearing shoes that do not fit.  Doing high-impact activities, including running, aerobics, and ballet.  Being overweight.  Having an abnormal way of walking (gait).  Having tight calf muscles.  Having high arches in your feet.  Starting a new athletic activity.  What are the signs or symptoms? The main symptom of this condition is heel pain. Other symptoms include:  Pain that gets worse after activity or exercise.  Pain that is worse in the morning or after resting.  Pain that goes away after you walk for a few minutes.  How is this diagnosed? This condition may be diagnosed based on your signs and symptoms. Your health care provider will also do a physical exam to check for:  A tender area on the bottom of your foot.  A high arch in your foot.  Pain when you move your foot.  Difficulty moving your foot.  You may also need to have imaging studies to confirm the diagnosis. These can include:  X-rays.  Ultrasound.  MRI.  How is this treated? Treatment for plantar fasciitis depends on the severity of the condition. Your treatment may include:  Rest, ice, and over-the-counter pain medicines to manage your pain.  Exercises to stretch your calves and your  plantar fascia.  A splint that holds your foot in a stretched, upward position while you sleep (night splint).  Physical therapy to relieve symptoms and prevent problems in the future.  Cortisone injections to relieve severe pain.  Extracorporeal shock wave therapy (ESWT) to stimulate damaged plantar fascia with electrical impulses. It is often used as a last resort before surgery.  Surgery, if other treatments have not worked after 12 months.  Follow these instructions at home:  Take medicines only as directed by your health care provider.  Avoid activities that cause pain.  Roll the bottom of your foot over a bag of ice or a bottle of cold water. Do this for 20 minutes, 3-4 times a day.  Perform simple stretches as directed by your health care provider.  Try wearing athletic shoes with air-sole or gel-sole cushions or soft shoe inserts.  Wear a night splint while sleeping, if directed by your health care provider.  Keep all follow-up appointments with your health care provider. How is this prevented?  Do not perform exercises or activities that cause heel pain.  Consider finding low-impact activities if you continue to have problems.  Lose weight if you need to. The best way to prevent plantar fasciitis is to avoid the activities that aggravate your plantar fascia. Contact a health care provider if:  Your symptoms do not go away after treatment with home care measures.  Your pain gets worse.  Your pain affects your ability to move or  do your daily activities. This information is not intended to replace advice given to you by your health care provider. Make sure you discuss any questions you have with your health care provider. Document Released: 09/27/2000 Document Revised: 06/07/2015 Document Reviewed: 11/12/2013 Elsevier Interactive Patient Education  [employer].

## 2016-10-17 DIAGNOSIS — M25562 Pain in left knee: Secondary | ICD-10-CM | POA: Diagnosis not present

## 2016-10-17 DIAGNOSIS — M1712 Unilateral primary osteoarthritis, left knee: Secondary | ICD-10-CM | POA: Diagnosis not present

## 2016-11-02 DIAGNOSIS — M1712 Unilateral primary osteoarthritis, left knee: Secondary | ICD-10-CM | POA: Diagnosis not present

## 2016-11-22 DIAGNOSIS — M1712 Unilateral primary osteoarthritis, left knee: Secondary | ICD-10-CM | POA: Diagnosis not present

## 2016-11-23 ENCOUNTER — Other Ambulatory Visit: Payer: Self-pay | Admitting: Sports Medicine

## 2016-11-23 DIAGNOSIS — M1712 Unilateral primary osteoarthritis, left knee: Secondary | ICD-10-CM

## 2016-12-05 ENCOUNTER — Ambulatory Visit
Admission: RE | Admit: 2016-12-05 | Discharge: 2016-12-05 | Disposition: A | Discharge status: Home / Self Care | Payer: Commercial Managed Care - PPO | Source: Ambulatory Visit | Attending: Sports Medicine | Admitting: Sports Medicine

## 2016-12-05 DIAGNOSIS — X58XXXA Exposure to other specified factors, initial encounter: Secondary | ICD-10-CM | POA: Insufficient documentation

## 2016-12-05 DIAGNOSIS — S83242A Other tear of medial meniscus, current injury, left knee, initial encounter: Secondary | ICD-10-CM | POA: Insufficient documentation

## 2016-12-05 DIAGNOSIS — M25462 Effusion, left knee: Secondary | ICD-10-CM | POA: Insufficient documentation

## 2016-12-05 DIAGNOSIS — M1712 Unilateral primary osteoarthritis, left knee: Secondary | ICD-10-CM | POA: Insufficient documentation

## 2016-12-12 DIAGNOSIS — S83232A Complex tear of medial meniscus, current injury, left knee, initial encounter: Secondary | ICD-10-CM | POA: Diagnosis not present

## 2017-01-10 ENCOUNTER — Other Ambulatory Visit: Payer: Self-pay

## 2017-01-10 ENCOUNTER — Encounter: Payer: Self-pay | Admitting: *Deleted

## 2017-01-18 ENCOUNTER — Ambulatory Visit: Payer: Commercial Managed Care - PPO | Admitting: Anesthesiology

## 2017-01-18 ENCOUNTER — Encounter
Admission: RE | Disposition: A | Discharge status: Home / Self Care | Payer: Self-pay | Source: Ambulatory Visit | Attending: Orthopedic Surgery

## 2017-01-18 ENCOUNTER — Ambulatory Visit
Admission: RE | Admit: 2017-01-18 | Discharge: 2017-01-18 | Disposition: A | Discharge status: Home / Self Care | Payer: Commercial Managed Care - PPO | Source: Ambulatory Visit | Attending: Orthopedic Surgery | Admitting: Orthopedic Surgery

## 2017-01-18 DIAGNOSIS — D333 Benign neoplasm of cranial nerves: Secondary | ICD-10-CM | POA: Insufficient documentation

## 2017-01-18 DIAGNOSIS — Z79899 Other long term (current) drug therapy: Secondary | ICD-10-CM | POA: Diagnosis not present

## 2017-01-18 DIAGNOSIS — S83232A Complex tear of medial meniscus, current injury, left knee, initial encounter: Secondary | ICD-10-CM | POA: Diagnosis not present

## 2017-01-18 DIAGNOSIS — W010XXA Fall on same level from slipping, tripping and stumbling without subsequent striking against object, initial encounter: Secondary | ICD-10-CM | POA: Diagnosis not present

## 2017-01-18 DIAGNOSIS — K219 Gastro-esophageal reflux disease without esophagitis: Secondary | ICD-10-CM | POA: Insufficient documentation

## 2017-01-18 DIAGNOSIS — M1712 Unilateral primary osteoarthritis, left knee: Secondary | ICD-10-CM | POA: Insufficient documentation

## 2017-01-18 HISTORY — PX: KNEE ARTHROSCOPY WITH MEDIAL MENISECTOMY: SHX5651

## 2017-01-18 SURGERY — ARTHROSCOPY, KNEE, WITH MEDIAL MENISCECTOMY
Anesthesia: Regional | Laterality: Left

## 2017-01-18 MED ORDER — MIDAZOLAM HCL 5 MG/5ML IJ SOLN
INTRAMUSCULAR | Status: DC | PRN
  Administered 2017-01-18: 2 mg via INTRAVENOUS

## 2017-01-18 MED ORDER — LIDOCAINE-EPINEPHRINE (PF) 1 %-1:200000 IJ SOLN
INTRAMUSCULAR | Status: DC | PRN
  Administered 2017-01-18 (×2): 5 mL

## 2017-01-18 MED ORDER — ROPIVACAINE HCL 5 MG/ML IJ SOLN
INTRAMUSCULAR | Status: DC | PRN
  Administered 2017-01-18: 20 mL via PERINEURAL

## 2017-01-18 MED ORDER — ONDANSETRON 4 MG PO TBDP: 4.0000 mg | ORAL_TABLET | Freq: Three times a day (TID) | ORAL | 0 refills | Status: DC | PRN

## 2017-01-18 MED ORDER — FENTANYL CITRATE (PF) 100 MCG/2ML IJ SOLN
INTRAMUSCULAR | Status: DC | PRN
  Administered 2017-01-18 (×2): 25 ug via INTRAVENOUS
  Administered 2017-01-18: 100 ug via INTRAVENOUS
  Administered 2017-01-18 (×2): 25 ug via INTRAVENOUS

## 2017-01-18 MED ORDER — ONDANSETRON HCL 4 MG/2ML IJ SOLN
INTRAMUSCULAR | Status: DC | PRN
  Administered 2017-01-18: 4 mg via INTRAVENOUS

## 2017-01-18 MED ORDER — LACTATED RINGERS IV SOLN
INTRAVENOUS | Status: DC
  Administered 2017-01-18 (×2): via INTRAVENOUS

## 2017-01-18 MED ORDER — DEXTROSE 5 % IV SOLN
2000.0000 mg | Freq: Once | INTRAVENOUS | Status: AC
  Administered 2017-01-18: 2000 mg via INTRAVENOUS

## 2017-01-18 MED ORDER — HYDROMORPHONE HCL 1 MG/ML IJ SOLN
0.2500 mg | INTRAMUSCULAR | Status: DC | PRN
  Administered 2017-01-18 (×4): 0.25 mg via INTRAVENOUS

## 2017-01-18 MED ORDER — PROPOFOL 10 MG/ML IV BOLUS
INTRAVENOUS | Status: DC | PRN
  Administered 2017-01-18: 50 mg via INTRAVENOUS
  Administered 2017-01-18: 150 mg via INTRAVENOUS

## 2017-01-18 MED ORDER — GLYCOPYRROLATE 0.2 MG/ML IJ SOLN
INTRAMUSCULAR | Status: DC | PRN
  Administered 2017-01-18: 0.1 mg via INTRAVENOUS

## 2017-01-18 MED ORDER — ACETAMINOPHEN 10 MG/ML IV SOLN
INTRAVENOUS | Status: DC | PRN
  Administered 2017-01-18: 1000 mg via INTRAVENOUS

## 2017-01-18 MED ORDER — ACETAMINOPHEN 500 MG PO TABS: 1000.0000 mg | ORAL_TABLET | Freq: Three times a day (TID) | ORAL | 2 refills | Status: DC

## 2017-01-18 MED ORDER — IBUPROFEN 800 MG PO TABS: 800.0000 mg | ORAL_TABLET | Freq: Three times a day (TID) | ORAL | 0 refills | Status: AC

## 2017-01-18 MED ORDER — FENTANYL CITRATE (PF) 100 MCG/2ML IJ SOLN
25.0000 ug | INTRAMUSCULAR | Status: DC | PRN
  Administered 2017-01-18: 25 ug via INTRAVENOUS

## 2017-01-18 MED ORDER — LABETALOL HCL 5 MG/ML IV SOLN
INTRAVENOUS | Status: DC | PRN
  Administered 2017-01-18: 5 mg via INTRAVENOUS

## 2017-01-18 MED ORDER — ASPIRIN EC 325 MG PO TBEC: 325.0000 mg | DELAYED_RELEASE_TABLET | Freq: Every day | ORAL | 0 refills | Status: AC

## 2017-01-18 MED ORDER — LIDOCAINE HCL (CARDIAC) 20 MG/ML IV SOLN
INTRAVENOUS | Status: DC | PRN
  Administered 2017-01-18: 30 mg via INTRATRACHEAL

## 2017-01-18 MED ORDER — BUPIVACAINE HCL 0.5 % IJ SOLN
INTRAMUSCULAR | Status: DC | PRN
  Administered 2017-01-18 (×2): 5 mL

## 2017-01-18 MED ORDER — OXYCODONE HCL 5 MG PO TABS: 5.0000 mg | ORAL_TABLET | ORAL | 0 refills | Status: DC | PRN

## 2017-01-18 MED ORDER — OXYCODONE HCL 5 MG PO TABS
5.0000 mg | ORAL_TABLET | Freq: Once | ORAL | Status: AC | PRN
  Administered 2017-01-18: 5 mg via ORAL

## 2017-01-18 MED ORDER — DEXAMETHASONE SODIUM PHOSPHATE 4 MG/ML IJ SOLN
INTRAMUSCULAR | Status: DC | PRN
  Administered 2017-01-18: 4 mg via INTRAVENOUS

## 2017-01-18 MED ORDER — KETOROLAC TROMETHAMINE 30 MG/ML IJ SOLN
30.0000 mg | Freq: Once | INTRAMUSCULAR | Status: AC | PRN
  Administered 2017-01-18: 30 mg via INTRAVENOUS

## 2017-01-18 MED ORDER — OXYCODONE HCL 5 MG/5ML PO SOLN: 5.0000 mg | Freq: Once | ORAL | Status: AC | PRN

## 2017-01-18 SURGICAL SUPPLY — 53 items
ADAPTER IRRIG TUBE 2 SPIKE SOL (ADAPTER) ×8 IMPLANT
BLADE SURG 15 STRL LF DISP TIS (BLADE) ×1 IMPLANT
BLADE SURG 15 STRL SS (BLADE) ×1
BLADE SURG SZ11 CARB STEEL (BLADE) ×2 IMPLANT
BNDG COHESIVE 4X5 TAN STRL (GAUZE/BANDAGES/DRESSINGS) ×2 IMPLANT
BNDG ESMARK 6X12 TAN STRL LF (GAUZE/BANDAGES/DRESSINGS) ×2 IMPLANT
BRACE KNEE POST OP SHORT (BRACE) ×2 IMPLANT
BRUSH SCRUB EZ  4% CHG (MISCELLANEOUS) ×1
BRUSH SCRUB EZ 4% CHG (MISCELLANEOUS) ×1 IMPLANT
BUR RADIUS 3.5 (BURR) ×2 IMPLANT
BUR RADIUS 4.0X18.5 (BURR) ×2 IMPLANT
CARTRIDGE SUT 2-0 NONSTITCH (Anchor) ×2 IMPLANT
CHLORAPREP W/TINT 26ML (MISCELLANEOUS) ×2 IMPLANT
CLEANER CAUTERY TIP 5X5 PAD (MISCELLANEOUS) ×1 IMPLANT
COOLER POLAR GLACIER W/PUMP (MISCELLANEOUS) ×2 IMPLANT
CUFF TOURN SGL QUICK 24 (TOURNIQUET CUFF) ×1
CUFF TRNQT CYL 24X4X40X1 (TOURNIQUET CUFF) ×1 IMPLANT
DRAPE IMP U-DRAPE 54X76 (DRAPES) ×2 IMPLANT
GAUZE SPONGE 4X4 12PLY STRL (GAUZE/BANDAGES/DRESSINGS) ×2 IMPLANT
GLOVE BIO SURGEON STRL SZ7.5 (GLOVE) ×2 IMPLANT
GLOVE BIOGEL PI IND STRL 8 (GLOVE) ×2 IMPLANT
GLOVE BIOGEL PI INDICATOR 8 (GLOVE) ×2
GOWN STRL REUS W/ TWL LRG LVL3 (GOWN DISPOSABLE) ×1 IMPLANT
GOWN STRL REUS W/TWL LRG LVL3 (GOWN DISPOSABLE) ×3 IMPLANT
IMPL SYS MENISCAL ROOT REPAIR (Orthopedic Implant) ×2 IMPLANT
IV LACTATED RINGER IRRG 3000ML (IV SOLUTION) ×12
IV LR IRRIG 3000ML ARTHROMATIC (IV SOLUTION) ×12 IMPLANT
KIT ACL DISPOSABLE (KITS) ×2 IMPLANT
KIT RM TURNOVER STRD PROC AR (KITS) ×2 IMPLANT
MAT BLUE FLOOR 46X72 FLO (MISCELLANEOUS) ×2 IMPLANT
NEPTUNE MANIFOLD (MISCELLANEOUS) ×2 IMPLANT
NOVOSTICH PRO MENISCAL 2-0 (Miscellaneous) ×2 IMPLANT
PACK ARTHROSCOPY KNEE (MISCELLANEOUS) ×2 IMPLANT
PAD ABD DERMACEA PRESS 5X9 (GAUZE/BANDAGES/DRESSINGS) ×4 IMPLANT
PAD CLEANER CAUTERY TIP 5X5 (MISCELLANEOUS) ×1
PAD WRAPON POLAR KNEE (MISCELLANEOUS) ×1 IMPLANT
PENCIL ELECTRO HAND CTR (MISCELLANEOUS) ×2 IMPLANT
SET TUBE SUCT SHAVER OUTFL 24K (TUBING) ×2 IMPLANT
STRIP CLOSURE SKIN 1/2X4 (GAUZE/BANDAGES/DRESSINGS) ×2 IMPLANT
SUT ETHILON 4-0 (SUTURE) ×1
SUT ETHILON 4-0 FS2 18XMFL BLK (SUTURE) ×1
SUT MNCRL 4-0 (SUTURE) ×1
SUT MNCRL 4-0 27XMFL (SUTURE) ×1
SUT VIC AB 2-0 SH 27 (SUTURE) ×1
SUT VIC AB 2-0 SH 27XBRD (SUTURE) ×1 IMPLANT
SUT VICRYL 0 AB UR-6 (SUTURE) ×2 IMPLANT
SUTURE ETHLN 4-0 FS2 18XMF BLK (SUTURE) ×1 IMPLANT
SUTURE MNCRL 4-0 27XMF (SUTURE) ×1 IMPLANT
SYSTEM IMPL ACL/PCL SWIVILLOCK (Anchor) ×2 IMPLANT
SYSTEM NVSTCH PRO MENISCAL 2-0 (Miscellaneous) ×1 IMPLANT
TOWEL OR 17X26 4PK STRL BLUE (TOWEL DISPOSABLE) ×4 IMPLANT
TUBING ARTHRO INFLOW-ONLY STRL (TUBING) ×2 IMPLANT
WRAPON POLAR PAD KNEE (MISCELLANEOUS) ×2

## 2017-01-18 NOTE — Anesthesia Preprocedure Evaluation (Signed)
Anesthesia Evaluation  Patient identified by MRN, date of birth, ID band  Reviewed: NPO status   History of Anesthesia Complications Negative for: history of anesthetic complications  Airway Mallampati: II  TM Distance: >3 FB Neck ROM: full    Dental no notable dental hx.    Pulmonary neg pulmonary ROS,    Pulmonary exam normal        Cardiovascular negative cardio ROS Normal cardiovascular exam     Neuro/Psych  Headaches, L ear acoustic neuroma > on tegretol  negative psych ROS   GI/Hepatic Neg liver ROS, GERD  Controlled,  Endo/Other  negative endocrine ROS  Renal/GU negative Renal ROS  negative genitourinary   Musculoskeletal   Abdominal   Peds  Hematology negative hematology ROS (+)   Anesthesia Other Findings   Reproductive/Obstetrics negative OB ROS                             Anesthesia Physical Anesthesia Plan  ASA: II  Anesthesia Plan: General and Regional   Post-op Pain Management:  Regional for Post-op pain   Induction:   PONV Risk Score and Plan:   Airway Management Planned:   Additional Equipment:   Intra-op Plan:   Post-operative Plan:   Informed Consent: I have reviewed the patients History and Physical, chart, labs and discussed the procedure including the risks, benefits and alternatives for the proposed anesthesia with the patient or authorized representative who has indicated his/her understanding and acceptance.     Plan Discussed with: CRNA  Anesthesia Plan Comments: (Adductor Canal block.)        Anesthesia Quick Evaluation

## 2017-01-18 NOTE — Progress Notes (Signed)
Assisted Debabtrata Maji ANMD with left, ultrasound guided, adductor canal block. Side rails up, monitors on throughout procedure. See vital signs in flow sheet. Tolerated Procedure well.  

## 2017-01-18 NOTE — Op Note (Signed)
DATE: 01/18/2017  PRE-OP DIAGNOSIS:  1. Left medial meniscus root tear 2. Left Medial compartment and patellofemoral compartment osteoarthritis  POST-OP DIAGNOSIS:  1. Left medial meniscus root tear 2. Left Medial compartment and patellofemoral compartment osteoarthritis  PROCEDURES:  1. Left knee medial meniscus root repair  2. Left medial femoral condyle and patellofemoral chondroplasty    SURGEON:  Langston Reusing, MD  ASSISTANT(S):  Reche Dixon, [place]  ANESTHESIA: regional adductor canal block + general  TOTAL IV FLUIDS: See anesthesia record  ESTIMATED BLOOD LOSS: 10cc  TOURNIQUET TIME:  104 min  DRAINS:  None.  SPECIMENS: None  IMPLANTS:  - Arthrex Biocomposite SwiveLock (x1)   COMPLICATIONS: None  INDICATIONS: Theresa Lester is a 47 y.o. female with L knee pain that has failed non-operative management. Clinical exam and radiographic studies were notable for left knee pain and swelling with instances of giving way with associated popping and catching on the medial aspect of her knee. Additionally, MRI showed a medial meniscus root tear with mild degenerative changes to medial and patellofemoral compartments. Given the poor long-term prognosis of a meniscus root tear and high likelihood of significant progression of osteoarthritis, we elected to proceed with the above procedure after a discussion of the risks, benefits, and alternatives to surgery.   OPERATIVE FINDINGS:   Examination under anesthesia:A careful examination under anesthesia was performed. Passive range of motion was: Hyperextension: 2. Extension: 0. Flexion: 135. Lachman: normal. Pivot Shift: normal. Posterior drawer: normal. Varus stability in full extension: normal. Varus stability in 30 degrees of flexion: normal. Valgus stability in full extension: normal. Valgus stability in 30 degrees of flexion: normal.  Intra-operative findings:A thorough arthroscopic  examination of the knee was performed. The findings are: 1. Suprapatellar pouch: Normal 2. Undersurface of median ridge: Normal 3. Medial patellar facet: Grade 1 degenerative changes 4. Lateral patellar facet: Grade 1-2 degenerative changes 5. Trochlea: Grade 3-4 degenerative changes over central aspect of trochlear groove 6. Lateral gutter/popliteus tendon: Normal 7. Hoffa's fat pad: Inflamed 8. Medial gutter/plica: Normal 9. ACL: Normal 10. PCL: Normal 11. Medial meniscus: Complete tear of the medial meniscus at the posterior root 12. Medial compartment cartilage: areas of Grade 2-3 degenerative changes on MFC, grade 1-2 changes on tibial plateau 13. Lateral meniscus: Normal 14. Lateral compartment cartilage: Grade 1 changes to tibial plateau  DESCRIPTION OF PROCEDURE: I identified Coralie Keens Wilsonin the pre-operative holding area. I marked theoperativeknee with my initials. I reviewed the risks and benefits of the proposed surgical intervention and the patient (and/or patient's guardian) wished to proceed. The patient was transferred to the operative suite and placed in the supine position with all bony prominences padded. Anesthesia was administered. Appropriate IV antibioticswere administered prior to incision. The extremity was then prepped and draped in standard fashion. A time out was performed confirming the correct extremity, correct patient, and correct procedure.  Arthroscopy portals were marked. Local anesthetic was injected to the planned portal sites. The anterolateral portalwasestablished with an 11blade. The arthroscope was placed in the anterolateral portal and theninto the suprapatellar pouch. A diagnostic knee scope was completed with the above findings. The medial meniscus root tear was identified. Next the medial portal was established under needle localization. The MCL was pie-crusted to improve visualization of the posterior horn. Degenerative articular  cartilage from the medial compartment and patellofemoral compartments was debrided with a shaver until stable edges remained. The meniscus root tear was identified and probed to confirm our findings. Next, an Arthrex meniscus root aiming  guide was used to mark out the tibial incision. An approximately 5cm vertical incision was made medial to the tibial tubercle. This was carried down to the sartorius fascia with bovie electrocautery, and the fascia was incised. An elevator was used to clear periosteum from the tibia in the area of the anticipated bone tunnel. Hemostasis was achieved. The guide was reinserted into the tibia and was placed over the anatomic footprint of the medial meniscus root by hooking the posterior tibial cortex and setting the guide to 60mm off the posterior cortex. We then used a 6.45mm FlipCutter to drill into the tibia and create a 11mm socket for the meniscus root. A FiberStick was passed through our tibial tunnel and into the joint. This was retrieved and passed through the anterolateral portal.   Next, we passed a Ceterix Novostitch 2-0 stitch just lateral to the root of the medial meniscus in a mattress fashion. A luggage tag stitch was created and passed into the joint, which showed excellent purchase of the meniscus root. This stitch and configuration was repeated ~78mm medial to the first stitch. We ensured there was no soft tissue bridge between the sutures and pulled the passing stitch from the [place] through the anteromedial portal. We then used the passing stitch to bring the sutures in the meniscus out through the tibial tunnel. These were then passed through an [employer] anchor. Anchor hole was drilled ~3cm distal previously drilled tibial tunnel. Anchor was inserted with appropriate tension while visualizing the repair with the arthroscope, and this achieved excellent interference fit. The meniscus root was probed and found to be stable.   Any loose bony debris was  removed from the knee joint with a shaver and excess fluid was evacuated from the joint. Closure of the portals with 3-0 Nylon was performed. The sartorius fascia was closed 0 vicryl. The subdermal layer of the tibial incision was closed with 2-0 vicryl and the skin was closed with 4-0 Monocryl in a running fashion. The knee was injected with local anesthetic. Xeroform gauze and dry sterile dressings were applied. A PolarCare and hinged knee brace were also applied.   Instrument, sponge, and needle counts were correct prior to wound closure and at the conclusion of the case.   Of note, assistance from a PA was essential to performing the surgery. PA assisted with patient position, retraction, and instrumentation.  DISPOSITION: PACU - hemodynamically stable.   POSTOPERATIVE PLAN: The patient will be discharged home today.    Non-weight bearing x 6 weeks. ASA for DVT ppx x 4 weeks, Narcotic medication, NSAID, and acetaminophen as discussed pre-operatively. The patient will be attending physical therapy at [facility] twice weekly beginning 3-4 days post-op. Physical therapy per Meniscus Repair (meniscus root repair) Protocol.   Patient to return to clinic 10-14 days postop for suture removal.

## 2017-01-18 NOTE — Anesthesia Postprocedure Evaluation (Signed)
Anesthesia Post Note  Patient: Theresa Lester  Procedure(s) Performed: KNEE ARTHROSCOPY WITH MEDIAL MENISECTOMY ROOT REPAIR CHONDROPLASTY (Left )  Patient location during evaluation: PACU Anesthesia Type: Regional Level of consciousness: awake and alert Pain management: pain level controlled Vital Signs Assessment: post-procedure vital signs reviewed and stable Respiratory status: spontaneous breathing, nonlabored ventilation, respiratory function stable and patient connected to nasal cannula oxygen Cardiovascular status: blood pressure returned to baseline and stable Postop Assessment: no apparent nausea or vomiting Anesthetic complications: no    Remmi Armenteros

## 2017-01-18 NOTE — Discharge Instructions (Signed)
Arthroscopic Knee Surgery - Meniscus Repair  Post-Op Instructions  1. Bracing or crutches: Crutches will be provided at the time of discharge from the surgery center.   2. Ice: You may be provided with a device [facility]) that allows you to ice the affected area effectively. Otherwise you can ice manually.   3. Driving:  Plan on not driving for at least four weeks. Please note that you are advised NOT to drive while taking narcotic pain medications as you may be impaired and unsafe to drive.  4. Activity: Ankle pumps several times an hour while awake to prevent blood clots. Weight bearing: NO WEIGHT BEARING. Use crutches for 6 weeks. Do not flex your knee past 90 degrees until cleared by your therapist. Elevate knee above heart level as much as possible for one week. Avoid standing more than 5 minutes (consecutively) for the first week. No exercise involving the knee until cleared by the surgeon or physical therapist.  Avoid long distance travel for 4 weeks.  5. Medications:  - You have been provided a prescription for narcotic pain medicine. After surgery, take 1-2 narcotic tablets every 4 hours if needed for severe pain.  - A prescription for anti-nausea medication will be provided in case the narcotic medicine causes nausea - take 1 tablet every 6 hours only if nauseated.  - Take ibuprofen 800 mg every 8 hours with food to reduce post-operative knee swelling. DO NOT STOP IBUPROFEN POST-OP UNTIL INSTRUCTED TO DO SO at first post-op office visit (10-14 days after surgery).  - Take enteric coated aspirin 325 mg once daily for 4 weeks to prevent blood clots.  -Take tylenol 1000 every 8 hours for pain.  May stop tylenol 3 days after surgery or when you are having minimal pain.  If you are taking prescription medication for anxiety, depression, insomnia, muscle spasm, chronic pain, or for attention deficit disorder you are advised that you are at a higher risk of adverse effects with use of  narcotics post-op, including narcotic addiction/dependence, depressed breathing, death. If you use non-prescribed substances: alcohol, marijuana, cocaine, heroin, methamphetamines, etc., you are at a higher risk of adverse effects with use of narcotics post-op, including narcotic addiction/dependence, depressed breathing, death. You are advised that taking > 50 morphine milligram equivalents (MME) of narcotic pain medication per day results in twice the risk of overdose or death. For your prescription provided: oxycodone 5 mg - taking more than 6 tablets per day. Be advised that we will prescribe narcotics short-term, for acute post-operative pain only - 1 week for minor operations such as knee arthroscopy for meniscus tear resection, and 3 weeks for major operations such as knee repair/reconstruction surgeries.   6. Bandages: The physical therapist should change the bandages at the first post-op appointment. If needed, the dressing supplies have been provided to you.  7. Physical Therapy: 2 times per week for the first 4 weeks, then 1-2 times per week from weeks 4-8 post-op. Therapy typically starts on post operative Day 3 or 4. You have been provided an order for physical therapy. The therapist will provide home exercises.  8. Work: May return to full work when off of crutches. May do light duty/desk job in approximately 1-2 weeks when off of narcotics, pain is well-controlled, and swelling has decreased.  9. Post-Op Appointments: Your first post-op appointment will be with Dr. Posey Pronto in approximately 2 weeks time.   If you find that they have not been scheduled please call the Orthopaedic Appointment front  desk at [phone].    General Anesthesia, Adult, Care After These instructions provide you with information about caring for yourself after your procedure. Your health care provider may also give you more specific instructions. Your treatment has been planned according to current medical  practices, but problems sometimes occur. Call your health care provider if you have any problems or questions after your procedure. What can I expect after the procedure? After the procedure, it is common to have:  Vomiting.  A sore throat.  Mental slowness.  It is common to feel:  Nauseous.  Cold or shivery.  Sleepy.  Tired.  Sore or achy, even in parts of your body where you did not have surgery.  Follow these instructions at home: For at least 24 hours after the procedure:  Do not: ? Participate in activities where you could fall or become injured. ? Drive. ? Use heavy machinery. ? Drink alcohol. ? Take sleeping pills or medicines that cause drowsiness. ? Make important decisions or sign legal documents. ? Take care of children on your own.  Rest. Eating and drinking  If you vomit, drink water, juice, or soup when you can drink without vomiting.  Drink enough fluid to keep your urine clear or pale yellow.  Make sure you have little or no nausea before eating solid foods.  Follow the diet recommended by your health care provider. General instructions  Have a responsible adult stay with you until you are awake and alert.  Return to your normal activities as told by your health care provider. Ask your health care provider what activities are safe for you.  Take over-the-counter and prescription medicines only as told by your health care provider.  If you smoke, do not smoke without supervision.  Keep all follow-up visits as told by your health care provider. This is important. Contact a health care provider if:  You continue to have nausea or vomiting at home, and medicines are not helpful.  You cannot drink fluids or start eating again.  You cannot urinate after 8-12 hours.  You develop a skin rash.  You have fever.  You have increasing redness at the site of your procedure. Get help right away if:  You have difficulty breathing.  You have  chest pain.  You have unexpected bleeding.  You feel that you are having a life-threatening or urgent problem. This information is not intended to replace advice given to you by your health care provider. Make sure you discuss any questions you have with your health care provider. Document Released: 04/10/2000 Document Revised: 06/07/2015 Document Reviewed: 12/17/2014 Elsevier Interactive Patient Education  [employer].

## 2017-01-18 NOTE — Anesthesia Procedure Notes (Signed)
Procedure Name: LMA Insertion Date/Time: 01/18/2017 1:22 PM Performed by: Cameron Ali, CRNA Pre-anesthesia Checklist: Patient identified, Emergency Drugs available, Suction available, Timeout performed and Patient being monitored Patient Re-evaluated:Patient Re-evaluated prior to induction Oxygen Delivery Method: Circle system utilized Preoxygenation: Pre-oxygenation with 100% oxygen Induction Type: IV induction Ventilation: Mask ventilation without difficulty LMA: LMA inserted LMA Size: 3.0 Number of attempts: 1 Placement Confirmation: positive ETCO2 and breath sounds checked- equal and bilateral Tube secured with: Tape Comments: Very small mouth opening.

## 2017-01-18 NOTE — Transfer of Care (Signed)
Immediate Anesthesia Transfer of Care Note  Patient: Theresa Lester  Procedure(s) Performed: KNEE ARTHROSCOPY WITH MEDIAL MENISECTOMY ROOT REPAIR CHONDROPLASTY (Left )  Patient Location: PACU  Anesthesia Type: General, Regional  Level of Consciousness: awake, alert  and patient cooperative  Airway and Oxygen Therapy: Patient Spontanous Breathing and Patient connected to supplemental oxygen  Post-op Assessment: Post-op Vital signs reviewed, Patient's Cardiovascular Status Stable, Respiratory Function Stable, Patent Airway and No signs of Nausea or vomiting  Post-op Vital Signs: Reviewed and stable  Complications: No apparent anesthesia complications

## 2017-01-18 NOTE — Anesthesia Procedure Notes (Signed)
Anesthesia Regional Block: Adductor canal block   Pre-Anesthetic Checklist: ,, timeout performed, Correct Patient, Correct Site, Correct Laterality, Correct Procedure, Correct Position, site marked, Risks and benefits discussed,  Surgical consent,  Pre-op evaluation,  At surgeon's request and post-op pain management  Laterality: Left  Prep: chloraprep       Needles:  Injection technique: Single-shot  Needle Type: Stimiplex     Needle Length: 9cm  Needle Gauge: 21     Additional Needles:   Procedures:,,,, ultrasound used (permanent image in chart),,,,  Narrative:  Start time: 01/18/2017 12:11 PM End time: 01/18/2017 12:16 PM Injection made incrementally with aspirations every 5 mL.  Performed by: Personally  Anesthesiologist: Fidel Levy, MD  Additional Notes: Functioning IV was confirmed and monitors applied. Ultrasound guidance: relevant anatomy identified, needle position confirmed, local anesthetic spread visualized around nerve(s)., vascular puncture avoided.  Image printed for medical record.  Negative aspiration and no paresthesias; incremental administration of local anesthetic. The patient tolerated the procedure well. Vitals signes recorded in RN notes.

## 2017-01-18 NOTE — H&P (Signed)
Paper H&P to be scanned into permanent record. H&P reviewed. No significant changes noted.  

## 2017-01-19 ENCOUNTER — Encounter: Payer: Self-pay | Admitting: Orthopedic Surgery

## 2017-01-22 DIAGNOSIS — Z9889 Other specified postprocedural states: Secondary | ICD-10-CM | POA: Diagnosis not present

## 2017-01-24 DIAGNOSIS — Z9889 Other specified postprocedural states: Secondary | ICD-10-CM | POA: Diagnosis not present

## 2017-01-31 DIAGNOSIS — Z9889 Other specified postprocedural states: Secondary | ICD-10-CM | POA: Diagnosis not present

## 2017-01-31 DIAGNOSIS — M25562 Pain in left knee: Secondary | ICD-10-CM | POA: Diagnosis not present

## 2017-02-07 DIAGNOSIS — Z9889 Other specified postprocedural states: Secondary | ICD-10-CM | POA: Diagnosis not present

## 2017-02-15 DIAGNOSIS — Z9889 Other specified postprocedural states: Secondary | ICD-10-CM | POA: Diagnosis not present

## 2017-02-21 DIAGNOSIS — Z9889 Other specified postprocedural states: Secondary | ICD-10-CM | POA: Diagnosis not present

## 2017-03-05 DIAGNOSIS — Z9889 Other specified postprocedural states: Secondary | ICD-10-CM | POA: Diagnosis not present

## 2017-03-14 DIAGNOSIS — Z9889 Other specified postprocedural states: Secondary | ICD-10-CM | POA: Diagnosis not present

## 2017-03-21 DIAGNOSIS — Z9889 Other specified postprocedural states: Secondary | ICD-10-CM | POA: Diagnosis not present

## 2017-03-28 DIAGNOSIS — Z9889 Other specified postprocedural states: Secondary | ICD-10-CM | POA: Diagnosis not present

## 2017-04-04 DIAGNOSIS — Z9889 Other specified postprocedural states: Secondary | ICD-10-CM | POA: Diagnosis not present

## 2017-04-18 DIAGNOSIS — Z9889 Other specified postprocedural states: Secondary | ICD-10-CM | POA: Diagnosis not present

## 2017-04-25 DIAGNOSIS — S83232A Complex tear of medial meniscus, current injury, left knee, initial encounter: Secondary | ICD-10-CM | POA: Diagnosis not present

## 2017-06-22 ENCOUNTER — Emergency Department
Admission: EM | Admit: 2017-06-22 | Discharge: 2017-06-22 | Disposition: A | Discharge status: Home / Self Care | Payer: Commercial Managed Care - PPO | Attending: Emergency Medicine | Admitting: Emergency Medicine

## 2017-06-22 ENCOUNTER — Other Ambulatory Visit: Payer: Self-pay

## 2017-06-22 ENCOUNTER — Emergency Department: Payer: Commercial Managed Care - PPO

## 2017-06-22 ENCOUNTER — Encounter: Payer: Self-pay | Admitting: Emergency Medicine

## 2017-06-22 DIAGNOSIS — M25562 Pain in left knee: Secondary | ICD-10-CM | POA: Insufficient documentation

## 2017-06-22 DIAGNOSIS — Z79899 Other long term (current) drug therapy: Secondary | ICD-10-CM | POA: Diagnosis not present

## 2017-06-22 MED ORDER — OXYCODONE-ACETAMINOPHEN 5-325 MG PO TABS
1.0000 | ORAL_TABLET | Freq: Once | ORAL | Status: DC
  Filled 2017-06-22: qty 1

## 2017-06-22 MED ORDER — KETOROLAC TROMETHAMINE 30 MG/ML IJ SOLN
30.0000 mg | Freq: Once | INTRAMUSCULAR | Status: AC
  Administered 2017-06-22: 30 mg via INTRAMUSCULAR
  Filled 2017-06-22: qty 1

## 2017-06-22 MED ORDER — KETOROLAC TROMETHAMINE 10 MG PO TABS: 10.0000 mg | ORAL_TABLET | Freq: Four times a day (QID) | ORAL | 0 refills | Status: DC | PRN

## 2017-06-22 NOTE — ED Triage Notes (Signed)
Pt states felt a popping sensation in her L knee yesterday, today c/o pain with walking.

## 2017-06-22 NOTE — ED Notes (Signed)
See triage note  States she felt a pop to left knee yesterday while walking  States she did not fall  states pain is anterior and lateral

## 2017-06-22 NOTE — ED Notes (Signed)
Pt verbalized understanding of discharge instructions. NAD at this time. 

## 2017-06-22 NOTE — ED Provider Notes (Signed)
[facility] Emergency Department Provider Note  ____________________________________________  Time seen: Approximately 3:10 PM  I have reviewed the triage vital signs and the nursing notes.   HISTORY  Chief Complaint Knee Pain    HPI Theresa Lester is a 47 y.o. female that presents to the emergency department for evaluation of left knee pain for 1 day.  Patient was walking yesterday and felt a pop.  She has had pain on and off since then.  Pain is worse when she has been sitting or standing for a long time.  Pain is primarily at the front of her knee over the joint line.  She had meniscal surgery in January.  She is supposed to do knee exercises twice a week.  She called her orthopedist this morning and has an appointment next Wednesday.  No fever, chills, swelling.  Past Medical History:  Diagnosis Date  . Acoustic neuroma ([place])   . Calculus of gallbladder   . GERD (gastroesophageal reflux disease)   . Hemorrhoids, internal   . Restless leg     Patient Active Problem List   Diagnosis Date Noted  . Plantar fasciitis of right foot 10/11/2016  . Tricompartment osteoarthritis of left knee 10/11/2016  . Left axillary hidradenitis 11/05/2015  . Hemifacial spasm   . TIA (transient ischemic attack) 06/11/2015  . Episodic tension-type headache, not intractable 04/14/2015  . Unilateral vestibular schwannoma ([place]) 01/12/2015  . Tinnitus 11/25/2014  . Mixed migraine and muscle contraction headache 10/20/2014  . Angioneurotic edema 10/19/2014  . Arthralgia of hip or thigh 10/19/2014  . Calculus of gallbladder 10/19/2014  . Acid reflux 10/19/2014  . Blood glucose elevated 10/19/2014  . Hemorrhoids, internal 10/19/2014  . Restless leg 10/19/2014  . Fast heart beat 10/19/2014    Past Surgical History:  Procedure Laterality Date  . BILATERAL CARPAL TUNNEL RELEASE    . KNEE ARTHROSCOPY WITH MEDIAL MENISECTOMY Left 01/18/2017   Procedure: KNEE  ARTHROSCOPY WITH MEDIAL MENISECTOMY ROOT REPAIR CHONDROPLASTY;  Surgeon: Leim Fabry, MD;  Location: [place];  Service: Orthopedics;  Laterality: Left;  SUPINE WITH ACL LEG HOLDER ARTHREX ALEX  FLIP CUTTER, MENISCUS ROOT GUIDE STRYKER [place] CETERIX AIR  ANESTHESIA: ADDUCTOR CANAL NERVE BLOCK  . TUBAL LIGATION      Prior to Admission medications   Medication Sig Start Date End Date Taking? Authorizing Provider  acetaminophen (TYLENOL) 500 MG tablet Take 2 tablets (1,000 mg total) by mouth every 8 (eight) hours. 01/18/17 01/18/18  Leim Fabry, MD  carbamazepine (TEGRETOL XR) 100 MG 12 hr tablet Take 1 tablet (100 mg total) by mouth 2 (two) times daily. 06/12/15   Loletha Grayer, MD  ketorolac (TORADOL) 10 MG tablet Take 1 tablet (10 mg total) by mouth every 6 (six) hours as needed. 06/22/17   Laban Emperor, PA-C  ondansetron (ZOFRAN ODT) 4 MG disintegrating tablet Take 1 tablet (4 mg total) by mouth every 8 (eight) hours as needed for nausea or vomiting. 01/18/17   Leim Fabry, MD  oxyCODONE (ROXICODONE) 5 MG immediate release tablet Take 1-2 tablets (5-10 mg total) by mouth every 4 (four) hours as needed (pain). 01/18/17 01/18/18  Leim Fabry, MD    Allergies Patient has no known allergies.  Family History  Problem Relation Age of Onset  . Hypertension Mother   . Diabetes Mother   . Breast cancer Neg Hx     Social History Social History   Tobacco Use  . Smoking status: Never Smoker  . Smokeless tobacco:  Never Used  Substance Use Topics  . Alcohol use: No    Alcohol/week: 0.0 oz  . Drug use: No     Review of Systems  Constitutional: No fever/chills Gastrointestinal: No abdominal pain.  No nausea, no vomiting.  Musculoskeletal: Positive for knee pain.  Skin: Negative for rash, abrasions, lacerations, ecchymosis. Neurological: Negative for headaches, numbness or tingling   ____________________________________________   PHYSICAL EXAM:  VITAL SIGNS: ED Triage  Vitals  Enc Vitals Group     BP 06/22/17 1328 122/76     Pulse Rate 06/22/17 1328 75     Resp 06/22/17 1328 18     Temp 06/22/17 1328 98.5 F (36.9 C)     Temp Source 06/22/17 1328 Oral     SpO2 06/22/17 1328 99 %     Weight 06/22/17 1329 174 lb (78.9 kg)     Height 06/22/17 1329 5\' 3"  (1.6 m)     Head Circumference --      Peak Flow --      Pain Score 06/22/17 1329 9     Pain Loc --      Pain Edu? --      Excl. in [place]? --      Constitutional: Alert and oriented. Well appearing and in no acute distress. Eyes: Conjunctivae are normal. PERRL. EOMI. Head: Atraumatic. ENT:      Ears:      Nose: No congestion/rhinnorhea.      Mouth/Throat: Mucous membranes are moist.  Neck: No stridor.   Cardiovascular: Normal rate, regular rhythm.  Good peripheral circulation. Respiratory: Normal respiratory effort without tachypnea or retractions. Lungs CTAB. Good air entry to the bases with no decreased or absent breath sounds. Musculoskeletal: Full range of motion to all extremities. No gross deformities appreciated.  Full range of motion of knee without pain. No visible swelling or erythema.  Tenderness to palpation over medial and lateral joint line.  No tenderness to palpation over posterior knee or calf.  Neurologic:  Normal speech and language. No gross focal neurologic deficits are appreciated.  Skin:  Skin is warm, dry and intact. No rash noted. Psychiatric: Mood and affect are normal. Speech and behavior are normal. Patient exhibits appropriate insight and judgement.   ____________________________________________   LABS (all labs ordered are listed, but only abnormal results are displayed)  Labs Reviewed - No data to display ____________________________________________  EKG   ____________________________________________  RADIOLOGY Robinette Haines, personally viewed and evaluated these images (plain radiographs) as part of my medical decision making, as well as reviewing the  written report by the radiologist.  Dg Knee Complete 4 Views Left  Result Date: 06/22/2017 CLINICAL DATA:  Knee pain. EXAM: LEFT KNEE - COMPLETE 4+ VIEW COMPARISON:  10/11/2016 FINDINGS: No evidence for an acute fracture. No subluxation or dislocation. Loss of joint space noted medial compartment. Tricompartmental hypertrophic spurring has progressed in the interval since the prior study. No evidence for joint effusion. IMPRESSION: Tricompartmental degenerative changes without acute bony abnormality. Electronically Signed   By: Misty Stanley M.D.   On: 06/22/2017 13:56    ____________________________________________    PROCEDURES  Procedure(s) performed:    Procedures    Medications  oxyCODONE-acetaminophen (PERCOCET/ROXICET) 5-325 MG per tablet 1 tablet (1 tablet Oral Not Given 06/22/17 1434)  ketorolac (TORADOL) 30 MG/ML injection 30 mg (30 mg Intramuscular Given 06/22/17 1431)     ____________________________________________   INITIAL IMPRESSION / ASSESSMENT AND PLAN / ED COURSE  Pertinent labs & imaging results that were  available during my care of the patient were reviewed by me and considered in my medical decision making (see chart for details).  Review of the [place] CSRS was performed in accordance of the [place] prior to dispensing any controlled drugs.    Patient presented to the emergency department for evaluation of knee pain for 1 day.  Vital signs and exam are reassuring.  X-ray consistent with arthritic changes.  Findings were discussed with patient.  She was given IM Toradol and knee was ace wrapped.  Symptoms are likely inflammatory.  She has an appointment with orthopedics on Wednesday.  Patient will be discharged home with prescriptions for Toradol. Patient is to follow up with orthopedics as directed. Patient is given ED precautions to return to the ED for any worsening or new symptoms.     ____________________________________________  FINAL CLINICAL IMPRESSION(S)  / ED DIAGNOSES  Final diagnoses:  Acute pain of left knee      NEW MEDICATIONS STARTED DURING THIS VISIT:  ED Discharge Orders        Ordered    ketorolac (TORADOL) 10 MG tablet  Every 6 hours PRN     06/22/17 1444    Knee brace     06/22/17 1444          This chart was dictated using voice recognition software/Dragon. Despite best efforts to proofread, errors can occur which can change the meaning. Any change was purely unintentional.    Laban Emperor, PA-C 06/22/17 1514    Nena Polio, MD 06/22/17 [phone]

## 2017-06-27 DIAGNOSIS — M25562 Pain in left knee: Secondary | ICD-10-CM | POA: Diagnosis not present

## 2017-07-15 ENCOUNTER — Encounter

## 2017-07-27 DIAGNOSIS — L03012 Cellulitis of left finger: Secondary | ICD-10-CM | POA: Diagnosis not present

## 2017-09-11 ENCOUNTER — Other Ambulatory Visit (HOSPITAL_COMMUNITY)
Admission: RE | Admit: 2017-09-11 | Discharge: 2017-09-11 | Disposition: A | Discharge status: Home / Self Care | Payer: Commercial Managed Care - PPO | Source: Ambulatory Visit | Attending: Internal Medicine | Admitting: Internal Medicine

## 2017-09-11 ENCOUNTER — Ambulatory Visit (INDEPENDENT_AMBULATORY_CARE_PROVIDER_SITE_OTHER): Payer: Commercial Managed Care - PPO | Admitting: Internal Medicine

## 2017-09-11 ENCOUNTER — Encounter: Payer: Self-pay | Admitting: Internal Medicine

## 2017-09-11 VITALS — BP 134/78 | HR 82 | Ht 63.0 in | Wt 178.0 lb

## 2017-09-11 DIAGNOSIS — Z0000 Encounter for general adult medical examination without abnormal findings: Secondary | ICD-10-CM

## 2017-09-11 DIAGNOSIS — Z124 Encounter for screening for malignant neoplasm of cervix: Secondary | ICD-10-CM

## 2017-09-11 DIAGNOSIS — Z1231 Encounter for screening mammogram for malignant neoplasm of breast: Secondary | ICD-10-CM | POA: Diagnosis not present

## 2017-09-11 DIAGNOSIS — Z23 Encounter for immunization: Secondary | ICD-10-CM

## 2017-09-11 DIAGNOSIS — D333 Benign neoplasm of cranial nerves: Secondary | ICD-10-CM

## 2017-09-11 DIAGNOSIS — Z1239 Encounter for other screening for malignant neoplasm of breast: Secondary | ICD-10-CM

## 2017-09-11 LAB — POCT URINALYSIS DIPSTICK
Bilirubin, UA: NEGATIVE
Blood, UA: NEGATIVE
Glucose, UA: NEGATIVE
Ketones, UA: NEGATIVE
Leukocytes, UA: NEGATIVE
Nitrite, UA: NEGATIVE
Protein, UA: NEGATIVE
Spec Grav, UA: 1.01 (ref 1.010–1.025)
Urobilinogen, UA: 0.2 E.U./dL
pH, UA: 6 (ref 5.0–8.0)

## 2017-09-11 NOTE — Progress Notes (Signed)
Date:  09/11/2017   Name:  Theresa Lester   DOB:  1970-06-15   MRN:  [id number]   Chief Complaint: Annual Exam (Breast Exam. Papsmear.) Theresa Lester is a 47 y.o. female who presents today for her Complete Annual Exam. She feels well. She reports exercising doing stretches and leg lifts but little aerobic excercise. She reports she is sleeping well. She is still having regular periods.  She has no breast issues. Last mammogram three years ago.  HPI She is being followed by Neurosurgery for vestibular schwanoma on the right that was partially treated with radiosurgery.  Last MRI showed the tumor was smaller so she is being monitored at intervals. She is on tegretol for right hemi facial spasms that occur 1-2 times per month.   Review of Systems  Constitutional: Negative for chills, fatigue and fever.  HENT: Negative for congestion, ear pain, facial swelling, hearing loss, tinnitus, trouble swallowing and voice change.   Eyes: Negative for visual disturbance.  Respiratory: Negative for cough, chest tightness, shortness of breath and wheezing.   Cardiovascular: Negative for chest pain, palpitations and leg swelling.  Gastrointestinal: Negative for abdominal pain, constipation, diarrhea and vomiting.  Endocrine: Negative for polydipsia and polyuria.  Genitourinary: Negative for dysuria, frequency, genital sores, vaginal bleeding and vaginal discharge.  Musculoskeletal: Negative for arthralgias, gait problem and joint swelling.  Skin: Negative for color change and rash.  Neurological: Negative for dizziness, tremors, light-headedness, numbness and headaches.  Hematological: Negative for adenopathy. Does not bruise/bleed easily.  Psychiatric/Behavioral: Negative for dysphoric mood and sleep disturbance. The patient is not nervous/anxious.     Patient Active Problem List   Diagnosis Date Noted  . Plantar fasciitis of right foot 10/11/2016  . Tricompartment  osteoarthritis of left knee 10/11/2016  . Left axillary hidradenitis 11/05/2015  . Hemifacial spasm   . TIA (transient ischemic attack) 06/11/2015  . Episodic tension-type headache, not intractable 04/14/2015  . Unilateral vestibular schwannoma ([place]) 01/12/2015  . Tinnitus 11/25/2014  . Mixed migraine and muscle contraction headache 10/20/2014  . Angioneurotic edema 10/19/2014  . Arthralgia of hip or thigh 10/19/2014  . Calculus of gallbladder 10/19/2014  . Acid reflux 10/19/2014  . Blood glucose elevated 10/19/2014  . Hemorrhoids, internal 10/19/2014  . Restless leg 10/19/2014  . Fast heart beat 10/19/2014    No Known Allergies  Past Surgical History:  Procedure Laterality Date  . BILATERAL CARPAL TUNNEL RELEASE    . KNEE ARTHROSCOPY WITH MEDIAL MENISECTOMY Left 01/18/2017   Procedure: KNEE ARTHROSCOPY WITH MEDIAL MENISECTOMY ROOT REPAIR CHONDROPLASTY;  Surgeon: Leim Fabry, MD;  Location: [place];  Service: Orthopedics;  Laterality: Left;  SUPINE WITH ACL LEG HOLDER ARTHREX ALEX  FLIP CUTTER, MENISCUS ROOT GUIDE STRYKER [place] CETERIX AIR  ANESTHESIA: ADDUCTOR CANAL NERVE BLOCK  . TUBAL LIGATION      Social History   Tobacco Use  . Smoking status: Never Smoker  . Smokeless tobacco: Never Used  Substance Use Topics  . Alcohol use: No    Alcohol/week: 0.0 standard drinks  . Drug use: No     Medication list has been reviewed and updated.  Current Meds  Medication Sig  . carbamazepine (TEGRETOL XR) 100 MG 12 hr tablet Take 1 tablet (100 mg total) by mouth 2 (two) times daily.    PHQ 2/9 Scores 09/11/2017 10/11/2016 04/15/2015 02/17/2015  PHQ - 2 Score 0 0 0 0    Physical Exam  Constitutional: She is oriented to person, place, and  time. She appears well-developed and well-nourished. No distress.  HENT:  Head: Normocephalic and atraumatic.  Right Ear: Tympanic membrane and ear canal normal.  Left Ear: Tympanic membrane and ear canal normal.  Nose: Right  sinus exhibits no maxillary sinus tenderness. Left sinus exhibits no maxillary sinus tenderness.  Mouth/Throat: Uvula is midline and oropharynx is clear and moist.  Eyes: Conjunctivae and EOM are normal. Right eye exhibits no discharge. Left eye exhibits no discharge. No scleral icterus.  Neck: Normal range of motion. Carotid bruit is not present. No erythema present. No thyromegaly present.  Cardiovascular: Normal rate, regular rhythm, normal heart sounds and normal pulses.  Pulmonary/Chest: Effort normal. No respiratory distress. She has no wheezes. Right breast exhibits no mass, no nipple discharge, no skin change and no tenderness. Left breast exhibits no mass, no nipple discharge, no skin change and no tenderness.  Abdominal: Soft. Bowel sounds are normal. There is no hepatosplenomegaly. There is no tenderness. There is no CVA tenderness.  Genitourinary: Rectum normal, vagina normal and uterus normal. There is no rash, tenderness or lesion on the right labia. There is no rash, tenderness or lesion on the left labia. Cervix exhibits discharge and friability. Cervix exhibits no motion tenderness. Right adnexum displays no mass, no tenderness and no fullness. Left adnexum displays no mass, no tenderness and no fullness.  Musculoskeletal: Normal range of motion.  Lymphadenopathy:    She has no cervical adenopathy.    She has no axillary adenopathy.  Neurological: She is alert and oriented to person, place, and time. She has normal reflexes. No cranial nerve deficit or sensory deficit.  Skin: Skin is warm, dry and intact. No rash noted.  Psychiatric: She has a normal mood and affect. Her speech is normal and behavior is normal. Thought content normal.  Nursing note and vitals reviewed.   BP 134/78 (BP Location: Right Arm, Patient Position: Sitting, Cuff Size: Normal)   Pulse 82   Ht 5\' 3"  (1.6 m)   Wt 178 lb (80.7 kg)   LMP 09/02/2017 (Exact Date)   SpO2 100%   BMI 31.53 kg/m   Assessment  and Plan: 1. Annual physical exam Normal exam Recommend regular aerobic exercise - CBC with Differential/Platelet - Comprehensive metabolic panel - Lipid panel - TSH - POCT urinalysis dipstick  2. Breast cancer screening Pt to schedule - MM 3D SCREEN BREAST BILATERAL; Future  3. Papanicolaou smear for cervical cancer screening - Cytology - PAP  4. Need for diphtheria-tetanus-pertussis (Tdap) vaccine - Tdap vaccine greater than or equal to 7yo IM  5. Unilateral vestibular schwannoma ([facility]) Stable on tegretol with control of spasm   No orders of the defined types were placed in this encounter.   Partially dictated using Editor, commissioning. Any errors are unintentional.  Halina Maidens, MD [facility]  09/11/2017

## 2017-09-11 NOTE — Patient Instructions (Signed)

## 2017-09-12 LAB — CYTOLOGY - PAP: Diagnosis: NEGATIVE

## 2017-09-20 ENCOUNTER — Other Ambulatory Visit: Payer: Self-pay | Admitting: Radiation Therapy

## 2017-09-20 DIAGNOSIS — D333 Benign neoplasm of cranial nerves: Secondary | ICD-10-CM

## 2017-09-24 NOTE — Progress Notes (Signed)
LVM to remind patient to have labs done and to call back if any issues.

## 2017-09-25 ENCOUNTER — Ambulatory Visit
Admission: RE | Admit: 2017-09-25 | Discharge: 2017-09-25 | Disposition: A | Discharge status: Home / Self Care | Payer: Commercial Managed Care - PPO | Source: Ambulatory Visit | Attending: Internal Medicine | Admitting: Internal Medicine

## 2017-09-25 DIAGNOSIS — Z1231 Encounter for screening mammogram for malignant neoplasm of breast: Secondary | ICD-10-CM | POA: Insufficient documentation

## 2017-09-25 DIAGNOSIS — Z1239 Encounter for other screening for malignant neoplasm of breast: Secondary | ICD-10-CM

## 2017-10-05 ENCOUNTER — Other Ambulatory Visit: Payer: Commercial Managed Care - PPO

## 2017-11-02 ENCOUNTER — Telehealth: Payer: Self-pay | Admitting: Radiation Therapy

## 2017-11-02 NOTE — Telephone Encounter (Signed)
Left a voicemail requesting a call back. Theresa Lester cancelled her September MRI and follow-up with Dr. [name] due to outstanding debt at his office. The office has a policy that if there is an outstanding debt, a visit cannot be scheduled until a payment made.  I have not been able to reach her to reschedule that visit. Per Dr. Donnella Bi request, I called again asking if we could reschedule the missed MRI and have her seen at our office for follow-up instead.   Mont Dutton R.T.(R)(T)

## 2017-11-06 ENCOUNTER — Other Ambulatory Visit: Payer: Self-pay | Admitting: Urology

## 2017-11-06 MED ORDER — LORAZEPAM 0.5 MG PO TABS: 0.5000 mg | ORAL_TABLET | ORAL | 0 refills | Status: DC | PRN

## 2017-11-14 ENCOUNTER — Other Ambulatory Visit: Payer: Self-pay | Admitting: Radiation Therapy

## 2017-11-15 ENCOUNTER — Ambulatory Visit
Admission: RE | Admit: 2017-11-15 | Discharge: 2017-11-15 | Disposition: A | Discharge status: Home / Self Care | Payer: Commercial Managed Care - PPO | Source: Ambulatory Visit | Attending: Radiation Oncology | Admitting: Radiation Oncology

## 2017-11-15 DIAGNOSIS — D333 Benign neoplasm of cranial nerves: Secondary | ICD-10-CM | POA: Diagnosis not present

## 2017-11-15 MED ORDER — GADOBENATE DIMEGLUMINE 529 MG/ML IV SOLN
17.0000 mL | Freq: Once | INTRAVENOUS | Status: AC | PRN
  Administered 2017-11-15: 17 mL via INTRAVENOUS

## 2017-11-21 ENCOUNTER — Other Ambulatory Visit: Payer: Self-pay

## 2017-11-21 ENCOUNTER — Encounter: Payer: Self-pay | Admitting: Urology

## 2017-11-21 ENCOUNTER — Ambulatory Visit
Admission: RE | Admit: 2017-11-21 | Discharge: 2017-11-21 | Disposition: A | Discharge status: Home / Self Care | Payer: Commercial Managed Care - PPO | Source: Ambulatory Visit | Attending: Urology | Admitting: Urology

## 2017-11-21 VITALS — BP 128/93 | HR 77 | Temp 97.9°F | Resp 20 | Ht 63.0 in | Wt 181.0 lb

## 2017-11-21 DIAGNOSIS — H9191 Unspecified hearing loss, right ear: Secondary | ICD-10-CM | POA: Diagnosis not present

## 2017-11-21 DIAGNOSIS — Z08 Encounter for follow-up examination after completed treatment for malignant neoplasm: Secondary | ICD-10-CM | POA: Diagnosis not present

## 2017-11-21 DIAGNOSIS — Z79899 Other long term (current) drug therapy: Secondary | ICD-10-CM | POA: Diagnosis not present

## 2017-11-21 DIAGNOSIS — D333 Benign neoplasm of cranial nerves: Secondary | ICD-10-CM | POA: Diagnosis not present

## 2017-11-21 MED ORDER — CARBAMAZEPINE ER 100 MG PO TB12: 100.0000 mg | ORAL_TABLET | Freq: Three times a day (TID) | ORAL | 6 refills | Status: DC

## 2017-11-21 NOTE — Progress Notes (Signed)
Radiation Oncology         [phone]) [phone] ________________________________  Name: Theresa Lester MRN: [id number]  Date: 11/21/2017  DOB: 1970-07-15  Post Treatment Note  CC: Theresa Hess, MD  Theresa Gamma, MD  Diagnosis:   47 yo woman with right intracanicular 7 mm vestibular schwannoma and intact right hearing.  Interval Since Last Radiation:  2 years and 9 months  03/01/2015-03/10/2015: Stereotactic radiosurgery to the 7 mm vestibular schwannoma to 25 Gy in 5 fractions of 5 Gy each.  Narrative:  The patient returns today for routine follow-up.  She had been receiving her routine follow-up with Dr. Sherwood Lester but due to some financial issues was unable to schedule her annual appointment with his office this year.           In summary, she initially presented in November 2016 with complaints of headache, tinnitus, and right sided hearing loss ongoing for 6 months.  She had an MRI of the brain on 01/05/2015 revealing a 7 mm mass in the right internal auditory canal consistent with acoustic neuroma.  She met with Dr. Sherwood Lester who recommended fractionated stereotactic radiosurgery and this was completed under the coordinated care of Dr. Tammi Lester in February 2017.   She tolerated treatment very well but unfortunately developed some right-sided facial twitching/spasms approximately 3 months after treatment.  This was treated with Tegretol 100 mg p.o. twice daily which she has continued taking since that time.  A follow-up MRI around that time showed enlargement of the schwannoma with enhancement extending distally along the nerve roots within the internal auditory canal.  She was treated with a course of Decadron as well as continuation of the Tegretol.  The Decadron was gradually tapered and eventually discontinued after a couple of months.  Follow-up MRI scans since that time have shown continued improvement in the treated area without evidence of disease progression or recurrence.                On review of systems, the patient states that she is doing very well overall.  She continues to tolerate the Tegretol and does report that she will have occasional mild facial spasms once or twice a month if she misses a dose of her medication.  Other than that, she does not complain of any significant symptoms.  She does continue with decreased hearing on the right but this has remained unchanged since the time of treatment.  She specifically denies any headaches or tinnitus.  ALLERGIES:  has No Known Allergies.  Meds: Current Outpatient Medications  Medication Sig Dispense Refill  . carbamazepine (TEGRETOL XR) 100 MG 12 hr tablet Take 1 tablet (100 mg total) by mouth 2 (two) times daily. 60 tablet 0  . LORazepam (ATIVAN) 0.5 MG tablet Take 1 tablet (0.5 mg total) by mouth as needed for anxiety (30 minutes before MRI scans. May repeat once if needed just prior to scan.). 10 tablet 0   No current facility-administered medications for this encounter.     Physical Findings:  height is 5' 3"  (1.6 m) and weight is 181 lb (82.1 kg). Her oral temperature is 97.9 F (36.6 C). Her blood pressure is 128/93 (abnormal) and her pulse is 77. Her respiration is 20 and oxygen saturation is 99%.  Pain Assessment Pain Loc: Head/10 In general this is a well appearing Asian female in no acute distress.  She's alert and oriented x4 and appropriate throughout the examination.  EOMs are intact bilaterally and PERRLA. Her facial movement  is symmetrical on exam and there is no obvious focal weakness.  She appears grossly neurologically intact.  Cardiopulmonary assessment is negative for acute distress and she exhibits normal effort.   Lab Findings: Lab Results  Component Value Date   WBC 7.4 06/11/2015   HGB 13.8 06/11/2015   HCT 40.9 06/11/2015   MCV 87.4 06/11/2015   PLT 270 06/11/2015     Radiographic Findings: Mr Theresa Lester DU Contrast  Result Date: 11/16/2017 CLINICAL DATA:  Follow-up vestibular  schwannoma. EXAM: MRI HEAD WITHOUT AND WITH CONTRAST TECHNIQUE: Multiplanar, multiecho pulse sequences of the brain and surrounding structures were obtained without and with intravenous contrast. CONTRAST:  71m MULTIHANCE GADOBENATE DIMEGLUMINE 529 MG/ML IV SOLN COMPARISON:  Multiple priors, most recent 09/20/2016. FINDINGS: Brain: No evidence for acute infarction, hemorrhage, mass lesion, hydrocephalus, or extra-axial fluid. Normal cerebral volume. Single punctate RIGHT temporal white matter lesion, nonspecific and stable. Post infusion, no abnormal enhancement of brain or meninges. Thin-section imaging through the posterior fossa was performed pre and postcontrast, for specific assessment RIGHT vestibular schwannoma. The lesion continues to show slight involution, now 7 x 5 mm cross-section, 4 mm craniocaudal extent, decreased from prior 8 x 6 and 10 x 6 mm cross-section. No other new findings. Vascular: Patent. Skull and upper cervical spine: Unremarkable. Sinuses/Orbits: None. Other: None. IMPRESSION: Continued improvement in RIGHT vestibular schwannoma post SRS, now 7 x 5 x 4 mm (R-L x A-P x C-C). No new findings are evident. Electronically Signed   By: JStaci RighterM.D.   On: 11/16/2017 08:54    Impression/Plan: 1. 47yo woman with right intracanicular 7 mm vestibular schwannoma and intact right hearing. She remains stable both clinically and radiographically.  We reviewed her most recent follow-up MRI brain which shows continued improvement in the right vestibular schwannoma post SRS, now 7 x 5 x 4 mm without evidence of new or progressive disease.  She will continue taking Tegretol 100 mg p.o. twice daily and an updated prescription has been sent to her pharmacy with refills.  We discussed continued follow-up and management under the care and guidance of neuro oncologist, Dr. ZCecil Cobbsand she is in agreement.  We will arrange for a repeat MRI brain in 1 year and a follow-up visit with Dr. VMickeal Lester thereafter.  She knows to call with any questions or concerns in the interim.  2. Hearing loss-right.  She continues with muffled/diminished hearing on the right which is stable since treatment but frustrating nonetheless.  She is interested in a referral to an audiologist for consideration of hearing aids or assistive hearing devices.  I am happy to make that referral.    ANicholos Johns PA-C

## 2017-11-21 NOTE — Progress Notes (Signed)
Theresa Lester 47 y.o. woman with Unilateral vestibular schwannoma completed radiation 03-10-15 review 11-15-17 MRI brain w wo contrast.FU.    Seizures:  No Headaches:Yes today not taking any medication. Nausea: No Dizziness/ataxia: No  Difficulty with hand coordination:No    Focal numbness/weakness: No Visual deficits/changes: No change with vision wears reading glasses Confusion/Memory deficits:Alert and oriented x 4. No memory issues. Wt Readings from Last 3 Encounters:  11/21/17 181 lb (82.1 kg)  09/11/17 178 lb (80.7 kg)  06/22/17 174 lb (78.9 kg)  BP (!) 128/93 (BP Location: Left Arm, Patient Position: Sitting)   Pulse 77   Temp 97.9 F (36.6 C) (Oral)   Resp 20   Ht 5\' 3"  (1.6 m)   Wt 181 lb (82.1 kg)   SpO2 99%   BMI 32.06 kg/m

## 2017-11-27 ENCOUNTER — Telehealth: Payer: Self-pay | Admitting: Radiation Oncology

## 2017-11-27 NOTE — Telephone Encounter (Signed)
Phoned patient to discuss Tegratol per Freeman Caldron, PA-C request. No answer. Left message with my contact information requesting a return call.

## 2017-11-27 NOTE — Telephone Encounter (Signed)
-----   Message from Freeman Caldron, [place] sent at 11/27/2017  8:55 AM EST ----- Regarding: Follow up with patient Sam, Please reach out to Ms. Mensch and let her know that Dr. Mickeal Skinner agrees that she should continue taking the Tegratol twice daily as she has been doing.  She has refills that I sent at the time of her recent follow up visit.  We will repeat imaging in 1 year, as discussed and she will follow up with Dr. Mickeal Skinner thereafter and going forward. Advise to call us if she needs anything in the interim.  Thanks! -Ashlyn ----- Message ----- From: Ventura Sellers, MD Sent: 11/26/2017   8:56 AM EST To: Freeman Caldron, PA-C  Yes, definitely continue therapy.  Thedore Mins  ----- Message ----- From: Freeman Caldron, PA-C Sent: 11/21/2017   5:37 PM EST To: Ventura Sellers, MD  Just FYI since this patient will follow up with you going forward for management of acoustic schwannoma.  She is now almost 3 years out from treatment and continues taking Tegretol for facial spasms- started approximately 3 months post treatment and has continued.  She does report having mild facial spasms if she misses a dose so my inclination was to continue her on the Tegretol.  Do you have any objections or recommendations regarding continuation of this medication? I appreciate your input! -Ashlyn

## 2017-11-29 ENCOUNTER — Telehealth: Payer: Self-pay | Admitting: Radiation Oncology

## 2017-11-29 NOTE — Telephone Encounter (Signed)
-----   Message from Freeman Caldron, [place] sent at 11/27/2017  8:55 AM EST ----- Regarding: Follow up with patient Sam, Please reach out to Ms. Dawkins and let her know that Dr. Mickeal Skinner agrees that she should continue taking the Tegratol twice daily as she has been doing.  She has refills that I sent at the time of her recent follow up visit.  We will repeat imaging in 1 year, as discussed and she will follow up with Dr. Mickeal Skinner thereafter and going forward. Advise to call us if she needs anything in the interim.  Thanks! -Ashlyn ----- Message ----- From: Ventura Sellers, MD Sent: 11/26/2017   8:56 AM EST To: Freeman Caldron, PA-C  Yes, definitely continue therapy.  Thedore Mins  ----- Message ----- From: Freeman Caldron, PA-C Sent: 11/21/2017   5:37 PM EST To: Ventura Sellers, MD  Just FYI since this patient will follow up with you going forward for management of acoustic schwannoma.  She is now almost 3 years out from treatment and continues taking Tegretol for facial spasms- started approximately 3 months post treatment and has continued.  She does report having mild facial spasms if she misses a dose so my inclination was to continue her on the Tegretol.  Do you have any objections or recommendations regarding continuation of this medication? I appreciate your input! -Ashlyn

## 2017-11-29 NOTE — Progress Notes (Signed)
Phoned patient's cell and home a second time attempting to reach her about Tegratol and follow up. No answer again. Left detailed message the time on her cell that she should continue the Tegratol twice daily as she has been doing per Dr. Mickeal Skinner and Ailene Ards. Also, explained we will repeat imaging in 1 year and she will follow up with Dr. Mickeal Skinner thereafter and moving forward. Left my contact information and encouraged her to call back with future questions.

## 2018-02-25 ENCOUNTER — Ambulatory Visit (INDEPENDENT_AMBULATORY_CARE_PROVIDER_SITE_OTHER): Payer: Commercial Managed Care - PPO

## 2018-02-25 DIAGNOSIS — Z111 Encounter for screening for respiratory tuberculosis: Secondary | ICD-10-CM

## 2018-02-25 MED ORDER — TUBERCULIN PPD 5 UNIT/0.1ML ID SOLN: 5.0000 [IU] | Freq: Once | INTRADERMAL | Status: DC

## 2018-02-27 ENCOUNTER — Ambulatory Visit
Admission: RE | Admit: 2018-02-27 | Discharge: 2018-02-27 | Disposition: A | Discharge status: Home / Self Care | Payer: Commercial Managed Care - PPO | Source: Ambulatory Visit | Attending: Internal Medicine | Admitting: Internal Medicine

## 2018-02-27 ENCOUNTER — Ambulatory Visit
Admission: RE | Admit: 2018-02-27 | Discharge: 2018-02-27 | Disposition: A | Discharge status: Home / Self Care | Payer: Commercial Managed Care - PPO | Attending: Internal Medicine | Admitting: Internal Medicine

## 2018-02-27 ENCOUNTER — Other Ambulatory Visit: Payer: Self-pay

## 2018-02-27 DIAGNOSIS — R7611 Nonspecific reaction to tuberculin skin test without active tuberculosis: Secondary | ICD-10-CM | POA: Insufficient documentation

## 2018-02-27 DIAGNOSIS — Z23 Encounter for immunization: Secondary | ICD-10-CM

## 2018-02-27 LAB — TB SKIN TEST
Induration: 15 mm
TB Skin Test: POSITIVE

## 2018-02-27 MED ORDER — HEPATITIS B VAC RECOMBINANT 10 MCG/ML IJ SUSP
1.0000 mL | Freq: Once | INTRAMUSCULAR | Status: AC
  Administered 2018-02-27: 10 ug via INTRAMUSCULAR

## 2018-02-28 ENCOUNTER — Other Ambulatory Visit: Payer: Self-pay

## 2018-02-28 ENCOUNTER — Telehealth: Payer: Self-pay

## 2018-02-28 NOTE — Telephone Encounter (Signed)
Patient came into office today to receive information for nursing school about her TB reading. Printed off the Chest XR results that noted she had a postive TB Skin Reading but Chest XR was negative.  Signed the bottom and told her to have them call our office if needed. Awaiting callback from [facility] Department about next steps.

## 2018-02-28 NOTE — Telephone Encounter (Signed)
ERROR

## 2018-02-28 NOTE — Telephone Encounter (Signed)
Called and left a VM for the CD nurse at the health department regarding patients Pos TB Skin Test and Neg Chest Xray. Told her on VM patient may have had BCG from her country of origin which is the [place]. Asked her to call office back ASAP so we know what next steps are.   Awaiting call.

## 2018-03-01 DIAGNOSIS — Z23 Encounter for immunization: Secondary | ICD-10-CM | POA: Insufficient documentation

## 2018-03-29 ENCOUNTER — Other Ambulatory Visit: Payer: Self-pay

## 2018-03-29 ENCOUNTER — Ambulatory Visit (INDEPENDENT_AMBULATORY_CARE_PROVIDER_SITE_OTHER): Payer: Commercial Managed Care - PPO

## 2018-03-29 DIAGNOSIS — Z23 Encounter for immunization: Secondary | ICD-10-CM

## 2018-06-04 ENCOUNTER — Other Ambulatory Visit: Payer: Self-pay | Admitting: Radiation Therapy

## 2018-06-04 DIAGNOSIS — D333 Benign neoplasm of cranial nerves: Secondary | ICD-10-CM

## 2018-06-05 ENCOUNTER — Encounter: Payer: Self-pay | Admitting: Radiation Therapy

## 2018-06-05 NOTE — Progress Notes (Signed)
Unable to reach pt by phone or leave a voicemail because her box was full, so I sent out a letter letting her know about the November Brain MRI and visit with Dr. Mickeal Skinner. I included my contact information in case she has a question or a conflict with the dates/times given.   Letter sent out 06/05/18.  Mont Dutton R.T.(R)(T) Special Procedures Navigator

## 2018-06-13 ENCOUNTER — Emergency Department
Admission: EM | Admit: 2018-06-13 | Discharge: 2018-06-13 | Disposition: A | Discharge status: Home / Self Care | Payer: Commercial Managed Care - PPO | Attending: Emergency Medicine | Admitting: Emergency Medicine

## 2018-06-13 ENCOUNTER — Other Ambulatory Visit: Payer: Self-pay

## 2018-06-13 ENCOUNTER — Emergency Department: Payer: Commercial Managed Care - PPO

## 2018-06-13 ENCOUNTER — Encounter: Payer: Self-pay | Admitting: Emergency Medicine

## 2018-06-13 DIAGNOSIS — Y990 Civilian activity done for income or pay: Secondary | ICD-10-CM | POA: Diagnosis not present

## 2018-06-13 DIAGNOSIS — Y9259 Other trade areas as the place of occurrence of the external cause: Secondary | ICD-10-CM | POA: Diagnosis not present

## 2018-06-13 DIAGNOSIS — Z79899 Other long term (current) drug therapy: Secondary | ICD-10-CM | POA: Insufficient documentation

## 2018-06-13 DIAGNOSIS — S8991XA Unspecified injury of right lower leg, initial encounter: Secondary | ICD-10-CM | POA: Diagnosis present

## 2018-06-13 DIAGNOSIS — X509XXA Other and unspecified overexertion or strenuous movements or postures, initial encounter: Secondary | ICD-10-CM | POA: Diagnosis not present

## 2018-06-13 DIAGNOSIS — M25461 Effusion, right knee: Secondary | ICD-10-CM | POA: Diagnosis not present

## 2018-06-13 DIAGNOSIS — Y9301 Activity, walking, marching and hiking: Secondary | ICD-10-CM | POA: Insufficient documentation

## 2018-06-13 MED ORDER — MELOXICAM 15 MG PO TABS: 15.0000 mg | ORAL_TABLET | Freq: Every day | ORAL | 0 refills | Status: DC

## 2018-06-13 MED ORDER — HYDROCODONE-ACETAMINOPHEN 5-325 MG PO TABS
1.0000 | ORAL_TABLET | Freq: Once | ORAL | Status: AC
  Administered 2018-06-13: 08:00:00 1 via ORAL
  Filled 2018-06-13: qty 1

## 2018-06-13 MED ORDER — HYDROCODONE-ACETAMINOPHEN 5-325 MG PO TABS
1.0000 | ORAL_TABLET | Freq: Once | ORAL | Status: DC
  Filled 2018-06-13: qty 1

## 2018-06-13 MED ORDER — HYDROCODONE-ACETAMINOPHEN 5-325 MG PO TABS: 1.0000 | ORAL_TABLET | ORAL | 0 refills | Status: DC | PRN

## 2018-06-13 NOTE — ED Notes (Signed)
See triage note  Presents with pain to lateral aspect of right knee  Denies any fall  But states she felt a "pop"/"tear" to knee while walking up stairs

## 2018-06-13 NOTE — ED Provider Notes (Signed)
[facility] Emergency Department Provider Note ____________________________________________  Time seen: Approximately 8:07 AM  I have reviewed the triage vital signs and the nursing notes.   HISTORY  Chief Complaint Knee Pain    HPI Theresa Lester is a 48 y.o. female who presents to the emergency department for evaluation and treatment of right knee pain. While walking up some steps at work about an hour prior to arrival, she felt a sudden pop/tearing sensation in her right knee. She did not fall. Knee has become swollen and extremely painful. No previous knee injury. No alleviating measures prior to arrival.  Past Medical History:  Diagnosis Date  . Acoustic neuroma ([place])   . Calculus of gallbladder   . GERD (gastroesophageal reflux disease)   . Hemorrhoids, internal   . Restless leg     Patient Active Problem List   Diagnosis Date Noted  . Immunization, BCG 03/01/2018  . Plantar fasciitis of right foot 10/11/2016  . Tricompartment osteoarthritis of left knee 10/11/2016  . Left axillary hidradenitis 11/05/2015  . Hemifacial spasm   . TIA (transient ischemic attack) 06/11/2015  . Episodic tension-type headache, not intractable 04/14/2015  . Unilateral vestibular schwannoma ([place]) 01/12/2015  . Tinnitus 11/25/2014  . Mixed migraine and muscle contraction headache 10/20/2014  . Angioneurotic edema 10/19/2014  . Arthralgia of hip or thigh 10/19/2014  . Calculus of gallbladder 10/19/2014  . Acid reflux 10/19/2014  . Blood glucose elevated 10/19/2014  . Hemorrhoids, internal 10/19/2014  . Restless leg 10/19/2014  . Fast heart beat 10/19/2014    Past Surgical History:  Procedure Laterality Date  . BILATERAL CARPAL TUNNEL RELEASE    . KNEE ARTHROSCOPY WITH MEDIAL MENISECTOMY Left 01/18/2017   Procedure: KNEE ARTHROSCOPY WITH MEDIAL MENISECTOMY ROOT REPAIR CHONDROPLASTY;  Surgeon: Leim Fabry, MD;  Location: [facility];  Service:  Orthopedics;  Laterality: Left;  SUPINE WITH ACL LEG HOLDER ARTHREX ALEX  FLIP CUTTER, MENISCUS ROOT GUIDE STRYKER [place] CETERIX AIR  ANESTHESIA: ADDUCTOR CANAL NERVE BLOCK  . TUBAL LIGATION    . VESTIBULAR NERVE SECTION Right 2017    Prior to Admission medications   Medication Sig Start Date End Date Taking? Authorizing Provider  carbamazepine (TEGRETOL XR) 100 MG 12 hr tablet Take 1 tablet (100 mg total) by mouth 3 (three) times daily. 11/21/17   Bruning, Ashlyn, PA-C  HYDROcodone-acetaminophen (NORCO/VICODIN) 5-325 MG tablet Take 1 tablet by mouth every 4 (four) hours as needed for moderate pain. 06/13/18 06/13/19  Adina Puzzo, Dessa Phi, FNP  LORazepam (ATIVAN) 0.5 MG tablet Take 1 tablet (0.5 mg total) by mouth as needed for anxiety (30 minutes before MRI scans. May repeat once if needed just prior to scan.). 11/06/17   Bruning, Ashlyn, PA-C  meloxicam (MOBIC) 15 MG tablet Take 1 tablet (15 mg total) by mouth daily. 06/13/18   Victorino Dike, FNP    Allergies Patient has no known allergies.  Family History  Problem Relation Age of Onset  . Hypertension Mother   . Diabetes Mother   . Breast cancer Neg Hx     Social History Social History   Tobacco Use  . Smoking status: Never Smoker  . Smokeless tobacco: Never Used  Substance Use Topics  . Alcohol use: No    Alcohol/week: 0.0 standard drinks  . Drug use: No    Review of Systems Constitutional: Negative for fever. Cardiovascular: Negative for chest pain. Respiratory: Negative for shortness of breath. Musculoskeletal: Positive for right knee pain. Skin: Positive for right  knee swelling.  Neurological: Negative for decrease in sensation  ____________________________________________   PHYSICAL EXAM:  VITAL SIGNS: ED Triage Vitals  Enc Vitals Group     BP 06/13/18 0657 (!) 152/80     Pulse Rate 06/13/18 0657 86     Resp 06/13/18 0657 18     Temp 06/13/18 0657 98.3 F (36.8 C)     Temp Source 06/13/18 0657 Oral      SpO2 06/13/18 0657 98 %     Weight 06/13/18 0656 184 lb (83.5 kg)     Height 06/13/18 0656 5\' 3"  (1.6 m)     Head Circumference --      Peak Flow --      Pain Score 06/13/18 0656 10     Pain Loc --      Pain Edu? --      Excl. in [place]? --     Constitutional: Alert and oriented. Well appearing and in no acute distress. Eyes: Conjunctivae are clear without discharge or drainage Head: Atraumatic Neck: Supple Respiratory: No cough. Respirations are even and unlabored. Musculoskeletal: Diffuse edema over the right knee.  Obvious joint effusion on exam.  Patient unable to tolerate diagnostic maneuvers. Neurologic: Motor and sensory function intact throughout Skin: Mild edema over the right knee without open wound or lesions. Psychiatric: Affect and behavior are appropriate.  ____________________________________________   LABS (all labs ordered are listed, but only abnormal results are displayed)  Labs Reviewed - No data to display ____________________________________________  RADIOLOGY  Tricompartmental arthritis in the right knee without acute findings ____________________________________________   PROCEDURES  .Ortho Injury Treatment Date/Time: 06/13/2018 9:44 AM Performed by: Victorino Dike, FNP Authorized by: Victorino Dike, FNP   Consent:    Consent obtained:  Verbal   Consent given by:  Patient   Risks discussed:  Restricted joint movementInjury location: knee Location details: right knee Pre-procedure neurovascular assessment: neurovascularly intact Splint type: Knee immobilizer. Post-procedure neurovascular assessment: post-procedure neurovascularly intact     ____________________________________________   INITIAL IMPRESSION / ASSESSMENT AND PLAN / ED COURSE  Theresa Lester is a 48 y.o. who presents to the emergency department for treatment and evaluation of right knee pain that occurred approximately 1 hour prior to arrival.  X-ray is  unremarkable for acute abnormalities.  She was placed in a knee immobilizer and will be provided pain management for the next week or so.  She was encouraged to call and schedule follow-up appointment with her orthopedic doctor, Dr. Posey Pronto.  She was instructed to return to the emergency department for symptoms of change or worsen if unable to schedule an appointment.   Medications  HYDROcodone-acetaminophen (NORCO/VICODIN) 5-325 MG per tablet 1 tablet (1 tablet Oral Not Given 06/13/18 0759)  HYDROcodone-acetaminophen (NORCO/VICODIN) 5-325 MG per tablet 1 tablet (1 tablet Oral Given 06/13/18 0803)    Pertinent labs & imaging results that were available during my care of the patient were reviewed by me and considered in my medical decision making (see chart for details).  _________________________________________   FINAL CLINICAL IMPRESSION(S) / ED DIAGNOSES  Final diagnoses:  Effusion of right knee  Knee injury, right, initial encounter    ED Discharge Orders         Ordered    HYDROcodone-acetaminophen (NORCO/VICODIN) 5-325 MG tablet  Every 4 hours PRN     06/13/18 0802    meloxicam (MOBIC) 15 MG tablet  Daily     06/13/18 0802  If controlled substance prescribed during this visit, 12 month history viewed on the [place] prior to issuing an initial prescription for Schedule II or III opiod.   Victorino Dike, FNP 06/13/18 4709    Carrie Mew, MD 06/16/18 Dyann Kief

## 2018-06-13 NOTE — Discharge Instructions (Signed)
Please call and schedule an appointment with Dr. Posey Pronto.  Rest, ice, and elevate your lower extremity as often as possible over the next several days.  Return to the ER for symptoms that change or worsen or for new concerns.

## 2018-06-13 NOTE — ED Triage Notes (Signed)
Pt to triage via w/c with no distress noted, mask in place; pt reports injuring right knee going up steps this morning (employed with [employer]--drug screening only on request per profile))

## 2018-07-01 ENCOUNTER — Other Ambulatory Visit: Payer: Self-pay | Admitting: Orthopedic Surgery

## 2018-07-01 DIAGNOSIS — M25561 Pain in right knee: Secondary | ICD-10-CM

## 2018-07-03 ENCOUNTER — Other Ambulatory Visit: Payer: Self-pay

## 2018-07-03 ENCOUNTER — Ambulatory Visit
Admission: RE | Admit: 2018-07-03 | Discharge: 2018-07-03 | Disposition: A | Discharge status: Home / Self Care | Payer: Commercial Managed Care - PPO | Source: Ambulatory Visit | Attending: Orthopedic Surgery | Admitting: Orthopedic Surgery

## 2018-07-03 DIAGNOSIS — M25561 Pain in right knee: Secondary | ICD-10-CM | POA: Insufficient documentation

## 2018-07-09 ENCOUNTER — Telehealth: Payer: Self-pay | Admitting: *Deleted

## 2018-07-09 NOTE — Telephone Encounter (Signed)
On 07-08-18 fax medical records to [facility]

## 2018-07-10 ENCOUNTER — Other Ambulatory Visit: Payer: Self-pay

## 2018-07-10 ENCOUNTER — Encounter: Payer: Self-pay | Admitting: *Deleted

## 2018-07-12 ENCOUNTER — Other Ambulatory Visit
Admission: RE | Admit: 2018-07-12 | Discharge: 2018-07-12 | Disposition: A | Discharge status: Home / Self Care | Payer: Commercial Managed Care - PPO | Source: Ambulatory Visit | Attending: Orthopedic Surgery | Admitting: Orthopedic Surgery

## 2018-07-12 ENCOUNTER — Other Ambulatory Visit: Payer: Self-pay

## 2018-07-12 DIAGNOSIS — Z1159 Encounter for screening for other viral diseases: Secondary | ICD-10-CM | POA: Diagnosis present

## 2018-07-13 LAB — NOVEL CORONAVIRUS, NAA (HOSP ORDER, SEND-OUT TO REF LAB; TAT 18-24 HRS): SARS-CoV-2, NAA: NOT DETECTED

## 2018-07-16 ENCOUNTER — Ambulatory Visit: Payer: Commercial Managed Care - PPO | Admitting: Anesthesiology

## 2018-07-16 ENCOUNTER — Other Ambulatory Visit: Payer: Self-pay

## 2018-07-16 ENCOUNTER — Encounter
Admission: RE | Disposition: A | Discharge status: Home / Self Care | Payer: Self-pay | Source: Home / Self Care | Attending: Orthopedic Surgery

## 2018-07-16 ENCOUNTER — Ambulatory Visit
Admission: RE | Admit: 2018-07-16 | Discharge: 2018-07-16 | Disposition: A | Discharge status: Home / Self Care | Payer: Commercial Managed Care - PPO | Attending: Orthopedic Surgery | Admitting: Orthopedic Surgery

## 2018-07-16 DIAGNOSIS — K219 Gastro-esophageal reflux disease without esophagitis: Secondary | ICD-10-CM | POA: Diagnosis not present

## 2018-07-16 DIAGNOSIS — M659 Synovitis and tenosynovitis, unspecified: Secondary | ICD-10-CM | POA: Diagnosis not present

## 2018-07-16 DIAGNOSIS — Z79899 Other long term (current) drug therapy: Secondary | ICD-10-CM | POA: Insufficient documentation

## 2018-07-16 DIAGNOSIS — M1711 Unilateral primary osteoarthritis, right knee: Secondary | ICD-10-CM | POA: Insufficient documentation

## 2018-07-16 DIAGNOSIS — M23203 Derangement of unspecified medial meniscus due to old tear or injury, right knee: Secondary | ICD-10-CM | POA: Diagnosis present

## 2018-07-16 DIAGNOSIS — M23221 Derangement of posterior horn of medial meniscus due to old tear or injury, right knee: Secondary | ICD-10-CM | POA: Diagnosis not present

## 2018-07-16 HISTORY — PX: KNEE ARTHROSCOPY WITH MEDIAL MENISECTOMY: SHX5651

## 2018-07-16 LAB — POCT PREGNANCY, URINE: Preg Test, Ur: NEGATIVE

## 2018-07-16 SURGERY — ARTHROSCOPY, KNEE, WITH MEDIAL MENISCECTOMY
Anesthesia: Regional | Site: Knee | Laterality: Right

## 2018-07-16 MED ORDER — PROPOFOL 10 MG/ML IV BOLUS
INTRAVENOUS | Status: DC | PRN
  Administered 2018-07-16: 200 mg via INTRAVENOUS

## 2018-07-16 MED ORDER — DEXAMETHASONE SODIUM PHOSPHATE 4 MG/ML IJ SOLN
INTRAMUSCULAR | Status: DC | PRN
  Administered 2018-07-16: 8 mg via INTRAVENOUS

## 2018-07-16 MED ORDER — OXYCODONE HCL 5 MG PO TABS
5.0000 mg | ORAL_TABLET | Freq: Once | ORAL | Status: AC | PRN
  Administered 2018-07-16: 10 mg via ORAL

## 2018-07-16 MED ORDER — ACETAMINOPHEN 10 MG/ML IV SOLN
1000.0000 mg | Freq: Once | INTRAVENOUS | Status: AC
  Administered 2018-07-16: 1000 mg via INTRAVENOUS

## 2018-07-16 MED ORDER — ROPIVACAINE HCL 5 MG/ML IJ SOLN
INTRAMUSCULAR | Status: DC | PRN
  Administered 2018-07-16: 30 mL via PERINEURAL

## 2018-07-16 MED ORDER — LACTATED RINGERS IV SOLN
INTRAVENOUS | Status: DC
  Administered 2018-07-16: 12:00:00 via INTRAVENOUS

## 2018-07-16 MED ORDER — ASPIRIN EC 325 MG PO TBEC: 325.0000 mg | DELAYED_RELEASE_TABLET | Freq: Every day | ORAL | 0 refills | Status: AC

## 2018-07-16 MED ORDER — FENTANYL CITRATE (PF) 100 MCG/2ML IJ SOLN: 25.0000 ug | INTRAMUSCULAR | Status: DC | PRN

## 2018-07-16 MED ORDER — FENTANYL CITRATE (PF) 100 MCG/2ML IJ SOLN
INTRAMUSCULAR | Status: DC | PRN
  Administered 2018-07-16: 100 ug via INTRAVENOUS
  Administered 2018-07-16 (×2): 25 ug via INTRAVENOUS

## 2018-07-16 MED ORDER — OXYCODONE HCL 5 MG PO TABS: 5.0000 mg | ORAL_TABLET | ORAL | 0 refills | Status: DC | PRN

## 2018-07-16 MED ORDER — ACETAMINOPHEN 160 MG/5ML PO SOLN: 325.0000 mg | ORAL | Status: DC | PRN

## 2018-07-16 MED ORDER — ACETAMINOPHEN 325 MG PO TABS: 325.0000 mg | ORAL_TABLET | ORAL | Status: DC | PRN

## 2018-07-16 MED ORDER — OXYCODONE HCL 5 MG/5ML PO SOLN: 5.0000 mg | Freq: Once | ORAL | Status: AC | PRN

## 2018-07-16 MED ORDER — ONDANSETRON 4 MG PO TBDP: 4.0000 mg | ORAL_TABLET | Freq: Three times a day (TID) | ORAL | 0 refills | Status: DC | PRN

## 2018-07-16 MED ORDER — ACETAMINOPHEN 500 MG PO TABS: 1000.0000 mg | ORAL_TABLET | Freq: Three times a day (TID) | ORAL | 2 refills | Status: DC

## 2018-07-16 MED ORDER — CEFAZOLIN SODIUM-DEXTROSE 2-4 GM/100ML-% IV SOLN
2.0000 g | Freq: Once | INTRAVENOUS | Status: AC
  Administered 2018-07-16: 13:00:00 2 g via INTRAVENOUS

## 2018-07-16 MED ORDER — IBUPROFEN 800 MG PO TABS: 800.0000 mg | ORAL_TABLET | Freq: Three times a day (TID) | ORAL | 0 refills | Status: AC

## 2018-07-16 MED ORDER — SCOPOLAMINE 1 MG/3DAYS TD PT72
1.0000 | MEDICATED_PATCH | Freq: Once | TRANSDERMAL | Status: DC
  Administered 2018-07-16: 12:00:00 1.5 mg via TRANSDERMAL

## 2018-07-16 MED ORDER — GLYCOPYRROLATE 0.2 MG/ML IJ SOLN
INTRAMUSCULAR | Status: DC | PRN
  Administered 2018-07-16: .1 mg via INTRAVENOUS

## 2018-07-16 MED ORDER — MIDAZOLAM HCL 5 MG/5ML IJ SOLN
INTRAMUSCULAR | Status: DC | PRN
  Administered 2018-07-16: 1 mg via INTRAVENOUS
  Administered 2018-07-16: 2 mg via INTRAVENOUS

## 2018-07-16 MED ORDER — ONDANSETRON HCL 4 MG/2ML IJ SOLN
INTRAMUSCULAR | Status: DC | PRN
  Administered 2018-07-16: 4 mg via INTRAVENOUS

## 2018-07-16 MED ORDER — LIDOCAINE-EPINEPHRINE 1 %-1:100000 IJ SOLN
INTRAMUSCULAR | Status: DC | PRN
  Administered 2018-07-16: 2.5 mL

## 2018-07-16 MED ORDER — LIDOCAINE HCL (CARDIAC) PF 100 MG/5ML IV SOSY
PREFILLED_SYRINGE | INTRAVENOUS | Status: DC | PRN
  Administered 2018-07-16: 40 mg via INTRATRACHEAL

## 2018-07-16 MED ORDER — BUPIVACAINE HCL (PF) 0.5 % IJ SOLN
INTRAMUSCULAR | Status: DC | PRN
  Administered 2018-07-16: 2.5 mL

## 2018-07-16 SURGICAL SUPPLY — 51 items
ADAPTER IRRIG TUBE 2 SPIKE SOL (ADAPTER) ×4 IMPLANT
BLADE SURG SZ11 CARB STEEL (BLADE) ×2 IMPLANT
BNDG ESMARK 6X12 TAN STRL LF (GAUZE/BANDAGES/DRESSINGS) ×2 IMPLANT
BUR RADIUS 3.5 (BURR) ×1 IMPLANT
CHLORAPREP W/TINT 26 (MISCELLANEOUS) ×2 IMPLANT
COOLER POLAR GLACIER W/PUMP (MISCELLANEOUS) ×2 IMPLANT
COVER LIGHT HANDLE UNIVERSAL (MISCELLANEOUS) ×4 IMPLANT
CUFF TOURN SGL QUICK 30 (TOURNIQUET CUFF) ×1
CUFF TRNQT CYL 30X4X21-28X (TOURNIQUET CUFF) IMPLANT
DERMABOND ADVANCED (GAUZE/BANDAGES/DRESSINGS) ×1
DERMABOND ADVANCED .7 DNX12 (GAUZE/BANDAGES/DRESSINGS) IMPLANT
DRAPE IMP U-DRAPE 54X76 (DRAPES) ×2 IMPLANT
GAUZE SPONGE 4X4 12PLY STRL (GAUZE/BANDAGES/DRESSINGS) ×2 IMPLANT
GLOVE BIO SURGEON STRL SZ7.5 (GLOVE) ×2 IMPLANT
GLOVE BIOGEL PI IND STRL 8 (GLOVE) ×1 IMPLANT
GLOVE BIOGEL PI INDICATOR 8 (GLOVE) ×1
GOWN STRL REIN 2XL XLG LVL4 (GOWN DISPOSABLE) ×2 IMPLANT
GOWN STRL REUS W/ TWL LRG LVL3 (GOWN DISPOSABLE) ×1 IMPLANT
GOWN STRL REUS W/TWL LRG LVL3 (GOWN DISPOSABLE) ×1
IV LACTATED RINGER IRRG 3000ML (IV SOLUTION) ×8
IV LR IRRIG 3000ML ARTHROMATIC (IV SOLUTION) ×4 IMPLANT
Implant System, Meniscal Root Repair ×1 IMPLANT
KIT BIO-TENODESIS 3X8 DISP (MISCELLANEOUS) ×1
KIT INSRT BABSR STRL DISP BTN (MISCELLANEOUS) IMPLANT
KIT TURNOVER KIT A (KITS) ×2 IMPLANT
MAT ABSORB  FLUID 56X50 GRAY (MISCELLANEOUS) ×2
MAT ABSORB FLUID 56X50 GRAY (MISCELLANEOUS) ×1 IMPLANT
Mayo Catgut 1/2 Circle Taper Pt ×1 IMPLANT
Meniscal Root Instrument Loaner ×1 IMPLANT
NEPTUNE MANIFOLD (MISCELLANEOUS) ×2 IMPLANT
PACK ARTHROSCOPY KNEE (MISCELLANEOUS) ×2 IMPLANT
PAD WRAPON POLAR KNEE (MISCELLANEOUS) ×1 IMPLANT
PADDING CAST BLEND 6X4 STRL (MISCELLANEOUS) ×1 IMPLANT
PADDING STRL CAST 6IN (MISCELLANEOUS) ×1
SET TUBE SUCT SHAVER OUTFL 24K (TUBING) ×2 IMPLANT
SET TUBE TIP INTRA-ARTICULAR (MISCELLANEOUS) ×2 IMPLANT
SUT ETHILON 3-0 FS-10 30 BLK (SUTURE) ×2
SUT ETHILON 4-0 (SUTURE) ×1
SUT ETHILON 4-0 FS2 18XMFL BLK (SUTURE) ×1
SUT MNCRL 4-0 (SUTURE) ×1
SUT MNCRL 4-0 27XMFL (SUTURE) ×1
SUT VIC AB 2-0 CT1 27 (SUTURE) ×1
SUT VIC AB 2-0 CT1 TAPERPNT 27 (SUTURE) IMPLANT
SUTURE EHLN 3-0 FS-10 30 BLK (SUTURE) ×1 IMPLANT
SUTURE ETHLN 4-0 FS2 18XMF BLK (SUTURE) IMPLANT
SUTURE MNCRL 4-0 27XMF (SUTURE) IMPLANT
SYSTEM IMPL ACL/PCL SWIVILLOCK (Anchor) ×1 IMPLANT
TOWEL OR 17X26 4PK STRL BLUE (TOWEL DISPOSABLE) ×6 IMPLANT
TUBING ARTHRO INFLOW-ONLY STRL (TUBING) ×2 IMPLANT
WAND WEREWOLF FLOW 90D (MISCELLANEOUS) ×1 IMPLANT
WRAPON POLAR PAD KNEE (MISCELLANEOUS) ×2

## 2018-07-16 NOTE — H&P (Signed)
Paper H&P to be scanned into permanent record. H&P reviewed. No significant changes noted.  

## 2018-07-16 NOTE — Anesthesia Preprocedure Evaluation (Signed)
Anesthesia Evaluation  Patient identified by MRN, date of birth, ID band  Reviewed: Allergy & Precautions, NPO status , Patient's Chart, lab work & pertinent test results  History of Anesthesia Complications Negative for: history of anesthetic complications  Airway Mallampati: II  TM Distance: >3 FB Neck ROM: full    Dental no notable dental hx.    Pulmonary neg pulmonary ROS,    breath sounds clear to auscultation       Cardiovascular negative cardio ROS   Rhythm:Regular Rate:Normal     Neuro/Psych  Headaches, L ear acoustic neuroma > on tegretol  negative psych ROS   GI/Hepatic GERD  Controlled,  Endo/Other  negative endocrine ROS  Renal/GU   negative genitourinary   Musculoskeletal  (+) Arthritis ,   Abdominal   Peds  Hematology   Anesthesia Other Findings   Reproductive/Obstetrics                             Anesthesia Physical  Anesthesia Plan  ASA: II  Anesthesia Plan: General and Regional   Post-op Pain Management:  Regional for Post-op pain   Induction:   PONV Risk Score and Plan: Ondansetron, Dexamethasone and Scopolamine patch - Pre-op  Airway Management Planned: LMA  Additional Equipment:   Intra-op Plan:   Post-operative Plan:   Informed Consent: I have reviewed the patients History and Physical, chart, labs and discussed the procedure including the risks, benefits and alternatives for the proposed anesthesia with the patient or authorized representative who has indicated his/her understanding and acceptance.       Plan Discussed with: CRNA  Anesthesia Plan Comments: (Adductor Canal block.)        Anesthesia Quick Evaluation

## 2018-07-16 NOTE — Op Note (Signed)
DATE: 07/16/2018   PRE-OP DIAGNOSIS:  1. Right medial meniscus root tear 2. Right Medial compartment and patellofemoral compartment osteoarthritis   POST-OP DIAGNOSIS:  1. Right medial meniscus root tear 2. Right Medial compartment and patellofemoral compartment osteoarthritis 3. Right knee patellofemoral synovitis  PROCEDURES:  1. Right knee medial meniscus root repair  2. Right medial femoral condyle and patellofemoral chondroplasty  3. Right knee patellofemoral synovectomy    SURGEON:  Langston Reusing, MD   ASSISTANT(S):  None   ANESTHESIA: regional adductor canal block + general   TOTAL IV FLUIDS: See anesthesia record   ESTIMATED BLOOD LOSS: 10cc   TOURNIQUET TIME:  73 minutes   DRAINS:  None.   SPECIMENS: None   IMPLANTS:  - Arthrex Biocomposite SwiveLock (x1)     COMPLICATIONS: None   INDICATIONS: Theresa Lester is a 48 y.o. female with R knee pain that began after feeling a pop while going up the stairs on 06/13/2018.  Clinical exam and radiographic studies were notable for right medial meniscus root tear with mild degenerative changes to medial and patellofemoral compartments. Given the poor long-term prognosis of a meniscus root tear and high likelihood of significant progression of osteoarthritis, we elected to proceed with the above procedure after a discussion of the risks, benefits, and alternatives to surgery.    OPERATIVE FINDINGS:    Examination under anesthesia: A careful examination under anesthesia was performed.  Passive range of motion was: Hyperextension: 1.  Extension: 0.  Flexion: 130.  Lachman: normal. Pivot Shift: normal.  Posterior drawer: normal.  Varus stability in full extension: normal.  Varus stability in 30 degrees of flexion: normal.  Valgus stability in full extension: normal.  Valgus stability in 30 degrees of flexion: normal.   Intra-operative findings: A thorough arthroscopic examination of the knee was performed.  The  findings are: 1. Suprapatellar pouch: Severe synovitis 2. Undersurface of median ridge: Grade 1-2 degenerative changes 3. Medial patellar facet: Grade 1-2 degenerative changes 4. Lateral patellar facet: Grade 1-2 degenerative changes 5. Trochlea: Grade 3-4 degenerative changes over central aspect of trochlear groove 6. Lateral gutter/popliteus tendon: Normal 7. Hoffa's fat pad: Inflamed with significant synovitis 8. Medial gutter/plica: Normal 9. ACL: Normal 10. PCL: Normal 11. Medial meniscus: Complete tear of the medial meniscus at the posterior root 12. Medial compartment cartilage: areas of Grade 3-4 degenerative changes on MFC, grade 1-2 changes on tibial plateau 13. Lateral meniscus: Normal 14. Lateral compartment cartilage: Grade 1-2 changes to tibial plateau  DESCRIPTION OF PROCEDURE: I identified Theresa Lester in the pre-operative holding area.  I marked the operative knee with my initials. I reviewed the risks and benefits of the proposed surgical intervention and the patient (and/or patient's guardian) wished to proceed. The patient was transferred to the operative suite and placed in the supine position with all bony prominences padded.  Anesthesia was administered. Appropriate IV antibiotics were administered prior to incision. The extremity was then prepped and draped in standard fashion. A time out was performed confirming the correct extremity, correct patient, and correct procedure.   Arthroscopy portals were marked. Local anesthetic was injected to the planned portal sites. The anterolateral portal was established with an 11 blade. The arthroscope was placed in the anterolateral portal and then into the suprapatellar pouch.  A diagnostic knee scope was completed with the above findings. Next the medial portal was established under needle localization. The MCL was pie-crusted to improve visualization of the posterior horn. The meniscus root tear was  identified and probed  to confirm our findings. Next, an Arthrex meniscus root aiming guide was used to mark out the tibial incision. An approximately 4cm vertical incision was made medial to the tibial tubercle. This was carried down to the sartorius fascia with bovie electrocautery, and the fascia was incised. An elevator was used to clear periosteum from the tibia in the area of the anticipated bone tunnel. Hemostasis was achieved. The guide was reinserted into the tibia and was placed over the anatomic footprint of the medial meniscus root by hooking the posterior tibial cortex and setting the guide to 7.33mm off the posterior cortex. We then used a 6.41mm FlipCutter to drill into the tibia and create a 78mm socket for the meniscus root. A FiberStick was passed through our tibial tunnel and into the joint. This was retrieved and passed through the anterolateral portal.   Next, we passed an 0-FiberLink suture through the midportion of the meniscus just medial to the detached root.  This was performed using a meniscal scorpion.  A luggage tag stitch was created and passed into the joint, which showed excellent purchase of the meniscus root. This stitch and configuration was repeated ~37mm medial to the first stitch. We ensured there was no soft tissue bridge between the sutures and pulled the passing stitch from the [place] through the anteromedial portal. We then used the passing stitch to bring the sutures in the meniscus out through the tibial tunnel. These were then passed through an [employer] anchor. Anchor hole was drilled ~3cm distal previously drilled tibial tunnel. Anchor was inserted with appropriate tension while visualizing the repair with the arthroscope, and this achieved excellent interference fit. The meniscus root was probed and found to be stable.   Chondroplasty was performed of the medial femoral condyle and patellofemoral joint using an oscillating shaver such that there were stable edges of cartilage.   Additionally using an ArthroCare wand and oscillating shaver, a synovectomy was performed of the patellofemoral compartment involving both the suprapatellar pouch and around Hoffa's fat pad.  Any loose bony debris was removed from the knee joint with a shaver and excess fluid was evacuated from the joint. Closure of the portals with 3-0 Nylon was performed. The sartorius fascia was closed 0 vicryl and the Fiberwire from the SwiveLock anchor. The subdermal layer of the tibial incision was closed with 2-0 vicryl and the skin was closed with 4-0 Monocryl in a running fashion and Dermabond.  Xeroform gauze and dry sterile dressings were applied. A PolarCare and hinged knee brace were also applied.   Instrument, sponge, and needle counts were correct prior to wound closure and at the conclusion of the case.   Additionally, this case had increased complexity compared to standard meniscus repair given that the patient had a complete tear of the meniscus root. Repair of this tear involved making a separate open incision, drilling a tunnel through the tibia and using an implant in the tibia for fixation, all of which would otherwise not occur for standard meniscal repairs.  The steps increased surgical time by approximately 30 minutes.  DISPOSITION: PACU - hemodynamically stable.    POSTOPERATIVE PLAN: The patient will be discharged home today.     Non-weight bearing x 4 weeks. 50% WB from weeks 4-6. ASA for DVT ppx x 4 weeks, Narcotic medication, NSAID, and acetaminophen as discussed pre-operatively. The patient will be attending physical therapy beginning 3-4 days post-op. Physical therapy per Meniscus Root Repair Rehab Guidelines.   Patient to  return to clinic 10-14 days postop for suture removal.

## 2018-07-16 NOTE — Anesthesia Procedure Notes (Signed)
Anesthesia Regional Block: Adductor canal block   Pre-Anesthetic Checklist: ,, timeout performed, Correct Patient, Correct Site, Correct Laterality, Correct Procedure, Correct Position, site marked, Risks and benefits discussed,  Surgical consent,  Pre-op evaluation,  At surgeon's request and post-op pain management  Laterality: Right  Prep: chloraprep       Needles:  Injection technique: Single-shot  Needle Type: Stimiplex     Needle Length: 9cm  Needle Gauge: 21     Additional Needles:   Procedures:,,,, ultrasound used (permanent image in chart),,,,  Narrative:  Start time: 07/16/2018 11:58 AM End time: 07/16/2018 12:02 PM Injection made incrementally with aspirations every 5 mL.  Performed by: Personally  Anesthesiologist: Veda Canning, MD

## 2018-07-16 NOTE — Anesthesia Procedure Notes (Signed)
Procedure Name: LMA Insertion Date/Time: 07/16/2018 1:27 PM Performed by: Mayme Genta, CRNA Pre-anesthesia Checklist: Patient identified, Emergency Drugs available, Suction available, Timeout performed and Patient being monitored Patient Re-evaluated:Patient Re-evaluated prior to induction Oxygen Delivery Method: Circle system utilized Preoxygenation: Pre-oxygenation with 100% oxygen Induction Type: IV induction LMA: LMA inserted LMA Size: 4.0 Number of attempts: 1 Placement Confirmation: positive ETCO2 and breath sounds checked- equal and bilateral Tube secured with: Tape

## 2018-07-16 NOTE — Discharge Instructions (Signed)
Arthroscopic Knee Surgery - Meniscus Repair   Post-Op Instructions   1. Bracing or crutches: Crutches will be provided at the time of discharge from the surgery center. Keep brace locked in extension at all times except as directed by physical therapy.    2. Ice: You may be provided with a device [facility]) that allows you to ice the affected area effectively. Otherwise you can ice manually.    3. Driving:  Plan on not driving for at least four weeks. Please note that you are advised NOT to drive while taking narcotic pain medications as you may be impaired and unsafe to drive.   4. Activity: Ankle pumps several times an hour while awake to prevent blood clots. Weight bearing: NO WEIGHT BEARING FOR 4 WEEKS. Use crutches for at least 4 weeks, if not 6 based on your surgery. Bending and straightening the knee is unlimited, but do not flex your knee past 90 degrees until cleared by your therapist. Elevate knee above heart level as much as possible for one week. Avoid standing more than 5 minutes (consecutively) for the first week. No exercise involving the knee until cleared by the surgeon or physical therapist.  Avoid long distance travel for 4 weeks.   5. Medications:  - You have been provided a prescription for narcotic pain medicine. After surgery, take 1-2 narcotic tablets every 4 hours if needed for severe pain. If it has tylenol (acetaminophen), please do not take a total of more than 3000mg /day of tylenol.  - A prescription for anti-nausea medication will be provided in case the narcotic medicine causes nausea - take 1 tablet every 6 hours only if nauseated.  - Take ibuprofen 800 mg every 8 hours with food to reduce post-operative knee swelling. DO NOT STOP IBUPROFEN POST-OP UNTIL INSTRUCTED TO DO SO at first post-op office visit (10-14 days after surgery).  - Take enteric coated aspirin 325 mg once daily for 4 weeks to prevent blood clots.  -Take tylenol 1000 every 8 hours for pain.  May  stop tylenol 3 days after surgery or when you are having minimal pain. If your narcotic has tylenol (acetaminophen), please do not take a total of more than 3000mg /day of tylenol.    If you are taking prescription medication for anxiety, depression, insomnia, muscle spasm, chronic pain, or for attention deficit disorder you are advised that you are at a higher risk of adverse effects with use of narcotics post-op, including narcotic addiction/dependence, depressed breathing, death. If you use non-prescribed substances: alcohol, marijuana, cocaine, heroin, methamphetamines, etc., you are at a higher risk of adverse effects with use of narcotics post-op, including narcotic addiction/dependence, depressed breathing, death. You are advised that taking > 50 morphine milligram equivalents (MME) of narcotic pain medication per day results in twice the risk of overdose or death. For your prescription provided: oxycodone 5 mg - taking more than 6 tablets per day. Be advised that we will prescribe narcotics short-term, for acute post-operative pain only - 1 week for minor operations such as knee arthroscopy for meniscus tear resection, and 3 weeks for major operations such as knee repair/reconstruction surgeries.   6. Bandages: The physical therapist should change the bandages at the first post-op appointment. If needed, the dressing supplies have been provided to you. You may shower after this with waterproof bandaids covering the incisions.    7. Physical Therapy: 2 times per week for the first 4 weeks, then 1-2 times per week from weeks 4-8 post-op. Therapy typically  starts on post operative Day 3 or 4. You have been provided an order for physical therapy. The therapist will provide home exercises.   8. Work: May return to full work when off of crutches. May do light duty/desk job in approximately 1-2 weeks when off of narcotics, pain is well-controlled, and swelling has decreased.   9. Post-Op  Appointments: Your first post-op appointment will be with Dr. Posey Pronto in approximately 2 weeks time.    If you find that they have not been scheduled please call the Orthopaedic Appointment front desk at [phone].      General Anesthesia, Adult, Care After This sheet gives you information about how to care for yourself after your procedure. Your health care provider may also give you more specific instructions. If you have problems or questions, contact your health care provider. What can I expect after the procedure? After the procedure, the following side effects are common:  Pain or discomfort at the IV site.  Nausea.  Vomiting.  Sore throat.  Trouble concentrating.  Feeling cold or chills.  Weak or tired.  Sleepiness and fatigue.  Soreness and body aches. These side effects can affect parts of the body that were not involved in surgery. Follow these instructions at home:  For at least 24 hours after the procedure:  Have a responsible adult stay with you. It is important to have someone help care for you until you are awake and alert.  Rest as needed.  Do not: ? Participate in activities in which you could fall or become injured. ? Drive. ? Use heavy machinery. ? Drink alcohol. ? Take sleeping pills or medicines that cause drowsiness. ? Make important decisions or sign legal documents. ? Take care of children on your own. Eating and drinking  Follow any instructions from your health care provider about eating or drinking restrictions.  When you feel hungry, start by eating small amounts of foods that are soft and easy to digest (bland), such as toast. Gradually return to your regular diet.  Drink enough fluid to keep your urine pale yellow.  If you vomit, rehydrate by drinking water, juice, or clear broth. General instructions  If you have sleep apnea, surgery and certain medicines can increase your risk for breathing problems. Follow instructions from  your health care provider about wearing your sleep device: ? Anytime you are sleeping, including during daytime naps. ? While taking prescription pain medicines, sleeping medicines, or medicines that make you drowsy.  Return to your normal activities as told by your health care provider. Ask your health care provider what activities are safe for you.  Take over-the-counter and prescription medicines only as told by your health care provider.  If you smoke, do not smoke without supervision.  Keep all follow-up visits as told by your health care provider. This is important. Contact a health care provider if:  You have nausea or vomiting that does not get better with medicine.  You cannot eat or drink without vomiting.  You have pain that does not get better with medicine.  You are unable to pass urine.  You develop a skin rash.  You have a fever.  You have redness around your IV site that gets worse. Get help right away if:  You have difficulty breathing.  You have chest pain.  You have blood in your urine or stool, or you vomit blood. Summary  After the procedure, it is common to have a sore throat or nausea. It is also common  to feel tired.  Have a responsible adult stay with you for the first 24 hours after general anesthesia. It is important to have someone help care for you until you are awake and alert.  When you feel hungry, start by eating small amounts of foods that are soft and easy to digest (bland), such as toast. Gradually return to your regular diet.  Drink enough fluid to keep your urine pale yellow.  Return to your normal activities as told by your health care provider. Ask your health care provider what activities are safe for you. This information is not intended to replace advice given to you by your health care provider. Make sure you discuss any questions you have with your health care provider. Document Released: 04/10/2000 Document Revised: 01/05/2017  Document Reviewed: 08/18/2016 Elsevier Patient Education  2020 [employer].

## 2018-07-16 NOTE — Transfer of Care (Signed)
Immediate Anesthesia Transfer of Care Note  Patient: Theresa Lester  Procedure(s) Performed: KNEE ARTHROSCOPY WITH MEDIAL MENISCUS  ROOT REPAIR (Right Knee)  Patient Location: PACU  Anesthesia Type: General, Regional  Level of Consciousness: awake, alert  and patient cooperative  Airway and Oxygen Therapy: Patient Spontanous Breathing and Patient connected to supplemental oxygen  Post-op Assessment: Post-op Vital signs reviewed, Patient's Cardiovascular Status Stable, Respiratory Function Stable, Patent Airway and No signs of Nausea or vomiting  Post-op Vital Signs: Reviewed and stable  Complications: No apparent anesthesia complications

## 2018-07-16 NOTE — Anesthesia Postprocedure Evaluation (Signed)
Anesthesia Post Note  Patient: Theresa Lester  Procedure(s) Performed: KNEE ARTHROSCOPY WITH MEDIAL MENISCUS  ROOT REPAIR (Right Knee)  Patient location during evaluation: PACU Anesthesia Type: Regional Level of consciousness: awake Pain management: pain level controlled Vital Signs Assessment: post-procedure vital signs reviewed and stable Respiratory status: respiratory function stable Cardiovascular status: stable Postop Assessment: no signs of nausea or vomiting Anesthetic complications: no    Veda Canning

## 2018-08-30 ENCOUNTER — Other Ambulatory Visit: Payer: Self-pay

## 2018-08-30 ENCOUNTER — Ambulatory Visit: Payer: Commercial Managed Care - PPO

## 2018-08-30 DIAGNOSIS — Z23 Encounter for immunization: Secondary | ICD-10-CM

## 2018-08-30 MED ORDER — HEPATITIS B VAC RECOMBINANT 10 MCG/ML IJ SUSP
1.0000 mL | Freq: Once | INTRAMUSCULAR | Status: AC
  Administered 2018-08-30: 10 ug via INTRAMUSCULAR

## 2018-09-13 ENCOUNTER — Ambulatory Visit (INDEPENDENT_AMBULATORY_CARE_PROVIDER_SITE_OTHER): Payer: Commercial Managed Care - PPO | Admitting: Internal Medicine

## 2018-09-13 ENCOUNTER — Other Ambulatory Visit: Payer: Self-pay

## 2018-09-13 ENCOUNTER — Encounter: Payer: Self-pay | Admitting: Internal Medicine

## 2018-09-13 VITALS — BP 128/70 | HR 85 | Ht 63.0 in | Wt 190.0 lb

## 2018-09-13 DIAGNOSIS — Z86018 Personal history of other benign neoplasm: Secondary | ICD-10-CM | POA: Diagnosis not present

## 2018-09-13 DIAGNOSIS — Z0000 Encounter for general adult medical examination without abnormal findings: Secondary | ICD-10-CM

## 2018-09-13 DIAGNOSIS — Z1211 Encounter for screening for malignant neoplasm of colon: Secondary | ICD-10-CM

## 2018-09-13 DIAGNOSIS — Z23 Encounter for immunization: Secondary | ICD-10-CM | POA: Diagnosis not present

## 2018-09-13 DIAGNOSIS — Z1231 Encounter for screening mammogram for malignant neoplasm of breast: Secondary | ICD-10-CM

## 2018-09-13 LAB — POCT URINALYSIS DIPSTICK
Bilirubin, UA: NEGATIVE
Blood, UA: NEGATIVE
Glucose, UA: NEGATIVE
Ketones, UA: NEGATIVE
Leukocytes, UA: NEGATIVE
Nitrite, UA: NEGATIVE
Protein, UA: NEGATIVE
Spec Grav, UA: 1.015 (ref 1.010–1.025)
Urobilinogen, UA: 0.2 E.U./dL
pH, UA: 6 (ref 5.0–8.0)

## 2018-09-13 NOTE — Progress Notes (Signed)
Date:  09/13/2018   Name:  Theresa Lester   DOB:  04-Jan-1971   MRN:  [id number]   Chief Complaint: Annual Exam (Breast Exam. Pap next year.) Theresa Lester is a 48 y.o. female who presents today for her Complete Annual Exam. She feels well. She reports exercising with PTx after knee surgery. She reports she is sleeping well. She denies breast issues.  Mammogram  09/2017 Pap smear  08/2017 normal thin prep Colonoscopy due age 61 next month  HPI  Review of Systems  Constitutional: Negative for chills, fatigue and fever.  HENT: Negative for congestion, hearing loss, tinnitus, trouble swallowing and voice change.   Eyes: Negative for visual disturbance.  Respiratory: Negative for cough, chest tightness, shortness of breath and wheezing.   Cardiovascular: Negative for chest pain, palpitations and leg swelling.  Gastrointestinal: Negative for abdominal pain, constipation, diarrhea and vomiting.  Endocrine: Negative for polydipsia and polyuria.  Genitourinary: Negative for dysuria, frequency, genital sores, vaginal bleeding and vaginal discharge.  Musculoskeletal: Negative for arthralgias, gait problem and joint swelling.  Skin: Negative for color change and rash.  Neurological: Negative for dizziness, tremors, light-headedness and headaches.  Hematological: Negative for adenopathy. Does not bruise/bleed easily.  Psychiatric/Behavioral: Negative for dysphoric mood and sleep disturbance. The patient is not nervous/anxious.     Patient Active Problem List   Diagnosis Date Noted  . Immunization, BCG 03/01/2018  . Plantar fasciitis of right foot 10/11/2016  . Tricompartment osteoarthritis of left knee 10/11/2016  . Hemifacial spasm   . TIA (transient ischemic attack) 06/11/2015  . Episodic tension-type headache, not intractable 04/14/2015  . History of benign schwannoma 01/12/2015  . Mixed migraine and muscle contraction headache 10/20/2014  . Angioneurotic edema  10/19/2014  . Arthralgia of hip or thigh 10/19/2014  . Calculus of gallbladder 10/19/2014  . Acid reflux 10/19/2014  . Blood glucose elevated 10/19/2014  . Hemorrhoids, internal 10/19/2014  . Restless leg 10/19/2014  . Fast heart beat 10/19/2014    No Known Allergies  Past Surgical History:  Procedure Laterality Date  . BILATERAL CARPAL TUNNEL RELEASE    . KNEE ARTHROSCOPY WITH MEDIAL MENISECTOMY Left 01/18/2017   Procedure: KNEE ARTHROSCOPY WITH MEDIAL MENISECTOMY ROOT REPAIR CHONDROPLASTY;  Surgeon: Leim Fabry, MD;  Location: [place];  Service: Orthopedics;  Laterality: Left;  SUPINE WITH ACL LEG HOLDER ARTHREX ALEX  FLIP CUTTER, MENISCUS ROOT GUIDE STRYKER [place] CETERIX AIR  ANESTHESIA: ADDUCTOR CANAL NERVE BLOCK  . KNEE ARTHROSCOPY WITH MEDIAL MENISECTOMY Right 07/16/2018   Procedure: KNEE ARTHROSCOPY WITH MEDIAL MENISCUS  ROOT REPAIR;  Surgeon: Leim Fabry, MD;  Location: [place];  Service: Orthopedics;  Laterality: Right;  MENSICUS ROOT GUIDE KIT + SWIVELOCKS,  SWIVELOCK TIBIAL FIXATION CETERIX OPEN KNEE TRAY BOVIE ELECTROCAUTERY  . TUBAL LIGATION    . VESTIBULAR NERVE SECTION Right 2017    Social History   Tobacco Use  . Smoking status: Never Smoker  . Smokeless tobacco: Never Used  Substance Use Topics  . Alcohol use: No    Alcohol/week: 0.0 standard drinks  . Drug use: No     Medication list has been reviewed and updated.  Current Meds  Medication Sig  . acetaminophen (TYLENOL) 500 MG tablet Take 2 tablets (1,000 mg total) by mouth every 8 (eight) hours.  . carbamazepine (TEGRETOL XR) 100 MG 12 hr tablet Take 2 tablets by mouth daily at 2 PM.     PHQ 2/9 Scores 09/13/2018 09/11/2017 10/11/2016 04/15/2015  PHQ -  2 Score 0 0 0 0    BP Readings from Last 3 Encounters:  09/13/18 128/70  07/16/18 139/78  06/13/18 (!) 152/80    Physical Exam Vitals signs and nursing note reviewed.  Constitutional:      General: She is not in acute  distress.    Appearance: She is well-developed.  HENT:     Head: Normocephalic and atraumatic.     Right Ear: Tympanic membrane and ear canal normal.     Left Ear: Tympanic membrane and ear canal normal.     Nose:     Right Sinus: No maxillary sinus tenderness.     Left Sinus: No maxillary sinus tenderness.     Mouth/Throat:     Pharynx: Uvula midline.  Eyes:     General: No scleral icterus.       Right eye: No discharge.        Left eye: No discharge.     Conjunctiva/sclera: Conjunctivae normal.  Neck:     Musculoskeletal: Normal range of motion. No erythema.     Thyroid: No thyromegaly.     Vascular: No carotid bruit.  Cardiovascular:     Rate and Rhythm: Normal rate and regular rhythm.     Pulses: Normal pulses.     Heart sounds: Normal heart sounds.  Pulmonary:     Effort: Pulmonary effort is normal. No respiratory distress.     Breath sounds: No wheezing.  Chest:     Breasts:        Right: No mass, nipple discharge, skin change or tenderness.        Left: No mass, nipple discharge, skin change or tenderness.  Abdominal:     General: Bowel sounds are normal.     Palpations: Abdomen is soft.     Tenderness: There is no abdominal tenderness.  Lymphadenopathy:     Cervical: No cervical adenopathy.  Skin:    General: Skin is warm and dry.     Findings: No rash.  Neurological:     Mental Status: She is alert and oriented to person, place, and time.     Cranial Nerves: No cranial nerve deficit.     Sensory: No sensory deficit.     Deep Tendon Reflexes: Reflexes are normal and symmetric.  Psychiatric:        Speech: Speech normal.        Behavior: Behavior normal.        Thought Content: Thought content normal.     Wt Readings from Last 3 Encounters:  09/13/18 190 lb (86.2 kg)  07/16/18 182 lb (82.6 kg)  06/13/18 184 lb (83.5 kg)    BP 128/70   Pulse 85   Ht 5' 3"  (1.6 m)   Wt 190 lb (86.2 kg)   SpO2 100%   BMI 33.66 kg/m   Assessment and Plan: 1.  Annual physical exam Normal exam except for weight Encourage healthy diet and regular exercise - CBC with Differential/Platelet - Comprehensive metabolic panel - Lipid panel - TSH - POCT urinalysis dipstick  2. Encounter for screening mammogram for breast cancer Pt to schedule at [place]; Future  3. History of benign schwannoma Will residual inflammation of the trigeminal nerve Symptoms are controlled with carbamazepine She is followed by Neurology in [place]  4. Need for influenza vaccination - Flu Vaccine QUAD 36+ mos IM  5. Colon cancer screening Due next month at age 48 - Ambulatory referral to Gastroenterology  Partially dictated using Editor, commissioning. Any errors are unintentional.  Halina Maidens, MD [facility]  09/13/2018

## 2018-09-14 LAB — CBC WITH DIFFERENTIAL/PLATELET
Basophils Absolute: 0.1 10*3/uL (ref 0.0–0.2)
Basos: 1 %
EOS (ABSOLUTE): 0.2 10*3/uL (ref 0.0–0.4)
Eos: 2 %
Hematocrit: 38.3 % (ref 34.0–46.6)
Hemoglobin: 13 g/dL (ref 11.1–15.9)
Immature Grans (Abs): 0 10*3/uL (ref 0.0–0.1)
Immature Granulocytes: 0 %
Lymphocytes Absolute: 2.1 10*3/uL (ref 0.7–3.1)
Lymphs: 27 %
MCH: 28.8 pg (ref 26.6–33.0)
MCHC: 33.9 g/dL (ref 31.5–35.7)
MCV: 85 fL (ref 79–97)
Monocytes Absolute: 0.6 10*3/uL (ref 0.1–0.9)
Monocytes: 8 %
Neutrophils Absolute: 4.9 10*3/uL (ref 1.4–7.0)
Neutrophils: 62 %
Platelets: 261 10*3/uL (ref 150–450)
RBC: 4.52 x10E6/uL (ref 3.77–5.28)
RDW: 13.7 % (ref 11.7–15.4)
WBC: 7.9 10*3/uL (ref 3.4–10.8)

## 2018-09-14 LAB — COMPREHENSIVE METABOLIC PANEL
ALT: 17 IU/L (ref 0–32)
AST: 17 IU/L (ref 0–40)
Albumin/Globulin Ratio: 1.8 (ref 1.2–2.2)
Albumin: 4.2 g/dL (ref 3.8–4.8)
Alkaline Phosphatase: 90 IU/L (ref 39–117)
BUN/Creatinine Ratio: 17 (ref 9–23)
BUN: 10 mg/dL (ref 6–24)
Bilirubin Total: <0.2 mg/dL (ref 0.0–1.2)
CO2: 24 mmol/L (ref 20–29)
Calcium: 8.9 mg/dL (ref 8.7–10.2)
Chloride: 103 mmol/L (ref 96–106)
Creatinine, Ser: 0.6 mg/dL (ref 0.57–1.00)
GFR calc Af Amer: 124 mL/min/{1.73_m2} (ref >59)
GFR calc non Af Amer: 107 mL/min/{1.73_m2} (ref >59)
Globulin, Total: 2.3 g/dL (ref 1.5–4.5)
Glucose: 78 mg/dL (ref 65–99)
Potassium: 4.3 mmol/L (ref 3.5–5.2)
Sodium: 140 mmol/L (ref 134–144)
Total Protein: 6.5 g/dL (ref 6.0–8.5)

## 2018-09-14 LAB — LIPID PANEL
Chol/HDL Ratio: 2.7 ratio (ref 0.0–4.4)
Cholesterol, Total: 167 mg/dL (ref 100–199)
HDL: 63 mg/dL (ref >39)
LDL Calculated: 69 mg/dL (ref 0–99)
Triglycerides: 177 mg/dL — ABNORMAL HIGH (ref 0–149)
VLDL Cholesterol Cal: 35 mg/dL (ref 5–40)

## 2018-09-14 LAB — TSH: TSH: 5.79 u[IU]/mL — ABNORMAL HIGH (ref 0.450–4.500)

## 2018-10-16 ENCOUNTER — Encounter: Payer: Self-pay | Admitting: Internal Medicine

## 2018-10-16 ENCOUNTER — Telehealth: Payer: Self-pay | Admitting: Gastroenterology

## 2018-10-16 NOTE — Telephone Encounter (Signed)
Pt left vm to schedule a colonoscopy   °

## 2018-10-16 NOTE — Telephone Encounter (Signed)
Attempted to return patients call to schedule her colonoscopy.  Unable to contact her, no answer on home or cell number.  Thanks [employer]

## 2018-10-17 ENCOUNTER — Telehealth: Payer: Self-pay

## 2018-10-17 ENCOUNTER — Other Ambulatory Visit: Payer: Self-pay

## 2018-10-17 DIAGNOSIS — Z1211 Encounter for screening for malignant neoplasm of colon: Secondary | ICD-10-CM

## 2018-10-17 MED ORDER — NA SULFATE-K SULFATE-MG SULF 17.5-3.13-1.6 GM/177ML PO SOLN: 1.0000 | Freq: Once | ORAL | 0 refills | Status: AC

## 2018-10-17 NOTE — Telephone Encounter (Signed)
Gastroenterology Pre-Procedure Review  Request Date: 11/01/18 Requesting Physician: Dr. Allen Norris  PATIENT REVIEW QUESTIONS: The patient responded to the following health history questions as indicated:    1. Are you having any GI issues? no 2. Do you have a personal history of Polyps? no 3. Do you have a family history of Colon Cancer or Polyps? no 4. Diabetes Mellitus? no 5. Joint replacements in the past 12 months?Knee Surgery 08/16/18 6. Major health problems in the past 3 months?no 7. Any artificial heart valves, MVP, or defibrillator?no    MEDICATIONS & ALLERGIES:    Patient reports the following regarding taking any anticoagulation/antiplatelet therapy:   Plavix, Coumadin, Eliquis, Xarelto, Lovenox, Pradaxa, Brilinta, or Effient? no Aspirin? no  Patient confirms/reports the following medications:  Current Outpatient Medications  Medication Sig Dispense Refill  . acetaminophen (TYLENOL) 500 MG tablet Take 2 tablets (1,000 mg total) by mouth every 8 (eight) hours. 90 tablet 2  . carbamazepine (TEGRETOL XR) 100 MG 12 hr tablet Take 2 tablets by mouth daily at 2 PM.      No current facility-administered medications for this visit.     Patient confirms/reports the following allergies:  No Known Allergies  No orders of the defined types were placed in this encounter.   AUTHORIZATION INFORMATION Primary Insurance: 1D#: Group #:  Secondary Insurance: 1D#: Group #:  SCHEDULE INFORMATION: Date: 11/01/18 Time:[facility] Location:

## 2018-10-28 ENCOUNTER — Encounter: Payer: Self-pay | Admitting: *Deleted

## 2018-10-28 ENCOUNTER — Telehealth: Payer: Self-pay | Admitting: Gastroenterology

## 2018-10-28 ENCOUNTER — Telehealth: Payer: Self-pay

## 2018-10-28 ENCOUNTER — Other Ambulatory Visit: Payer: Self-pay

## 2018-10-28 DIAGNOSIS — Z1211 Encounter for screening for malignant neoplasm of colon: Secondary | ICD-10-CM

## 2018-10-28 MED ORDER — NA SULFATE-K SULFATE-MG SULF 17.5-3.13-1.6 GM/177ML PO SOLN: 1.0000 | Freq: Once | ORAL | 0 refills | Status: AC

## 2018-10-28 NOTE — Telephone Encounter (Signed)
Please send instructions to patient by [facility] Chart or e-mail ([email or web address])for procedure this Friday.

## 2018-10-28 NOTE — Telephone Encounter (Signed)
Returned patients call to notify her that I've sent her instructions to her via mychart, rx has been sent to pharmacy for her as well.  Asked her to please call the office back if she has any questions.  She is going for COVID test tomorrow.  Thanks [employer]

## 2018-10-29 ENCOUNTER — Other Ambulatory Visit
Admission: RE | Admit: 2018-10-29 | Discharge: 2018-10-29 | Disposition: A | Discharge status: Home / Self Care | Payer: Commercial Managed Care - PPO | Source: Ambulatory Visit | Attending: Gastroenterology | Admitting: Gastroenterology

## 2018-10-29 DIAGNOSIS — Z01812 Encounter for preprocedural laboratory examination: Secondary | ICD-10-CM | POA: Insufficient documentation

## 2018-10-29 DIAGNOSIS — Z20828 Contact with and (suspected) exposure to other viral communicable diseases: Secondary | ICD-10-CM | POA: Insufficient documentation

## 2018-10-30 LAB — SARS CORONAVIRUS 2 (TAT 6-24 HRS): SARS Coronavirus 2: NEGATIVE

## 2018-10-31 NOTE — Discharge Instructions (Signed)

## 2018-11-01 ENCOUNTER — Ambulatory Visit: Payer: Commercial Managed Care - PPO | Admitting: Anesthesiology

## 2018-11-01 ENCOUNTER — Other Ambulatory Visit: Payer: Self-pay

## 2018-11-01 ENCOUNTER — Encounter
Admission: RE | Disposition: A | Discharge status: Home / Self Care | Payer: Self-pay | Source: Home / Self Care | Attending: Gastroenterology

## 2018-11-01 ENCOUNTER — Ambulatory Visit
Admission: RE | Admit: 2018-11-01 | Discharge: 2018-11-01 | Disposition: A | Discharge status: Home / Self Care | Payer: Commercial Managed Care - PPO | Attending: Gastroenterology | Admitting: Gastroenterology

## 2018-11-01 DIAGNOSIS — Z1211 Encounter for screening for malignant neoplasm of colon: Secondary | ICD-10-CM | POA: Diagnosis present

## 2018-11-01 DIAGNOSIS — D120 Benign neoplasm of cecum: Secondary | ICD-10-CM | POA: Diagnosis not present

## 2018-11-01 HISTORY — PX: POLYPECTOMY: SHX5525

## 2018-11-01 HISTORY — PX: COLONOSCOPY WITH PROPOFOL: SHX5780

## 2018-11-01 LAB — POCT PREGNANCY, URINE: Preg Test, Ur: NEGATIVE

## 2018-11-01 SURGERY — COLONOSCOPY WITH PROPOFOL
Anesthesia: General | Site: Rectum

## 2018-11-01 MED ORDER — LACTATED RINGERS IV SOLN
100.0000 mL/h | INTRAVENOUS | Status: DC
  Administered 2018-11-01: 100 mL/h via INTRAVENOUS

## 2018-11-01 MED ORDER — LACTATED RINGERS IV SOLN: INTRAVENOUS | Status: DC

## 2018-11-01 MED ORDER — STERILE WATER FOR IRRIGATION IR SOLN
Status: DC | PRN
  Administered 2018-11-01: 09:00:00

## 2018-11-01 MED ORDER — SODIUM CHLORIDE 0.9 % IV SOLN: INTRAVENOUS | Status: DC

## 2018-11-01 MED ORDER — LIDOCAINE HCL (CARDIAC) PF 100 MG/5ML IV SOSY
PREFILLED_SYRINGE | INTRAVENOUS | Status: DC | PRN
  Administered 2018-11-01: 30 mg via INTRAVENOUS

## 2018-11-01 MED ORDER — PROPOFOL 10 MG/ML IV BOLUS
INTRAVENOUS | Status: DC | PRN
  Administered 2018-11-01: 30 mg via INTRAVENOUS
  Administered 2018-11-01 (×2): 40 mg via INTRAVENOUS
  Administered 2018-11-01: 30 mg via INTRAVENOUS
  Administered 2018-11-01: 140 mg via INTRAVENOUS
  Administered 2018-11-01: 20 mg via INTRAVENOUS
  Administered 2018-11-01 (×3): 30 mg via INTRAVENOUS
  Administered 2018-11-01: 40 mg via INTRAVENOUS

## 2018-11-01 SURGICAL SUPPLY — 6 items
CANISTER SUCT 1200ML W/VALVE (MISCELLANEOUS) ×2 IMPLANT
FORCEPS BIOP RAD 4 LRG CAP 4 (CUTTING FORCEPS) ×2 IMPLANT
GOWN CVR UNV OPN BCK APRN NK (MISCELLANEOUS) ×2 IMPLANT
GOWN ISOL THUMB LOOP REG UNIV (MISCELLANEOUS) ×2
KIT ENDO PROCEDURE OLY (KITS) ×2 IMPLANT
WATER STERILE IRR 250ML POUR (IV SOLUTION) ×2 IMPLANT

## 2018-11-01 NOTE — Anesthesia Postprocedure Evaluation (Signed)
Anesthesia Post Note  Patient: Theresa Lester  Procedure(s) Performed: COLONOSCOPY WITH BIOPSY (N/A Rectum) POLYPECTOMY (N/A Rectum)  Patient location during evaluation: PACU Anesthesia Type: General Level of consciousness: awake and alert Pain management: pain level controlled Vital Signs Assessment: post-procedure vital signs reviewed and stable Respiratory status: spontaneous breathing, nonlabored ventilation, respiratory function stable and patient connected to nasal cannula oxygen Cardiovascular status: blood pressure returned to baseline and stable Postop Assessment: no apparent nausea or vomiting Anesthetic complications: no    Montrae Braithwaite

## 2018-11-01 NOTE — Op Note (Signed)
[facility] Patient Name: Theresa Lester Procedure Date: 11/01/2018 9:13 AM MRN: [id number] Account #: [id number] Date of Birth: 09/08/1970 Admit Type: Outpatient Age: 48 Room: [facility] OR ROOM 01 Gender: Female Note Status: Finalized Procedure:            Colonoscopy Indications:          Screening for colorectal malignant neoplasm Providers:            [name] MD, MD Referring MD:         Halina Maidens, MD (Referring MD) Medicines:            Propofol per Anesthesia Complications:        No immediate complications. Procedure:            Pre-Anesthesia Assessment:                       - Prior to the procedure, a History and Physical was                        performed, and patient medications and allergies were                        reviewed. The patient's tolerance of previous                        anesthesia was also reviewed. The risks and benefits of                        the procedure and the sedation options and risks were                        discussed with the patient. All questions were                        answered, and informed consent was obtained. Prior                        Anticoagulants: The patient has taken no previous                        anticoagulant or antiplatelet agents. ASA Grade                        Assessment: II - A patient with mild systemic disease.                        After reviewing the risks and benefits, the patient was                        deemed in satisfactory condition to undergo the                        procedure.                       After obtaining informed consent, the colonoscope was                        passed under direct vision. Throughout the procedure,  the patient's blood pressure, pulse, and oxygen                        saturations were monitored continuously. The                        Colonoscope was introduced through the anus and             advanced to the the cecum, identified by appendiceal                        orifice and ileocecal valve. The colonoscopy was                        performed without difficulty. The patient tolerated the                        procedure well. The quality of the bowel preparation                        was excellent. Findings:      The perianal and digital rectal examinations were normal.      A 3 mm polyp was found in the cecum. The polyp was sessile. The polyp       was removed with a cold biopsy forceps. Resection and retrieval were       complete. Impression:           - One 3 mm polyp in the cecum, removed with a cold                        biopsy forceps. Resected and retrieved. Recommendation:       - Discharge patient to home.                       - Resume previous diet.                       - Continue present medications.                       - Await pathology results.                       - Repeat colonoscopy in 5 years if polyp adenoma and 10                        years if hyperplastic Procedure Code(s):    --- Professional ---                       [id number], Colonoscopy, flexible; with biopsy, single or                        multiple Diagnosis Code(s):    --- Professional ---                       Z12.11, Encounter for screening for malignant neoplasm                        of colon  K63.5, Polyp of colon CPT copyright 2019 American Medical Association. All rights reserved. The codes documented in this report are preliminary and upon coder review may  be revised to meet current compliance requirements. [name] MD, MD 11/01/2018 9:37:49 AM This report has been signed electronically. Number of Addenda: 0 Note Initiated On: 11/01/2018 9:13 AM Scope Withdrawal Time: 0 hours 8 minutes 30 seconds  Total Procedure Duration: 0 hours 13 minutes 14 seconds  Estimated Blood Loss: Estimated blood loss: none.      [facility]

## 2018-11-01 NOTE — H&P (Signed)
Theresa Lame, MD [facility] [address], [place], [place] [address] Phone: [phone] Fax : [phone]  Primary Care Physician:  [name], MD Primary Gastroenterologist:  Dr. Allen Norris  Pre-Procedure History & Physical: HPI:  Theresa Lester is a 48 y.o. female is here for a screening colonoscopy.   Past Medical History:  Diagnosis Date  . Acoustic neuroma ([place])   . Calculus of gallbladder   . GERD (gastroesophageal reflux disease)   . Hemorrhoids, internal   . Left axillary hidradenitis 11/05/2015  . Restless leg   . Tinnitus 11/25/2014    Past Surgical History:  Procedure Laterality Date  . BILATERAL CARPAL TUNNEL RELEASE    . KNEE ARTHROSCOPY WITH MEDIAL MENISECTOMY Left 01/18/2017   Procedure: KNEE ARTHROSCOPY WITH MEDIAL MENISECTOMY ROOT REPAIR CHONDROPLASTY;  Surgeon: Leim Fabry, MD;  Location: [facility];  Service: Orthopedics;  Laterality: Left;  SUPINE WITH ACL LEG HOLDER ARTHREX ALEX  FLIP CUTTER, MENISCUS ROOT GUIDE STRYKER [place] CETERIX AIR  ANESTHESIA: ADDUCTOR CANAL NERVE BLOCK  . KNEE ARTHROSCOPY WITH MEDIAL MENISECTOMY Right 07/16/2018   Procedure: KNEE ARTHROSCOPY WITH MEDIAL MENISCUS  ROOT REPAIR;  Surgeon: Leim Fabry, MD;  Location: [facility];  Service: Orthopedics;  Laterality: Right;  MENSICUS ROOT GUIDE KIT + SWIVELOCKS,  SWIVELOCK TIBIAL FIXATION CETERIX OPEN KNEE TRAY BOVIE ELECTROCAUTERY  . TUBAL LIGATION    . VESTIBULAR NERVE SECTION Right 2017    Prior to Admission medications   Medication Sig Start Date End Date Taking? Authorizing Provider  acetaminophen (TYLENOL) 500 MG tablet Take 2 tablets (1,000 mg total) by mouth every 8 (eight) hours. 07/16/18 07/16/19 Yes Leim Fabry, MD  carbamazepine (TEGRETOL XR) 100 MG 12 hr tablet Take 2 tablets by mouth daily at 2 PM.  04/22/18  Yes [provider]    Allergies as of 10/17/2018  . (No Known Allergies)    Family History  Problem Relation Age of  Onset  . Hypertension Mother   . Diabetes Mother   . Breast cancer Neg Hx     Social History   Socioeconomic History  . Marital status: Married    Spouse name: Not on file  . Number of children: Not on file  . Years of education: Not on file  . Highest education level: Not on file  Occupational History  . Not on file  Social Needs  . Financial resource strain: Not on file  . Food insecurity    Worry: Not on file    Inability: Not on file  . Transportation needs    Medical: Not on file    Non-medical: Not on file  Tobacco Use  . Smoking status: Never Smoker  . Smokeless tobacco: Never Used  Substance and Sexual Activity  . Alcohol use: No    Alcohol/week: 0.0 standard drinks  . Drug use: No  . Sexual activity: Yes  Lifestyle  . Physical activity    Days per week: Not on file    Minutes per session: Not on file  . Stress: Not on file  Relationships  . Social Herbalist on phone: Not on file    Gets together: Not on file    Attends religious service: Not on file    Active member of club or organization: Not on file    Attends meetings of clubs or organizations: Not on file    Relationship status: Not on file  . Intimate partner violence    Fear of current  or ex partner: No    Emotionally abused: No    Physically abused: No    Forced sexual activity: No  Other Topics Concern  . Not on file  Social History Narrative  . Not on file    Review of Systems: See HPI, otherwise negative ROS  Physical Exam: BP 123/89   Pulse 90   Temp 97.9 F (36.6 C) (Temporal)   Ht _0  (1.6 m)   Wt 83.5 kg   LMP 10/23/2018 (Exact Date) Comment: preg test neg  SpO2 98%   BMI 32.59 kg/m  General:   Alert,  pleasant and cooperative in NAD Head:  Normocephalic and atraumatic. Neck:  Supple; no masses or thyromegaly. Lungs:  Clear throughout to auscultation.    Heart:  Regular rate and rhythm. Abdomen:  Soft, nontender and nondistended. Normal bowel sounds,  without guarding, and without rebound.   Neurologic:  Alert and  oriented x4;  grossly normal neurologically.  Impression/Plan: Theresa Lester is now here to undergo a screening colonoscopy.  Risks, benefits, and alternatives regarding colonoscopy have been reviewed with the patient.  Questions have been answered.  All parties agreeable.

## 2018-11-01 NOTE — Transfer of Care (Signed)
Immediate Anesthesia Transfer of Care Note  Patient: Theresa Lester  Procedure(s) Performed: COLONOSCOPY WITH BIOPSY (N/A Rectum) POLYPECTOMY (N/A Rectum)  Patient Location: PACU  Anesthesia Type: General  Level of Consciousness: awake, alert  and patient cooperative  Airway and Oxygen Therapy: Patient Spontanous Breathing and Patient connected to supplemental oxygen  Post-op Assessment: Post-op Vital signs reviewed, Patient's Cardiovascular Status Stable, Respiratory Function Stable, Patent Airway and No signs of Nausea or vomiting  Post-op Vital Signs: Reviewed and stable  Complications: No apparent anesthesia complications

## 2018-11-01 NOTE — Anesthesia Procedure Notes (Signed)
Date/Time: 11/01/2018 9:17 AM Performed by: Cameron Ali, CRNA Pre-anesthesia Checklist: Patient identified, Emergency Drugs available, Suction available, Timeout performed and Patient being monitored Patient Re-evaluated:Patient Re-evaluated prior to induction Oxygen Delivery Method: Nasal cannula Placement Confirmation: positive ETCO2

## 2018-11-01 NOTE — Anesthesia Preprocedure Evaluation (Signed)
Anesthesia Evaluation  Patient identified by MRN, date of birth, ID band Patient awake    Reviewed: NPO status   Airway Mallampati: II  TM Distance: >3 FB Neck ROM: full    Dental no notable dental hx.    Pulmonary neg pulmonary ROS,    Pulmonary exam normal        Cardiovascular Exercise Tolerance: Good negative cardio ROS Normal cardiovascular exam     Neuro/Psych  Headaches, Acoustic neuroma negative psych ROS   GI/Hepatic Neg liver ROS, GERD  Controlled,  Endo/Other  negative endocrine ROS  Renal/GU negative Renal ROS  negative genitourinary   Musculoskeletal  (+) Arthritis ,   Abdominal   Peds  Hematology Acoustic neuroma > stable   Anesthesia Other Findings Covid: NEG. HCG: NEG.  Reproductive/Obstetrics negative OB ROS                             Anesthesia Physical Anesthesia Plan  ASA: II  Anesthesia Plan: General   Post-op Pain Management:    Induction:   PONV Risk Score and Plan: 1 and TIVA and Propofol infusion  Airway Management Planned:   Additional Equipment:   Intra-op Plan:   Post-operative Plan:   Informed Consent: I have reviewed the patients History and Physical, chart, labs and discussed the procedure including the risks, benefits and alternatives for the proposed anesthesia with the patient or authorized representative who has indicated his/her understanding and acceptance.       Plan Discussed with: CRNA  Anesthesia Plan Comments:         Anesthesia Quick Evaluation

## 2018-11-04 ENCOUNTER — Encounter: Payer: Self-pay | Admitting: Gastroenterology

## 2018-11-20 ENCOUNTER — Ambulatory Visit
Admission: RE | Admit: 2018-11-20 | Discharge: 2018-11-20 | Disposition: A | Discharge status: Home / Self Care | Payer: Commercial Managed Care - PPO | Source: Ambulatory Visit | Attending: Radiation Oncology | Admitting: Radiation Oncology

## 2018-11-20 ENCOUNTER — Ambulatory Visit
Admission: RE | Admit: 2018-11-20 | Discharge: 2018-11-20 | Disposition: A | Discharge status: Home / Self Care | Payer: Commercial Managed Care - PPO | Source: Ambulatory Visit

## 2018-11-20 ENCOUNTER — Other Ambulatory Visit: Payer: Self-pay | Admitting: Radiation Therapy

## 2018-11-20 ENCOUNTER — Other Ambulatory Visit: Payer: Self-pay

## 2018-11-20 DIAGNOSIS — D332 Benign neoplasm of brain, unspecified: Secondary | ICD-10-CM

## 2018-11-20 DIAGNOSIS — D333 Benign neoplasm of cranial nerves: Secondary | ICD-10-CM

## 2018-11-21 ENCOUNTER — Other Ambulatory Visit: Payer: Self-pay | Admitting: Urology

## 2018-11-21 ENCOUNTER — Telehealth: Payer: Self-pay | Admitting: Radiation Oncology

## 2018-11-21 ENCOUNTER — Other Ambulatory Visit: Payer: Self-pay | Admitting: Radiation Oncology

## 2018-11-21 DIAGNOSIS — D332 Benign neoplasm of brain, unspecified: Secondary | ICD-10-CM

## 2018-11-21 MED ORDER — CARBAMAZEPINE ER 100 MG PO TB12: 100.0000 mg | ORAL_TABLET | Freq: Two times a day (BID) | ORAL | 0 refills | Status: DC

## 2018-11-21 NOTE — Telephone Encounter (Signed)
-----   Message from Wilmon Arms, RN sent at 11/21/2018 10:17 AM EST ----- Regarding: RE: Medication refill request He will be able to review her refill at her appointment for sure since its a seizure medication. However, if she is going to run out before her visit with Dr. Mickeal Skinner then she will need 1 more refill from [facility] until she is seen in his clinic.  Thanks, Kim  ----- Message ----- From: Heywood Footman, RN Sent: 11/20/2018   3:08 PM EST To: Wilmon Arms, RN Subject: FW: Medication refill request                  Please let me know if I can do anything to assist. Sam ----- Message ----- From: Freeman Caldron, PA-C Sent: 11/20/2018   2:16 PM EST To: Heywood Footman, RN Subject: RE: Medication refill request                  Please check with Shelle Iron, RN to see if Mickeal Skinner would be willing to refill this for her so that future requests do not continue to come to [place].  If so, please call [facility] and let them know that this and all further refills of her carbamazepine should be requested through Dr. Cecil Cobbs since he is assuming her care going forward.  If Dr. Mickeal Skinner prefers otherwise, just let me know and I am happy to refill the Rx for 1 month and then let him take over from there. Thank you!!! -Ashlyn ----- Message ----- From: Heywood Footman, RN Sent: 11/20/2018   8:57 AM EST To: Freeman Caldron, PA-C Subject: Medication refill request                      I received a refill request from [facility] for this patient's carbamazepine. The patient is scheduled for an MRI on 11/20/2018. Then, an initial consult with Dr. Mickeal Skinner on 11/25/2018.   Diagnosis:   49 yo woman with right intracanicular 7 mm vestibular schwannoma and intact right hearing.  Interval Since Last Radiation:  2 years and 9 months  03/01/2015-03/10/2015: Stereotactic radiosurgery to the 7 mm vestibular schwannoma to 25 Gy in 5 fractions of 5 Gy each.

## 2018-11-21 NOTE — Telephone Encounter (Signed)
Phoned patient's cell. No answer and unable to leave a message because her voicemail is full. Phoned patient's home. Spoke with patient and explained her Tegretol refill is ready for pick up at her pharmacy. Explained that Dr. Mickeal Skinner will refill this medication moving forward. The patient verbalized understanding and expressed appreciation for the call.

## 2018-11-21 NOTE — Telephone Encounter (Signed)
Received voicemail message from Alda at [facility]. She reports this patient was unable to complete the contrast portion of her brain MRI and they were just going to reschedule her but received a call request they just read what they had. Lonn Georgia reports the results of what were obtained are now in Epic for review.

## 2018-11-21 NOTE — Telephone Encounter (Signed)
Phoned patient to inquire about number of tablets left of carbamazepine. Patient reports she only has one tablet left that she plans to take tonight. Assured patient this refill would be handled today. Patient not scheduled to see Dr. Mickeal Skinner until Monday, November 9th. [name], PA-C informed of these findings.

## 2018-11-25 ENCOUNTER — Telehealth: Payer: Self-pay | Admitting: Internal Medicine

## 2018-11-25 ENCOUNTER — Inpatient Hospital Stay: Payer: Commercial Managed Care - PPO | Attending: Internal Medicine | Admitting: Internal Medicine

## 2018-11-25 ENCOUNTER — Other Ambulatory Visit: Payer: Self-pay

## 2018-11-25 VITALS — BP 149/86 | HR 85 | Temp 97.9°F | Resp 17 | Ht 63.0 in | Wt 191.8 lb

## 2018-11-25 DIAGNOSIS — Z8719 Personal history of other diseases of the digestive system: Secondary | ICD-10-CM | POA: Insufficient documentation

## 2018-11-25 DIAGNOSIS — R253 Fasciculation: Secondary | ICD-10-CM | POA: Insufficient documentation

## 2018-11-25 DIAGNOSIS — Z8249 Family history of ischemic heart disease and other diseases of the circulatory system: Secondary | ICD-10-CM | POA: Insufficient documentation

## 2018-11-25 DIAGNOSIS — Z833 Family history of diabetes mellitus: Secondary | ICD-10-CM | POA: Insufficient documentation

## 2018-11-25 DIAGNOSIS — H9190 Unspecified hearing loss, unspecified ear: Secondary | ICD-10-CM | POA: Diagnosis not present

## 2018-11-25 DIAGNOSIS — D333 Benign neoplasm of cranial nerves: Secondary | ICD-10-CM | POA: Diagnosis present

## 2018-11-25 MED ORDER — LORAZEPAM 1 MG PO TABS: 1.0000 mg | ORAL_TABLET | Freq: Once | ORAL | 0 refills | Status: DC | PRN

## 2018-11-25 NOTE — Telephone Encounter (Signed)
Scheduled appt per 11/9 los.  Spoke with pt and she is aware of the appt date and time.

## 2018-11-26 NOTE — Progress Notes (Signed)
[facility] [place], [place] [address] [phone]   New Patient Evaluation  Date of Service: 11/26/18 Patient Name: Theresa Lester Patient MRN: [id number] Patient DOB: 29-Jan-1970 Provider: Ventura Sellers, MD  Identifying Statement:  Theresa Lester is a 48 y.o. female with R vestibular schwannoma  Referring Provider: [name], MD [address] [place],  [place] [address]  Oncologic History: 03/01/2015-03/10/2015: Stereotactic radiosurgery to the 7 mm vestibular schwannoma to 25 Gy in 5 fractions of 5 Gy each.   History of Present Illness: The patient's records from the referring physician were obtained and reviewed and the patient interviewed to confirm this HPI.  Theresa Lester presents today following recent brain MRI.  She describes overall stability of hearing loss, and she is now interested in obtaining hearing aids through ENT/audiology. No issues with left sided hearing.  Facial twitching including the eye muscles on the right side is stable and persistent if tegretol is not used.  No issue with balance, coordination, gait or memory.    Medications: Current Outpatient Medications on File Prior to Visit  Medication Sig Dispense Refill  . acetaminophen (TYLENOL) 500 MG tablet Take 2 tablets (1,000 mg total) by mouth every 8 (eight) hours. 90 tablet 2  . carbamazepine (TEGRETOL XR) 100 MG 12 hr tablet Take 1 tablet (100 mg total) by mouth 2 (two) times daily. 60 tablet 0   No current facility-administered medications on file prior to visit.     Allergies: No Known Allergies Past Medical History:  Past Medical History:  Diagnosis Date  . Acoustic neuroma ([place])   . Calculus of gallbladder   . GERD (gastroesophageal reflux disease)   . Hemorrhoids, internal   . Left axillary hidradenitis 11/05/2015  . Restless leg   . Tinnitus 11/25/2014   Past Surgical History:  Past  Surgical History:  Procedure Laterality Date  . BILATERAL CARPAL TUNNEL RELEASE    . COLONOSCOPY WITH PROPOFOL N/A 11/01/2018   Procedure: COLONOSCOPY WITH BIOPSY;  Surgeon: [name], MD;  Location: [facility];  Service: Endoscopy;  Laterality: N/A;  . KNEE ARTHROSCOPY WITH MEDIAL MENISECTOMY Left 01/18/2017   Procedure: KNEE ARTHROSCOPY WITH MEDIAL MENISECTOMY ROOT REPAIR CHONDROPLASTY;  Surgeon: Leim Fabry, MD;  Location: [facility];  Service: Orthopedics;  Laterality: Left;  SUPINE WITH ACL LEG HOLDER ARTHREX ALEX  FLIP CUTTER, MENISCUS ROOT GUIDE STRYKER [place] CETERIX AIR  ANESTHESIA: ADDUCTOR CANAL NERVE BLOCK  . KNEE ARTHROSCOPY WITH MEDIAL MENISECTOMY Right 07/16/2018   Procedure: KNEE ARTHROSCOPY WITH MEDIAL MENISCUS  ROOT REPAIR;  Surgeon: Leim Fabry, MD;  Location: [facility];  Service: Orthopedics;  Laterality: Right;  MENSICUS ROOT GUIDE KIT + SWIVELOCKS,  SWIVELOCK TIBIAL FIXATION CETERIX OPEN KNEE TRAY BOVIE ELECTROCAUTERY  . POLYPECTOMY N/A 11/01/2018   Procedure: POLYPECTOMY;  Surgeon: [name], MD;  Location: [facility];  Service: Endoscopy;  Laterality: N/A;  . TUBAL LIGATION    . VESTIBULAR NERVE SECTION Right 2017   Social History:  Social History   Socioeconomic History  . Marital status: Married    Spouse name: Not on file  . Number of children: Not on file  . Years of education: Not on file  . Highest education level: Not on file  Occupational History  . Not on file  Social Needs  . Financial resource strain: Not on file  . Food insecurity    Worry: Not on file  Inability: Not on file  . Transportation needs    Medical: Not on file    Non-medical: Not on file  Tobacco Use  . Smoking status: Never Smoker  . Smokeless tobacco: Never Used  Substance and Sexual Activity  . Alcohol use: No    Alcohol/week: 0.0 standard drinks  . Drug use: No  . Sexual activity: Yes  Lifestyle  . Physical activity    Days  per week: Not on file    Minutes per session: Not on file  . Stress: Not on file  Relationships  . Social Herbalist on phone: Not on file    Gets together: Not on file    Attends religious service: Not on file    Active member of club or organization: Not on file    Attends meetings of clubs or organizations: Not on file    Relationship status: Not on file  . Intimate partner violence    Fear of current or ex partner: No    Emotionally abused: No    Physically abused: No    Forced sexual activity: No  Other Topics Concern  . Not on file  Social History Narrative  . Not on file   Family History:  Family History  Problem Relation Age of Onset  . Hypertension Mother   . Diabetes Mother   . Breast cancer Neg Hx     Review of Systems: Constitutional: Denies fevers, chills or abnormal weight loss Eyes: Denies blurriness of vision Ears, nose, mouth, throat, and face: Denies mucositis or sore throat Respiratory: Denies cough, dyspnea or wheezes Cardiovascular: Denies palpitation, chest discomfort or lower extremity swelling Gastrointestinal:  Denies nausea, constipation, diarrhea GU: Denies dysuria or incontinence Skin: Denies abnormal skin rashes Neurological: Per HPI Musculoskeletal: Denies joint pain, back or neck discomfort. No decrease in ROM Behavioral/Psych: Denies anxiety, disturbance in thought content, and mood instability  Physical Exam: Vitals:   11/25/18 1010  BP: (!) 149/86  Pulse: 85  Resp: 17  Temp: 97.9 F (36.6 C)  SpO2: 100%   KPS: 90. General: Alert, cooperative, pleasant, in no acute distress Head: Normal EENT: Impaired hearing right ear Lungs: Resp effort normal Cardiac: Regular rate and rhythm Abdomen: Soft, non-distended abdomen Skin: No rashes cyanosis or petechiae. Extremities: No clubbing or edema  Neurologic Exam: Mental Status: Awake, alert, attentive to examiner. Oriented to self and environment. Language is fluent with  intact comprehension.  Cranial Nerves: Visual acuity is grossly normal. Visual fields are full. Extra-ocular movements intact. No ptosis. Face is symmetric, tongue midline. Motor: Tone and bulk are normal. Power is full in both arms and legs. Reflexes are symmetric, no pathologic reflexes present. Intact finger to nose bilaterally Sensory: Intact to light touch and temperature Gait: Normal and tandem gait is normal.   Labs: I have reviewed the data as listed    Component Value Date/Time   NA 140 09/13/2018 1115   K 4.3 09/13/2018 1115   CL 103 09/13/2018 1115   CO2 24 09/13/2018 1115   GLUCOSE 78 09/13/2018 1115   GLUCOSE 104 (H) 06/11/2015 1748   BUN 10 09/13/2018 1115   BUN 13.0 02/17/2015 1505   CREATININE 0.60 09/13/2018 1115   CREATININE 0.7 02/17/2015 1505   CALCIUM 8.9 09/13/2018 1115   PROT 6.5 09/13/2018 1115   ALBUMIN 4.2 09/13/2018 1115   AST 17 09/13/2018 1115   ALT 17 09/13/2018 1115   ALKPHOS 90 09/13/2018 1115   BILITOT <0.2 09/13/2018 1115  GFRNONAA 107 09/13/2018 1115   GFRAA 124 09/13/2018 1115   Lab Results  Component Value Date   WBC 7.9 09/13/2018   NEUTROABS 4.9 09/13/2018   HGB 13.0 09/13/2018   HCT 38.3 09/13/2018   MCV 85 09/13/2018   PLT 261 09/13/2018    Imaging: [facility] Clinician Interpretation: I have personally reviewed the CNS images as listed.  My interpretation, in the context of the patient's clinical presentation, is stable disease  Mr Brain Wo Contrast  Result Date: 11/21/2018 CLINICAL DATA:  Right vestibular schwannoma post treatment, follow-up EXAM: MRI HEAD WITHOUT CONTRAST TECHNIQUE: Multiplanar, multiecho pulse sequences of the brain and surrounding structures were obtained without intravenous contrast. COMPARISON:  November 15, 2017. FINDINGS: Brain: Patient was unable to tolerate post-contrast portion of the study. Soft tissue within the right internal auditory canal measures similar in size at approximately 8 x 5 mm (previously 8  x 6 mm). No acute infarction or intracranial hemorrhage. Punctate focus of T2 hyperintensity is again identified in the right posterosuperior temporal subcortical white matter likely reflecting nonspecific gliosis/demyelination. No new intracranial mass. Vascular: Major vessel flow voids at the skull base are preserved. Skull and upper cervical spine: Normal marrow signal is preserved. Sinuses/Orbits: Aerated.  Orbits are unremarkable. Other: Mastoid air cells are clear. IMPRESSION: No substantial change in size of the treated right vestibular schwannoma. No new findings. Electronically Signed   By: Macy Mis M.D.   On: 11/21/2018 10:46    Assessment/Plan Unilateral vestibular schwannoma ([place]) [D33.3]  We appreciate the opportunity to participate in the care of [name].   She is clinically and radiographically stable now 3 years removed from [facility].    She should continue Tegretol 120m BID for facial twitching.  We spent twenty additional minutes teaching regarding the natural history, biology, and historical experience in the treatment of brain tumors. We then discussed in detail the current recommendations for therapy focusing on the mode of administration, mechanism of action, anticipated toxicities, and quality of life issues associated with this plan. We also provided teaching sheets for the patient to take home as an additional resource.  She should return in 1 year following MRI brain for evaluation.  All questions were answered. The patient knows to call the clinic with any problems, questions or concerns. No barriers to learning were detected.  The total time spent in the encounter was 40 minutes and more than 50% was on counseling and review of test results   ZVentura Sellers MD Medical Director of Neuro-Oncology [facility] Centerat WPort [place]11/10/20 2:10 PM

## 2018-11-27 ENCOUNTER — Other Ambulatory Visit: Payer: Self-pay | Admitting: Radiation Therapy

## 2018-12-31 ENCOUNTER — Other Ambulatory Visit: Payer: Self-pay | Admitting: Internal Medicine

## 2018-12-31 MED ORDER — CARBAMAZEPINE ER 100 MG PO TB12: 100.0000 mg | ORAL_TABLET | Freq: Two times a day (BID) | ORAL | 3 refills | Status: DC

## 2019-02-19 ENCOUNTER — Ambulatory Visit: Payer: Commercial Managed Care - PPO | Admitting: Internal Medicine

## 2019-02-25 ENCOUNTER — Encounter: Payer: Self-pay | Admitting: Internal Medicine

## 2019-02-25 ENCOUNTER — Ambulatory Visit (INDEPENDENT_AMBULATORY_CARE_PROVIDER_SITE_OTHER): Payer: Commercial Managed Care - PPO | Admitting: Internal Medicine

## 2019-02-25 ENCOUNTER — Other Ambulatory Visit: Payer: Self-pay

## 2019-02-25 VITALS — Temp 98.7°F | Ht 63.0 in | Wt 191.0 lb

## 2019-02-25 DIAGNOSIS — J45909 Unspecified asthma, uncomplicated: Secondary | ICD-10-CM | POA: Insufficient documentation

## 2019-02-25 DIAGNOSIS — R0602 Shortness of breath: Secondary | ICD-10-CM

## 2019-02-25 MED ORDER — PULMICORT FLEXHALER 90 MCG/ACT IN AEPB: 1.0000 | INHALATION_SPRAY | Freq: Two times a day (BID) | RESPIRATORY_TRACT | 1 refills | Status: DC

## 2019-02-25 NOTE — Progress Notes (Signed)
Date:  02/25/2019   Name:  Theresa Lester   DOB:  1970/10/22   MRN:  [id number]  I connected with this patient, Theresa Lester, by telephone at the patient's home.  I verified that I am speaking with the correct person using two identifiers. This visit was conducted via telephone due to the Covid-19 outbreak from my office at [facility] in [place], [place]. I discussed the limitations, risks, security and privacy concerns of performing an evaluation and management service by telephone. I also discussed with the patient that there may be a patient responsible charge related to this service. The patient expressed understanding and agreed to proceed.  Chief Complaint: Shortness of Breath (Shortness of breathe when walking short distances or going up and down stairs. Dry Cough on and off. Fills like her " lungs are filling with fluid and makes me cough." Started about 2 months ago. )  Shortness of Breath This is a new problem. Episode onset: maybe 2 months ago but unsure. The problem occurs daily. The problem has been unchanged. Associated symptoms include PND (once - maybe had a bad dream) and sputum production (white thin sputum). Pertinent negatives include no chest pain, fever, headaches, orthopnea or wheezing. Treatments tried: mucinex. The treatment provided no relief. Her past medical history is significant for asthma (as a child - grew out of it). (Life long second hand smoke exposure)    Lab Results  Component Value Date   CREATININE 0.60 09/13/2018   BUN 10 09/13/2018   NA 140 09/13/2018   K 4.3 09/13/2018   CL 103 09/13/2018   CO2 24 09/13/2018   Lab Results  Component Value Date   CHOL 167 09/13/2018   HDL 63 09/13/2018   LDLCALC 69 09/13/2018   TRIG 177 (H) 09/13/2018   CHOLHDL 2.7 09/13/2018   Lab Results  Component Value Date   TSH 5.790 (H) 09/13/2018   No results found for: HGBA1C   Review of Systems  Constitutional: Negative for chills,  diaphoresis, fever and unexpected weight change.  HENT: Negative for trouble swallowing.   Respiratory: Positive for cough, sputum production (white thin sputum), chest tightness and shortness of breath. Negative for wheezing.   Cardiovascular: Positive for PND (once - maybe had a bad dream). Negative for chest pain, palpitations and orthopnea.  Neurological: Negative for dizziness, light-headedness and headaches.  Psychiatric/Behavioral: Positive for decreased concentration (since treatment for schwanoma).    Patient Active Problem List   Diagnosis Date Noted  . Encounter for screening colonoscopy   . Benign neoplasm of cecum   . Immunization, BCG 03/01/2018  . Plantar fasciitis of right foot 10/11/2016  . Tricompartment osteoarthritis of left knee 10/11/2016  . Hemifacial spasm   . TIA (transient ischemic attack) 06/11/2015  . Episodic tension-type headache, not intractable 04/14/2015  . Unilateral vestibular schwannoma ([place]) 01/12/2015  . Angioneurotic edema 10/19/2014  . Arthralgia of hip or thigh 10/19/2014  . Calculus of gallbladder 10/19/2014  . Acid reflux 10/19/2014  . Blood glucose elevated 10/19/2014  . Hemorrhoids, internal 10/19/2014  . Restless leg 10/19/2014  . Fast heart beat 10/19/2014    No Known Allergies  Past Surgical History:  Procedure Laterality Date  . BILATERAL CARPAL TUNNEL RELEASE    . COLONOSCOPY WITH PROPOFOL N/A 11/01/2018   Procedure: COLONOSCOPY WITH BIOPSY;  Surgeon: [name], MD;  Location: [place];  Service: Endoscopy;  Laterality: N/A;  . KNEE ARTHROSCOPY WITH MEDIAL MENISECTOMY Left 01/18/2017   Procedure:  KNEE ARTHROSCOPY WITH MEDIAL MENISECTOMY ROOT REPAIR CHONDROPLASTY;  Surgeon: Leim Fabry, MD;  Location: [place];  Service: Orthopedics;  Laterality: Left;  SUPINE WITH ACL LEG HOLDER ARTHREX ALEX  FLIP CUTTER, MENISCUS ROOT GUIDE STRYKER [place] CETERIX AIR  ANESTHESIA: ADDUCTOR CANAL NERVE BLOCK  . KNEE  ARTHROSCOPY WITH MEDIAL MENISECTOMY Right 07/16/2018   Procedure: KNEE ARTHROSCOPY WITH MEDIAL MENISCUS  ROOT REPAIR;  Surgeon: Leim Fabry, MD;  Location: [place];  Service: Orthopedics;  Laterality: Right;  MENSICUS ROOT GUIDE KIT + SWIVELOCKS,  SWIVELOCK TIBIAL FIXATION CETERIX OPEN KNEE TRAY BOVIE ELECTROCAUTERY  . POLYPECTOMY N/A 11/01/2018   Procedure: POLYPECTOMY;  Surgeon: [name], MD;  Location: [place];  Service: Endoscopy;  Laterality: N/A;  . TUBAL LIGATION    . VESTIBULAR NERVE SECTION Right 2017    Social History   Tobacco Use  . Smoking status: Never Smoker  . Smokeless tobacco: Never Used  Substance Use Topics  . Alcohol use: No    Alcohol/week: 0.0 standard drinks  . Drug use: No     Medication list has been reviewed and updated.  Current Meds  Medication Sig  . carbamazepine (TEGRETOL XR) 100 MG 12 hr tablet Take 1 tablet (100 mg total) by mouth 2 (two) times daily.    PHQ 2/9 Scores 02/25/2019 09/13/2018 09/11/2017 10/11/2016  PHQ - 2 Score 0 0 0 0  PHQ- 9 Score 1 - - -    BP Readings from Last 3 Encounters:  11/25/18 (!) 149/86  11/01/18 116/86  09/13/18 128/70    Physical Exam Vitals reviewed.  Pulmonary:     Effort: Pulmonary effort is normal.  Neurological:     Mental Status: She is alert.  Psychiatric:        Attention and Perception: Attention normal.        Mood and Affect: Mood normal.     Wt Readings from Last 3 Encounters:  02/25/19 191 lb (86.6 kg)  11/25/18 191 lb 12.8 oz (87 kg)  11/01/18 184 lb (83.5 kg)    Temp 98.7 F (37.1 C) (Oral)   Ht 5' 3"  (1.6 m)   Wt 191 lb (86.6 kg)   LMP 02/15/2019 (Exact Date)   BMI 33.83 kg/m   Assessment and Plan: 1. Shortness of breath May be mild Asthma recurring Will start Pulmicort bid every day; rinse mouth after use Follow up with me in one month - Budesonide (PULMICORT FLEXHALER) 90 MCG/ACT inhaler; Inhale 1 puff into the lungs 2 (two) times daily.   Dispense: 1 each; Refill: 1   Partially dictated using Editor, commissioning. Any errors are unintentional.  Halina Maidens, MD [facility]  02/25/2019

## 2019-04-01 ENCOUNTER — Encounter: Payer: Self-pay | Admitting: Internal Medicine

## 2019-04-01 ENCOUNTER — Ambulatory Visit (INDEPENDENT_AMBULATORY_CARE_PROVIDER_SITE_OTHER): Payer: Commercial Managed Care - PPO | Admitting: Internal Medicine

## 2019-04-01 ENCOUNTER — Other Ambulatory Visit: Payer: Self-pay

## 2019-04-01 VITALS — BP 130/78 | HR 80 | Ht 63.0 in | Wt 185.0 lb

## 2019-04-01 DIAGNOSIS — J4530 Mild persistent asthma, uncomplicated: Secondary | ICD-10-CM | POA: Diagnosis not present

## 2019-04-01 DIAGNOSIS — R252 Cramp and spasm: Secondary | ICD-10-CM

## 2019-04-01 MED ORDER — PULMICORT FLEXHALER 90 MCG/ACT IN AEPB: 1.0000 | INHALATION_SPRAY | Freq: Two times a day (BID) | RESPIRATORY_TRACT | 5 refills | Status: DC

## 2019-04-01 NOTE — Progress Notes (Signed)
Date:  04/01/2019   Name:  Theresa Lester   DOB:  10-10-1970   MRN:  [id number]   Chief Complaint: Asthma (Pulmicort follow up.)  Asthma There is no chest tightness, hoarse voice, shortness of breath or wheezing. This is a new problem. The problem occurs rarely. The problem has been rapidly improving (since starting Pulmicort). Associated symptoms include myalgias (muscle cramps with repetative activities). Pertinent negatives include no chest pain or fever. Her past medical history is significant for asthma.    Lab Results  Component Value Date   CREATININE 0.60 09/13/2018   BUN 10 09/13/2018   NA 140 09/13/2018   K 4.3 09/13/2018   CL 103 09/13/2018   CO2 24 09/13/2018   Lab Results  Component Value Date   CHOL 167 09/13/2018   HDL 63 09/13/2018   LDLCALC 69 09/13/2018   TRIG 177 (H) 09/13/2018   CHOLHDL 2.7 09/13/2018   Lab Results  Component Value Date   TSH 5.790 (H) 09/13/2018   No results found for: HGBA1C Lab Results  Component Value Date   WBC 7.9 09/13/2018   HGB 13.0 09/13/2018   HCT 38.3 09/13/2018   MCV 85 09/13/2018   PLT 261 09/13/2018   Lab Results  Component Value Date   ALT 17 09/13/2018   AST 17 09/13/2018   ALKPHOS 90 09/13/2018   BILITOT <0.2 09/13/2018    Review of Systems  Constitutional: Negative for fatigue and fever.  HENT: Negative for hoarse voice.   Respiratory: Negative for chest tightness, shortness of breath and wheezing.   Cardiovascular: Negative for chest pain and palpitations.  Musculoskeletal: Positive for myalgias (muscle cramps with repetative activities).    Patient Active Problem List   Diagnosis Date Noted  . Shortness of breath 02/25/2019  . Benign neoplasm of cecum   . Immunization, BCG 03/01/2018  . Tricompartment osteoarthritis of left knee 10/11/2016  . Hemifacial spasm   . TIA (transient ischemic attack) 06/11/2015  . Episodic tension-type headache, not intractable 04/14/2015  . Unilateral  vestibular schwannoma ([place]) 01/12/2015  . Angioneurotic edema 10/19/2014  . Calculus of gallbladder 10/19/2014  . Acid reflux 10/19/2014  . Hemorrhoids, internal 10/19/2014    No Known Allergies  Past Surgical History:  Procedure Laterality Date  . BILATERAL CARPAL TUNNEL RELEASE    . COLONOSCOPY WITH PROPOFOL N/A 11/01/2018   Procedure: COLONOSCOPY WITH BIOPSY;  Surgeon: [name], MD;  Location: [place];  Service: Endoscopy;  Laterality: N/A;  . KNEE ARTHROSCOPY WITH MEDIAL MENISECTOMY Left 01/18/2017   Procedure: KNEE ARTHROSCOPY WITH MEDIAL MENISECTOMY ROOT REPAIR CHONDROPLASTY;  Surgeon: Leim Fabry, MD;  Location: [place];  Service: Orthopedics;  Laterality: Left;  SUPINE WITH ACL LEG HOLDER ARTHREX ALEX  FLIP CUTTER, MENISCUS ROOT GUIDE STRYKER [place] CETERIX AIR  ANESTHESIA: ADDUCTOR CANAL NERVE BLOCK  . KNEE ARTHROSCOPY WITH MEDIAL MENISECTOMY Right 07/16/2018   Procedure: KNEE ARTHROSCOPY WITH MEDIAL MENISCUS  ROOT REPAIR;  Surgeon: Leim Fabry, MD;  Location: [place];  Service: Orthopedics;  Laterality: Right;  MENSICUS ROOT GUIDE KIT + SWIVELOCKS,  SWIVELOCK TIBIAL FIXATION CETERIX OPEN KNEE TRAY BOVIE ELECTROCAUTERY  . POLYPECTOMY N/A 11/01/2018   Procedure: POLYPECTOMY;  Surgeon: [name], MD;  Location: [place];  Service: Endoscopy;  Laterality: N/A;  . TUBAL LIGATION    . VESTIBULAR NERVE SECTION Right 2017    Social History   Tobacco Use  . Smoking status: Never Smoker  . Smokeless tobacco: Never Used  Substance Use Topics  . Alcohol use: No    Alcohol/week: 0.0 standard drinks  . Drug use: No     Medication list has been reviewed and updated.  Current Meds  Medication Sig  . Budesonide (PULMICORT FLEXHALER) 90 MCG/ACT inhaler Inhale 1 puff into the lungs 2 (two) times daily.  . carbamazepine (TEGRETOL XR) 100 MG 12 hr tablet Take 1 tablet (100 mg total) by mouth 2 (two) times daily. (Patient taking  differently: Take 100 mg by mouth daily. )    PHQ 2/9 Scores 04/01/2019 02/25/2019 09/13/2018 09/11/2017  PHQ - 2 Score 0 0 0 0  PHQ- 9 Score 0 1 - -    BP Readings from Last 3 Encounters:  04/01/19 130/78  11/25/18 (!) 149/86  11/01/18 116/86    Physical Exam Vitals and nursing note reviewed.  Constitutional:      General: She is not in acute distress.    Appearance: She is well-developed.  HENT:     Head: Normocephalic and atraumatic.  Cardiovascular:     Rate and Rhythm: Normal rate and regular rhythm.     Pulses: Normal pulses.  Pulmonary:     Effort: Pulmonary effort is normal. No respiratory distress.     Breath sounds: No wheezing or rhonchi.  Musculoskeletal:        General: Normal range of motion.  Skin:    General: Skin is warm and dry.     Findings: No rash.  Neurological:     Mental Status: She is alert and oriented to person, place, and time.  Psychiatric:        Behavior: Behavior normal.        Thought Content: Thought content normal.     Wt Readings from Last 3 Encounters:  04/01/19 185 lb (83.9 kg)  02/25/19 191 lb (86.6 kg)  11/25/18 191 lb 12.8 oz (87 kg)    BP 130/78   Pulse 80   Ht 5' 3"  (1.6 m)   Wt 185 lb (83.9 kg)   LMP 03/17/2019 (Exact Date)   SpO2 99%   BMI 32.77 kg/m   Assessment and Plan: 1. Mild persistent asthma without complication Doing very well on current medication Can try to reduce to one puff a day for maintenance - Budesonide (PULMICORT FLEXHALER) 90 MCG/ACT inhaler; Inhale 1 puff into the lungs 2 (two) times daily.  Dispense: 1 each; Refill: 5  2. Cramps, muscle, general Need to increase hydration to include electrolyte water If needed, can also add a daily Magnesium supplement   Partially dictated using Editor, commissioning. Any errors are unintentional.  Halina Maidens, MD [facility]  04/01/2019

## 2019-04-01 NOTE — Patient Instructions (Signed)
Covid Vaccine locations: [employer] [facility] [place]  [email or web address]  [email or web address]

## 2019-04-02 ENCOUNTER — Ambulatory Visit: Payer: Commercial Managed Care - PPO

## 2019-04-25 ENCOUNTER — Ambulatory Visit
Admission: RE | Admit: 2019-04-25 | Discharge: 2019-04-25 | Disposition: A | Discharge status: Home / Self Care | Payer: Commercial Managed Care - PPO | Source: Ambulatory Visit | Attending: Internal Medicine | Admitting: Internal Medicine

## 2019-04-25 DIAGNOSIS — Z1231 Encounter for screening mammogram for malignant neoplasm of breast: Secondary | ICD-10-CM | POA: Diagnosis present

## 2019-07-10 ENCOUNTER — Ambulatory Visit: Payer: Commercial Managed Care - PPO

## 2019-07-11 ENCOUNTER — Other Ambulatory Visit: Payer: Self-pay

## 2019-07-11 ENCOUNTER — Ambulatory Visit (INDEPENDENT_AMBULATORY_CARE_PROVIDER_SITE_OTHER): Payer: Commercial Managed Care - PPO

## 2019-07-11 DIAGNOSIS — Z23 Encounter for immunization: Secondary | ICD-10-CM

## 2019-09-15 ENCOUNTER — Encounter: Payer: Commercial Managed Care - PPO | Admitting: Internal Medicine

## 2019-09-15 ENCOUNTER — Other Ambulatory Visit: Payer: Self-pay | Admitting: Internal Medicine

## 2019-09-15 DIAGNOSIS — J4530 Mild persistent asthma, uncomplicated: Secondary | ICD-10-CM

## 2019-09-15 NOTE — Telephone Encounter (Signed)
Requested Prescriptions  Pending Prescriptions Disp Refills  . PULMICORT FLEXHALER 90 MCG/ACT inhaler [Pharmacy Med Name: PULMICORT 90 [place] 1 each 5    Sig: TAKE 1 PUFF BY MOUTH TWICE A DAY     Pulmonology:  Corticosteroids Passed - 09/15/2019 12:21 PM      Passed - Valid encounter within last 12 months    Recent Outpatient Visits          5 months ago Mild persistent asthma without complication   [facility] [name], MD   6 months ago Shortness of breath   [facility] [name], MD   1 year ago Annual physical exam   [facility] [name], MD   2 years ago Annual physical exam   [facility] [name], MD   2 years ago Left knee pain, unspecified chronicity   [facility] [name], MD      Future Appointments            In 2 months Army Melia Jesse Sans, MD [facility], [facility]

## 2019-10-04 IMAGING — MR MR KNEE*L* W/O CM
5 series · 40 of 40 positions shown · non-contrast
Comparison: Radiographs 10/11/2016

CLINICAL DATA: Progressive anterior knee pain with swelling for 1
month. No acute injury or prior relevant surgery

EXAM:
MRI OF THE LEFT KNEE WITHOUT CONTRAST
TECHNIQUE: Multiplanar, multisequence MR imaging of the knee was performed. No
intravenous contrast was administered.

[Series 3: PD fat-sat · axial · 3.0mm · 0.62mm/px · z∈[-71,+41]mm · 8 of 35 slices shown (1 of 3)]
[im 1/35]
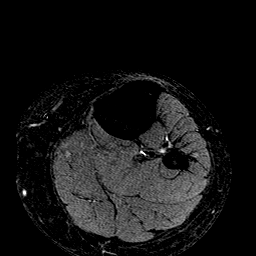
[im 5/35]
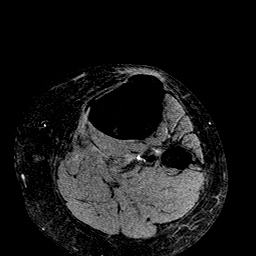
[im 10/35]
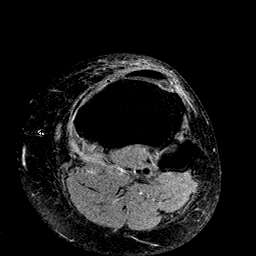
[im 15/35]
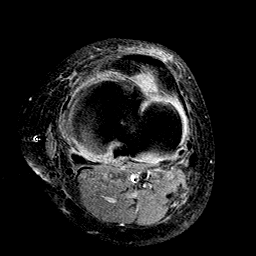
[im 20/35]
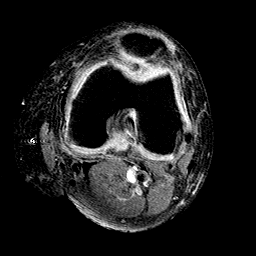
[im 25/35]
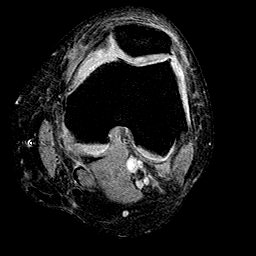
[im 30/35]
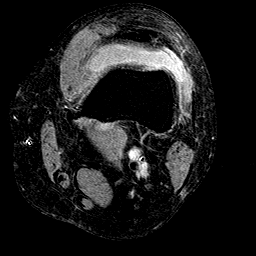
[im 35/35]
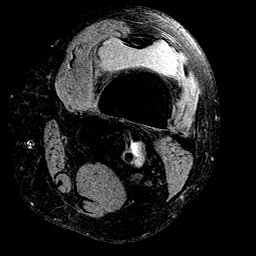

[Series 4: T2 fat-sat · coronal · 3.0mm · 0.62mm/px · 8 of 32 slices shown]
[im 1/32]
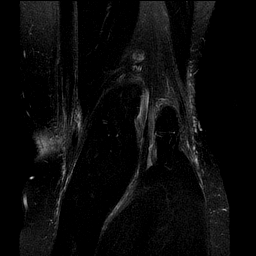
[im 5/32]
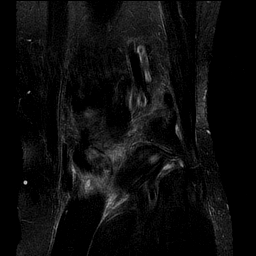
[im 9/32]
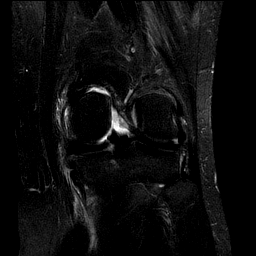
[im 14/32]
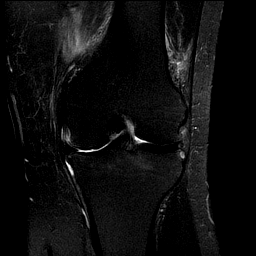
[im 18/32]
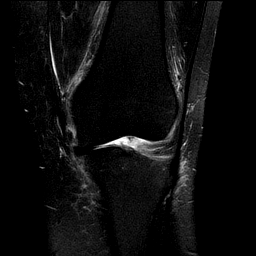
[im 23/32]
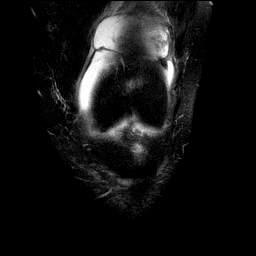
[im 27/32]
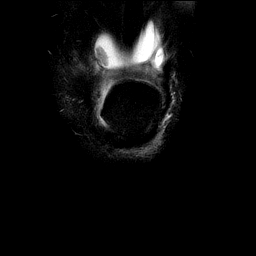
[im 32/32]
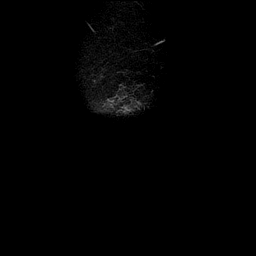

[Series 5: T1 · coronal · 3.0mm · 0.62mm/px · 8 of 32 slices shown]
[im 1/32]
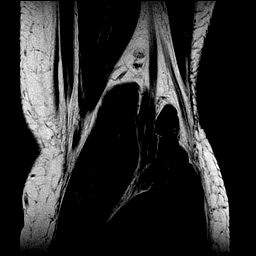
[im 5/32]
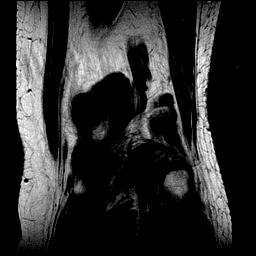
[im 9/32]
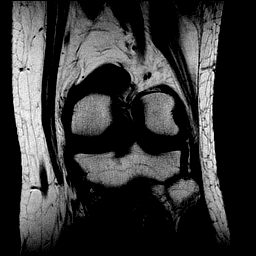
[im 14/32]
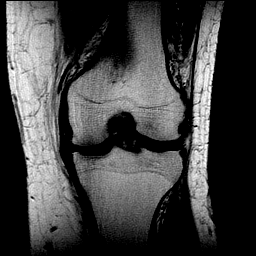
[im 18/32]
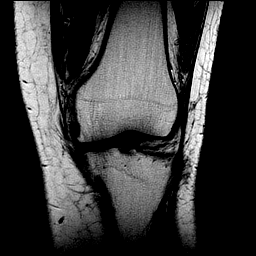
[im 23/32]
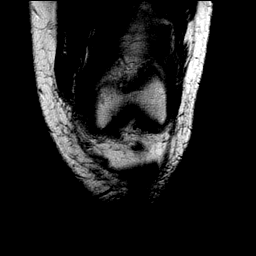
[im 27/32]
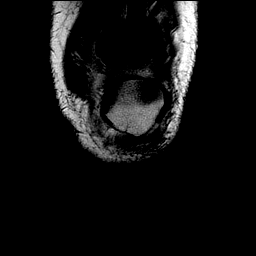
[im 32/32]
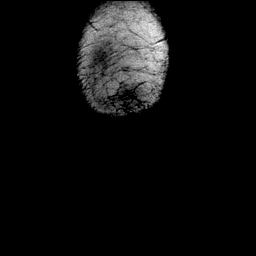

[Series 6: PD fat-sat · coronal · 3.0mm · 0.62mm/px · 8 of 32 slices shown (2 of 3)]
[im 1/32]
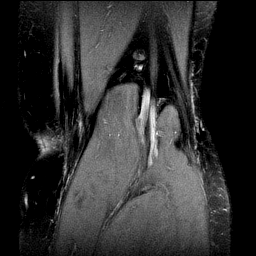
[im 5/32]
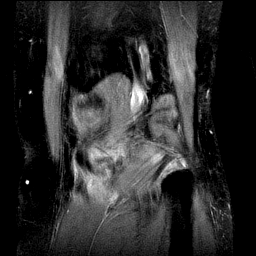
[im 9/32]
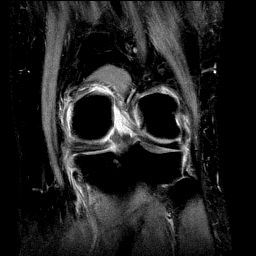
[im 14/32]
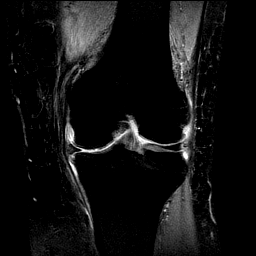
[im 18/32]
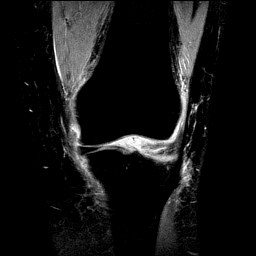
[im 23/32]
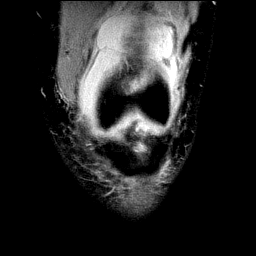
[im 27/32]
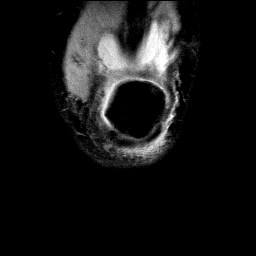
[im 32/32]
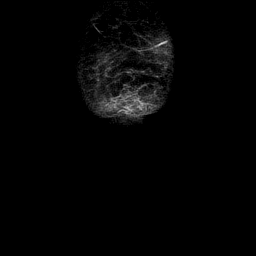

[Series 7: PD fat-sat · sagittal · 3.0mm · 0.62mm/px · 8 of 35 slices shown (3 of 3)]
[im 1/35]
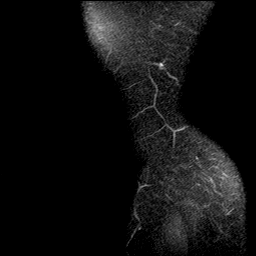
[im 5/35]
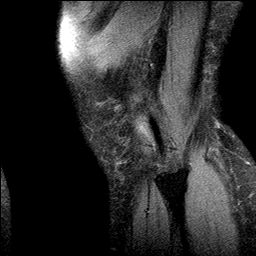
[im 10/35]
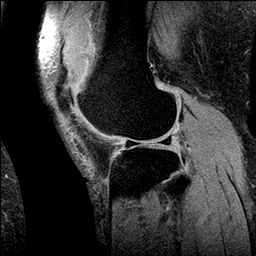
[im 15/35]
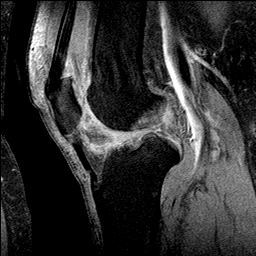
[im 20/35]
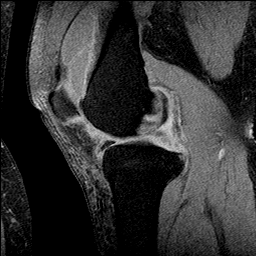
[im 25/35]
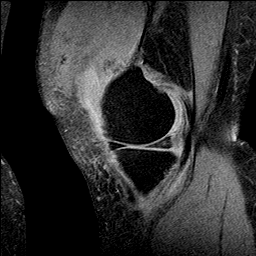
[im 30/35]
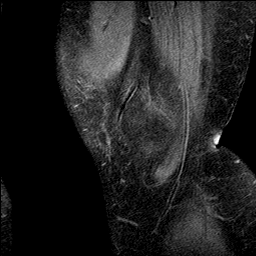
[im 35/35]
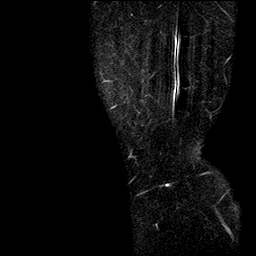

[40 of 40 positions shown; findings below may reference images not displayed]

FINDINGS: MENISCI

Medial meniscus: There is a radial tear of the posterior horn of the
medial meniscus at the meniscal root with resulting incomplete
meniscal detachment. There is minimal resulting extrusion of the
meniscus from the joint. No displaced meniscal fragment.

Lateral meniscus:  Intact with normal morphology.

LIGAMENTS

Cruciates:  Intact.

Collaterals:  Intact with mild MCL degeneration.

CARTILAGE

Patellofemoral: Patellar cartilage evaluation limited by mild motion
on the axial images. There is mild patellofemoral chondral thinning
and surface irregularity without subchondral cyst formation.

Medial:  Mild chondral thinning and surface irregularity.

Lateral:  Preserved.

MISCELLANEOUS

Joint: Moderate size, complex appearing joint effusion, likely
synovitis. No discrete loose bodies.

Popliteal Fossa:  Unremarkable. No significant Baker's cyst.

Extensor Mechanism: Intact. There is moderate spurring at the
quadriceps insertion on the patella.

Bones:  No acute or significant extra-articular osseous findings.

Other: Mild subcutaneous edema, greatest anteriorly.
IMPRESSION: 1. Degenerative root tear of the posterior horn of the medial
meniscus with resulting incomplete meniscal detachment.
2. Underlying mild medial and patellofemoral compartment
degenerative changes. No acute osseous findings.
3. Moderate-sized joint effusion with evidence of synovitis.
4. Intact lateral meniscus, cruciate and collateral ligaments.

## 2019-10-14 ENCOUNTER — Ambulatory Visit (INDEPENDENT_AMBULATORY_CARE_PROVIDER_SITE_OTHER): Payer: Commercial Managed Care - PPO

## 2019-10-14 ENCOUNTER — Other Ambulatory Visit: Payer: Self-pay

## 2019-10-14 DIAGNOSIS — Z23 Encounter for immunization: Secondary | ICD-10-CM

## 2019-10-17 ENCOUNTER — Ambulatory Visit: Payer: Commercial Managed Care - PPO

## 2019-11-03 ENCOUNTER — Ambulatory Visit (INDEPENDENT_AMBULATORY_CARE_PROVIDER_SITE_OTHER): Payer: Commercial Managed Care - PPO | Admitting: Internal Medicine

## 2019-11-03 ENCOUNTER — Encounter: Payer: Self-pay | Admitting: Internal Medicine

## 2019-11-03 ENCOUNTER — Other Ambulatory Visit: Payer: Self-pay

## 2019-11-03 VITALS — BP 124/72 | HR 85 | Temp 98.5°F | Ht 63.0 in | Wt 186.0 lb

## 2019-11-03 DIAGNOSIS — Z23 Encounter for immunization: Secondary | ICD-10-CM | POA: Diagnosis not present

## 2019-11-03 DIAGNOSIS — K645 Perianal venous thrombosis: Secondary | ICD-10-CM

## 2019-11-03 MED ORDER — HYDROCORTISONE ACE-PRAMOXINE 1-1 % EX CREA: 1.0000 "application " | TOPICAL_CREAM | Freq: Two times a day (BID) | CUTANEOUS | 0 refills | Status: DC

## 2019-11-03 NOTE — Progress Notes (Signed)
Date:  11/03/2019   Name:  Theresa Lester   DOB:  [birth year]   MRN:  [id number]   Chief Complaint: Hemorrhoids (Flare up started a week ago. Hemmorrhoids are painful, and they continue to swell. Wants to know if they can be removed. Trying preperation H and salt baths. )  Rectal Bleeding  The current episode started 5 to 7 days ago. The onset was sudden. The problem has been gradually worsening. The pain is moderate. The stool is described as soft. Associated symptoms include hemorrhoids, rectal pain and coughing (with phlegm). Pertinent negatives include no fever, no diarrhea and no chest pain.    Lab Results  Component Value Date   CREATININE 0.60 09/13/2018   BUN 10 09/13/2018   NA 140 09/13/2018   K 4.3 09/13/2018   CL 103 09/13/2018   CO2 24 09/13/2018   Lab Results  Component Value Date   CHOL 167 09/13/2018   HDL 63 09/13/2018   LDLCALC 69 09/13/2018   TRIG 177 (H) 09/13/2018   CHOLHDL 2.7 09/13/2018   Lab Results  Component Value Date   TSH 5.790 (H) 09/13/2018   No results found for: HGBA1C Lab Results  Component Value Date   WBC 7.9 09/13/2018   HGB 13.0 09/13/2018   HCT 38.3 09/13/2018   MCV 85 09/13/2018   PLT 261 09/13/2018   Lab Results  Component Value Date   ALT 17 09/13/2018   AST 17 09/13/2018   ALKPHOS 90 09/13/2018   BILITOT <0.2 09/13/2018     Review of Systems  Constitutional: Negative for chills, fatigue and fever.  Respiratory: Positive for cough (with phlegm). Negative for chest tightness.   Cardiovascular: Negative for chest pain and leg swelling.  Gastrointestinal: Positive for anal bleeding, hematochezia, hemorrhoids and rectal pain. Negative for constipation and diarrhea.    Patient Active Problem List   Diagnosis Date Noted  . Asthma in adult without complication 30/09/2328  . Benign neoplasm of cecum   . Immunization, BCG 03/01/2018  . Tricompartment osteoarthritis of left knee 10/11/2016  . Hemifacial spasm     . TIA (transient ischemic attack) 06/11/2015  . Episodic tension-type headache, not intractable 04/14/2015  . Unilateral vestibular schwannoma ([place]) 01/12/2015  . Angioneurotic edema 10/19/2014  . Calculus of gallbladder 10/19/2014  . Acid reflux 10/19/2014  . Hemorrhoids, internal 10/19/2014    No Known Allergies  Past Surgical History:  Procedure Laterality Date  . BILATERAL CARPAL TUNNEL RELEASE    . COLONOSCOPY WITH PROPOFOL N/A 11/01/2018   Procedure: COLONOSCOPY WITH BIOPSY;  Surgeon: [name], MD;  Location: [place];  Service: Endoscopy;  Laterality: N/A;  . KNEE ARTHROSCOPY WITH MEDIAL MENISECTOMY Left 01/18/2017   Procedure: KNEE ARTHROSCOPY WITH MEDIAL MENISECTOMY ROOT REPAIR CHONDROPLASTY;  Surgeon: Leim Fabry, MD;  Location: [place];  Service: Orthopedics;  Laterality: Left;  SUPINE WITH ACL LEG HOLDER ARTHREX ALEX  FLIP CUTTER, MENISCUS ROOT GUIDE STRYKER [place] CETERIX AIR  ANESTHESIA: ADDUCTOR CANAL NERVE BLOCK  . KNEE ARTHROSCOPY WITH MEDIAL MENISECTOMY Right 07/16/2018   Procedure: KNEE ARTHROSCOPY WITH MEDIAL MENISCUS  ROOT REPAIR;  Surgeon: Leim Fabry, MD;  Location: [place];  Service: Orthopedics;  Laterality: Right;  MENSICUS ROOT GUIDE KIT + SWIVELOCKS,  SWIVELOCK TIBIAL FIXATION CETERIX OPEN KNEE TRAY BOVIE ELECTROCAUTERY  . POLYPECTOMY N/A 11/01/2018   Procedure: POLYPECTOMY;  Surgeon: [name], MD;  Location: [place];  Service: Endoscopy;  Laterality: N/A;  . TUBAL LIGATION    .  VESTIBULAR NERVE SECTION Right 2017    Social History   Tobacco Use  . Smoking status: Never Smoker  . Smokeless tobacco: Never Used  Vaping Use  . Vaping Use: Never used  Substance Use Topics  . Alcohol use: No    Alcohol/week: 0.0 standard drinks  . Drug use: No     Medication list has been reviewed and updated.  Current Meds  Medication Sig  . carbamazepine (TEGRETOL XR) 100 MG 12 hr tablet Take 100 mg by  mouth 2 (two) times daily as needed.  Marland Kitchen ibuprofen (ADVIL) 800 MG tablet Take 600 mg by mouth daily.  Marland Kitchen PULMICORT FLEXHALER 90 MCG/ACT inhaler TAKE 1 PUFF BY MOUTH TWICE A DAY    PHQ 2/9 Scores 11/03/2019 04/01/2019 02/25/2019 09/13/2018  PHQ - 2 Score 0 0 0 0  PHQ- 9 Score 0 0 1 -    GAD 7 : Generalized Anxiety Score 11/03/2019  Nervous, Anxious, on Edge 0  Control/stop worrying 0  Worry too much - different things 0  Trouble relaxing 0  Restless 0  Easily annoyed or irritable 0  Afraid - awful might happen 0  Total GAD 7 Score 0  Anxiety Difficulty Not difficult at all    BP Readings from Last 3 Encounters:  11/03/19 124/72  04/01/19 130/78  11/25/18 (!) 149/86    Physical Exam Vitals and nursing note reviewed.  Constitutional:      General: She is not in acute distress.    Appearance: She is well-developed.  HENT:     Head: Normocephalic and atraumatic.  Pulmonary:     Effort: Pulmonary effort is normal. No respiratory distress.  Genitourinary:    Rectum: External hemorrhoid (3 mm at 3 oclock - firm and tender) present.  Musculoskeletal:        General: Normal range of motion.  Skin:    General: Skin is warm and dry.     Findings: No rash.  Neurological:     Mental Status: She is alert and oriented to person, place, and time.  Psychiatric:        Behavior: Behavior normal.        Thought Content: Thought content normal.     Wt Readings from Last 3 Encounters:  11/03/19 186 lb (84.4 kg)  04/01/19 185 lb (83.9 kg)  02/25/19 191 lb (86.6 kg)    BP 124/72   Pulse 85   Temp 98.5 F (36.9 C) (Oral)   Ht 5' 3"  (1.6 m)   Wt 186 lb (84.4 kg)   LMP 10/15/2019 (Exact Date)   SpO2 100%   BMI 32.95 kg/m   Assessment and Plan: 1. External hemorrhoid, thrombosed Continue local care  Topical cream prescribed until she can have definitive treatment - Ambulatory referral to General Surgery - pramoxine-hydrocortisone (PROCTOCREAM-HC) 1-1 % rectal cream; Place 1  application rectally 2 (two) times daily.  Dispense: 30 g; Refill: 0  2. Need for immunization against influenza - Flu Vaccine QUAD 36+ mos IM  3. Unilateral vestibular schwannoma ([place]) Resolved after surgery   Partially dictated using Dragon software. Any errors are unintentional.  Halina Maidens, MD [facility]  11/03/2019

## 2019-11-03 NOTE — Patient Instructions (Signed)
Mucinex -plain twice a day to thin mucous

## 2019-11-06 ENCOUNTER — Ambulatory Visit (INDEPENDENT_AMBULATORY_CARE_PROVIDER_SITE_OTHER): Payer: Commercial Managed Care - PPO | Admitting: General Surgery

## 2019-11-06 ENCOUNTER — Other Ambulatory Visit: Payer: Self-pay

## 2019-11-06 ENCOUNTER — Ambulatory Visit: Payer: Self-pay

## 2019-11-06 ENCOUNTER — Encounter: Payer: Self-pay | Admitting: General Surgery

## 2019-11-06 VITALS — BP 131/86 | HR 105 | Temp 98.6°F | Resp 12 | Ht 63.0 in | Wt 186.0 lb

## 2019-11-06 DIAGNOSIS — E041 Nontoxic single thyroid nodule: Secondary | ICD-10-CM

## 2019-11-06 MED ORDER — LIDOCAINE (ANORECTAL) 5 % EX CREA: 1.0000 "application " | TOPICAL_CREAM | CUTANEOUS | 0 refills | Status: DC | PRN

## 2019-11-06 NOTE — Progress Notes (Signed)
Patient ID: Theresa Lester, female   DOB: 07/29/1970, 49 y.o.   MRN: [id number]  Chief Complaint  Patient presents with  . New Patient (Initial Visit)    NP ref: Dr. Army Melia, hemorroids    HPI Theresa Lester is a 49 y.o. female.   She has been referred by her primary care provider for further evaluation of hemorrhoids. Theresa Lester states that she has had hemorrhoids for at least 15 years, dating back to when she was pregnant with her daughter. She says about 4 days ago, she had some bright red blood on her toilet tissue and exquisite pain in the perianal area. She says that she then noticed a lump or swelling at the anal opening. She denies constipation or diarrhea and has regular, formed soft stools. She has not noticed any blood in the toilet nor any blood in her stools. She endorses pain and burning without itching. She has been using Preparation H at home as well as taking sitz baths with Epsom salts. She saw Dr. Army Melia on Monday, October 18. From there, she was referred to general surgery for further evaluation and management. She was prescribed ProctoCream by Dr. Army Melia. She denies any family history of colon cancer and underwent colonoscopy in October 2020. I reviewed the procedure report and no commentary was made regarding the presence of hemorrhoids, internal or external. She did have a 3 mm polyp removed from the cecum. Pathology was benign.   Past Medical History:  Diagnosis Date  . Acoustic neuroma ([place])   . Arthralgia of hip or thigh 10/19/2014  . Blood glucose elevated 10/19/2014   A1C 5.7 [08/2013]   . Calculus of gallbladder   . Encounter for screening colonoscopy   . Fast heart beat 10/19/2014   TFTs normal [02/2014]   . GERD (gastroesophageal reflux disease)   . Hemorrhoids, internal   . Left axillary hidradenitis 11/05/2015  . Restless leg   . Tinnitus 11/25/2014   Patient Active Problem List   Diagnosis Date Noted  . Asthma in adult without  complication 16/96/7893  . Benign neoplasm of cecum   . Immunization, BCG 03/01/2018  . Tricompartment osteoarthritis of left knee 10/11/2016  . Hemifacial spasm   . TIA (transient ischemic attack) 06/11/2015  . Episodic tension-type headache, not intractable 04/14/2015  . Angioneurotic edema 10/19/2014  . Calculus of gallbladder 10/19/2014  . Acid reflux 10/19/2014  . Hemorrhoids, internal 10/19/2014     Past Surgical History:  Procedure Laterality Date  . BILATERAL CARPAL TUNNEL RELEASE    . COLONOSCOPY WITH PROPOFOL N/A 11/01/2018   Procedure: COLONOSCOPY WITH BIOPSY;  Surgeon: Theresa Lame, MD;  Location: [facility];  Service: Endoscopy;  Laterality: N/A;  . KNEE ARTHROSCOPY WITH MEDIAL MENISECTOMY Left 01/18/2017   Procedure: KNEE ARTHROSCOPY WITH MEDIAL MENISECTOMY ROOT REPAIR CHONDROPLASTY;  Surgeon: Leim Fabry, MD;  Location: [place];  Service: Orthopedics;  Laterality: Left;  SUPINE WITH ACL LEG HOLDER ARTHREX ALEX  FLIP CUTTER, MENISCUS ROOT GUIDE STRYKER [place] CETERIX AIR  ANESTHESIA: ADDUCTOR CANAL NERVE BLOCK  . KNEE ARTHROSCOPY WITH MEDIAL MENISECTOMY Right 07/16/2018   Procedure: KNEE ARTHROSCOPY WITH MEDIAL MENISCUS  ROOT REPAIR;  Surgeon: Leim Fabry, MD;  Location: [place];  Service: Orthopedics;  Laterality: Right;  MENSICUS ROOT GUIDE KIT + SWIVELOCKS,  SWIVELOCK TIBIAL FIXATION CETERIX OPEN KNEE TRAY BOVIE ELECTROCAUTERY  . POLYPECTOMY N/A 11/01/2018   Procedure: POLYPECTOMY;  Surgeon: Theresa Lame, MD;  Location: [place];  Service: Endoscopy;  Laterality: N/A;  . TUBAL LIGATION    . VESTIBULAR NERVE SECTION Right 2017    Family History  Problem Relation Age of Onset  . Hypertension Mother   . Diabetes Mother   . Breast cancer Neg Hx     Social History Social History   Tobacco Use  . Smoking status: Never Smoker  . Smokeless tobacco: Never Used  Vaping Use  . Vaping Use: Never used  Substance Use Topics   . Alcohol use: No    Alcohol/week: 0.0 standard drinks  . Drug use: No    No Known Allergies  Current Outpatient Medications  Medication Sig Dispense Refill  . carbamazepine (TEGRETOL XR) 100 MG 12 hr tablet Take 100 mg by mouth 2 (two) times daily as needed.    Marland Kitchen ibuprofen (ADVIL) 800 MG tablet Take 600 mg by mouth daily.    . pramoxine-hydrocortisone (PROCTOCREAM-HC) 1-1 % rectal cream Place 1 application rectally 2 (two) times daily. 30 g 0  . PULMICORT FLEXHALER 90 MCG/ACT inhaler TAKE 1 PUFF BY MOUTH TWICE A DAY 1 each 5  . Lidocaine, Anorectal, 5 % CREA Apply 1 application topically as needed. 15 g 0   No current facility-administered medications for this visit.    Review of Systems Review of Systems  All other systems reviewed and are negative. Or as discussed in the history of present illness.  Blood pressure 131/86, pulse (!) 105, temperature 98.6 F (37 C), temperature source Oral, resp. rate 12, height 5' 3"  (1.6 m), weight 186 lb (84.4 kg), last menstrual period 10/15/2019, SpO2 99 %. Body mass index is 32.95 kg/m.  Physical Exam Physical Exam Vitals reviewed. Exam conducted with a chaperone present.  Constitutional:      General: She is not in acute distress.    Appearance: Normal appearance. She is obese.  HENT:     Head: Normocephalic and atraumatic.     Nose:     Comments: Covered with a mask    Mouth/Throat:     Comments: Covered with a mask Eyes:     General: No scleral icterus.       Right eye: No discharge.        Left eye: No discharge.     Comments: No proptosis or exophthalmos.  Neck:     Comments: No palpable cervical or supraclavicular lymphadenopathy.  The trachea is midline.  On palpation, it feels that she has bilateral thyroid nodules.  The texture is somewhat rubbery and they are nontender.  The thyroid gland moves freely with deglutition. Cardiovascular:     Pulses: Normal pulses.     Comments: Although she was tachycardic when she was  evaluated on arrival to clinic, she has a regular rate and rhythm on my exam. Pulmonary:     Effort: Pulmonary effort is normal.     Breath sounds: Normal breath sounds.  Abdominal:     General: Bowel sounds are normal.     Palpations: Abdomen is soft.  Genitourinary:      Comments: She has a thrombosed external hemorrhoid with additional small, nonthrombosed, grade 1 hemorrhoids. Musculoskeletal:        General: No swelling or tenderness.  Skin:    General: Skin is warm and dry.  Neurological:     General: No focal deficit present.     Mental Status: She is alert and oriented to person, place, and time.  Psychiatric:        Mood and Affect: Mood normal.  Behavior: Behavior normal.     Data ReviewedResults for VALORA, NORELL (MRN [id number]) as of 11/06/2019 12:22  Ref. Range 09/13/2018 11:15  TSH Latest Ref Range: 0.450 - 4.500 uIU/mL 5.790 (H)  She is mildly hypothyroid.  It does not appear that she is currently on any thyroid hormone replacement.  She had a carotid ultrasound in 2017.  I reviewed the report.  There is no comment made regarding any thyroid nodules.  I personally reviewed the images and on a few of the stills, I see a left thyroid nodule.  I reviewed Dr. Gaspar Cola clinic note from October 18.  Based upon her description, it appears that the thrombosed hemorrhoid was present on physical exam at that time.  Assessment This is a 49 year old woman who has had hemorrhoids for a number of years, however had recent (4 days ago) onset of severe pain with some associated bleeding.  On physical examination, she does have a thrombosed external hemorrhoid.  I also identified palpable thyroid nodules bilaterally.  She denies any symptoms, but she is mildly hypothyroid on labs performed in August 2020.  Plan Unfortunately, due to the duration of time since her hemorrhoid symptoms began, evacuation of the hemorrhoidal hematoma is not indicated.  I discussed  this with her and let her know that there was no surgical option available at this time.  Unfortunately, the best we can do is symptomatic management while her body breaks down and reabsorb the clot.  I encouraged her to continue to use the cream prescribed by Dr. Army Melia, sitz bath's, and I also prescribed a topical  lidocaine ointment that she may use to help with symptomatic relief.  I also counseled her to avoid constipation and that if she ever has similar symptoms in the future, she should contact our office immediately, as it is important to evacuate these within a maximum of 72 hours, preferably within the first 24 to 48 hours.  I also performed a bedside ultrasound of her thyroid.  She does have bilateral thyroid nodules.  Neither of these have a particularly concerning appearance and I did not think biopsy was warranted today.  I would like to see her back in 6 months time for surveillance ultrasound.  I will also recheck her thyroid levels at that time to monitor for any progression of her hypothyroidism.  In the meantime, she may benefit from low-dose levothyroxine supplementation to try and get her TSH below 4.    Fredirick Maudlin 11/06/2019, 12:19 PM

## 2019-11-06 NOTE — Patient Instructions (Addendum)
Please pick up your medication at the pharmacy. Please see information on Thrombosed Hemorrhoids I have printed out for you today.   Hemorrhoids Hemorrhoids are swollen veins that may develop:  In the butt (rectum). These are called internal hemorrhoids.  Around the opening of the butt (anus). These are called external hemorrhoids. Hemorrhoids can cause pain, itching, or bleeding. Most of the time, they do not cause serious problems. They usually get better with diet changes, lifestyle changes, and other home treatments. What are the causes? This condition may be caused by:  Having trouble pooping (constipation).      High-Fiber Diet Fiber, also called dietary fiber, is a type of carbohydrate that is found in fruits, vegetables, whole grains, and beans. A high-fiber diet can have many health benefits. Your health care provider may recommend a high-fiber diet to help: Prevent constipation. Fiber can make your bowel movements more regular. Lower your cholesterol. Relieve the following conditions: Swelling of veins in the anus (hemorrhoids). Swelling and irritation (inflammation) of specific areas of the digestive tract (uncomplicated diverticulosis). A problem of the large intestine (colon) that sometimes causes pain and diarrhea (irritable bowel syndrome, IBS). Prevent overeating as part of a weight-loss plan. Prevent heart disease, type 2 diabetes, and certain cancers. What is my plan? The recommended daily fiber intake in grams (g) includes: 38 g for men age 68 or younger. 30 g for men over age 3. 45 g for women age 6 or younger. 21 g for women over age 56. You can get the recommended daily intake of dietary fiber by: Eating a variety of fruits, vegetables, grains, and beans. Taking a fiber supplement, if it is not possible to get enough fiber through your diet. What do I need to know about a high-fiber diet? It is better to get fiber through food sources rather than from  fiber supplements. There is not a lot of research about how effective supplements are. Always check the fiber content on the nutrition facts label of any prepackaged food. Look for foods that contain 5 g of fiber or more per serving. Talk with a diet and nutrition specialist (dietitian) if you have questions about specific foods that are recommended or not recommended for your medical condition, especially if those foods are not listed below. Gradually increase how much fiber you consume. If you increase your intake of dietary fiber too quickly, you may have bloating, cramping, or gas. Drink plenty of water. Water helps you to digest fiber. What are tips for following this plan? Eat a wide variety of high-fiber foods. Make sure that half of the grains that you eat each day are whole grains. Eat breads and cereals that are made with whole-grain flour instead of refined flour or white flour. Eat brown rice, bulgur wheat, or millet instead of white rice. Start the day with a breakfast that is high in fiber, such as a cereal that contains 5 g of fiber or more per serving. Use beans in place of meat in soups, salads, and pasta dishes. Eat high-fiber snacks, such as berries, raw vegetables, nuts, and popcorn. Choose whole fruits and vegetables instead of processed forms like juice or sauce. What foods can I eat?  Fruits Berries. Pears. Apples. Oranges. Avocado. Prunes and raisins. Dried figs. Vegetables Sweet potatoes. Spinach. Kale. Artichokes. Cabbage. Broccoli. Cauliflower. Green peas. Carrots. Squash. Grains Whole-grain breads. Multigrain cereal. Oats and oatmeal. Brown rice. Barley. Bulgur wheat. [place]. Quinoa. Bran muffins. Popcorn. Rye wafer crackers. Meats and other proteins [employer],  kidney, and pinto beans. Soybeans. Split peas. Lentils. Nuts and seeds. Dairy Fiber-fortified yogurt. Beverages Fiber-fortified soy milk. Fiber-fortified orange juice. Other foods Fiber bars. The items  listed above may not be a complete list of recommended foods and beverages. Contact a dietitian for more options. What foods are not recommended? Fruits Fruit juice. Cooked, strained fruit. Vegetables Fried potatoes. Canned vegetables. Well-cooked vegetables. Grains White bread. Pasta made with refined flour. White rice. Meats and other proteins Fatty cuts of meat. Fried chicken or fried fish. Dairy Milk. Yogurt. Cream cheese. Sour cream. Fats and oils Butters. Beverages Soft drinks. Other foods Cakes and pastries. The items listed above may not be a complete list of foods and beverages to avoid. Contact a dietitian for more information. Summary Fiber is a type of carbohydrate. It is found in fruits, vegetables, whole grains, and beans. There are many health benefits of eating a high-fiber diet, such as preventing constipation, lowering blood cholesterol, helping with weight loss, and reducing your risk of heart disease, diabetes, and certain cancers. Gradually increase your intake of fiber. Increasing too fast can result in cramping, bloating, and gas. Drink plenty of water while you increase your fiber. The best sources of fiber include whole fruits and vegetables, whole grains, nuts, seeds, and beans. This information is not intended to replace advice given to you by your health care provider. Make sure you discuss any questions you have with your health care provider. Document Revised: 11/06/2016 Document Reviewed: 11/06/2016 Elsevier Patient Education  [place] hard (straining) to poop.  Watery poop (diarrhea).  Pregnancy.  Being very overweight (obese).  Sitting for long periods of time.  Heavy lifting or other activity that causes you to strain.  Anal sex.  Riding a bike for a long period of time. What are the signs or symptoms? Symptoms of this condition include:  Pain.  Itching or soreness in the butt.  Bleeding from the butt.  Leaking  poop.  Swelling in the area.  One or more lumps around the opening of your butt. How is this diagnosed? A doctor can often diagnose this condition by looking at the affected area. The doctor may also:  Do an exam that involves feeling the area with a gloved hand (digital rectal exam).  Examine the area inside your butt using a small tube (anoscope).  Order blood tests. This may be done if you have lost a lot of blood.  Have you get a test that involves looking inside the colon using a flexible tube with a camera on the end (sigmoidoscopy or colonoscopy). How is this treated? This condition can usually be treated at home. Your doctor may tell you to change what you eat, make lifestyle changes, or try home treatments. If these do not help, procedures can be done to remove the hemorrhoids or make them smaller. These may involve:  Placing rubber bands at the base of the hemorrhoids to cut off their blood supply.  Injecting medicine into the hemorrhoids to shrink them.  Shining a type of light energy onto the hemorrhoids to cause them to fall off.  Doing surgery to remove the hemorrhoids or cut off their blood supply. Follow these instructions at home: Eating and drinking   Eat foods that have a lot of fiber in them. These include whole grains, beans, nuts, fruits, and vegetables.  Ask your doctor about taking products that have added fiber (fibersupplements).  Reduce the amount of fat in your diet. You can do  this by: ? Eating low-fat dairy products. ? Eating less red meat. ? Avoiding processed foods.  Drink enough fluid to keep your pee (urine) pale yellow. Managing pain and swelling   Take a warm-water bath (sitz bath) for 20 minutes to ease pain. Do this 3-4 times a day. You may do this in a bathtub or using a portable sitz bath that fits over the toilet.  If told, put ice on the painful area. It may be helpful to use ice between your warm baths. ? Put ice in a plastic  bag. ? Place a towel between your skin and the bag. ? Leave the ice on for 20 minutes, 2-3 times a day. General instructions  Take over-the-counter and prescription medicines only as told by your doctor. ? Medicated creams and medicines may be used as told.  Exercise often. Ask your doctor how much and what kind of exercise is best for you.  Go to the bathroom when you have the urge to poop. Do not wait.  Avoid pushing too hard when you poop.  Keep your butt dry and clean. Use wet toilet paper or moist towelettes after pooping.  Do not sit on the toilet for a long time.  Keep all follow-up visits as told by your doctor. This is important. Contact a doctor if you:  Have pain and swelling that do not get better with treatment or medicine.  Have trouble pooping.  Cannot poop.  Have pain or swelling outside the area of the hemorrhoids. Get help right away if you have:  Bleeding that will not stop. Summary  Hemorrhoids are swollen veins in the butt or around the opening of the butt.  They can cause pain, itching, or bleeding.  Eat foods that have a lot of fiber in them. These include whole grains, beans, nuts, fruits, and vegetables.  Take a warm-water bath (sitz bath) for 20 minutes to ease pain. Do this 3-4 times a day. This information is not intended to replace advice given to you by your health care provider. Make sure you discuss any questions you have with your health care provider. Document Revised: 01/10/2018 Document Reviewed: 05/24/2017 Elsevier Patient Education  [place].  How to Take a [employer] A sitz bath is a warm water bath that may be used to care for your rectum, genital area, or the area between your rectum and genitals (perineum). For a sitz bath, the water only comes up to your hips and covers your buttocks. A sitz bath may done at home in a bathtub or with a portable sitz bath that fits over the toilet. Your health care provider may recommend  a sitz bath to help:  Relieve pain and discomfort after delivering a baby.  Relieve pain and itching from hemorrhoids or anal fissures.  Relieve pain after certain surgeries.  Relax muscles that are sore or tight. How to take a sitz bath Take 3-4 sitz baths a day, or as many as told by your health care provider. Bathtub sitz bath To take a sitz bath in a bathtub: 1. Partially fill a bathtub with warm water. The water should be deep enough to cover your hips and buttocks when you are sitting in the tub. 2. If your health care provider told you to put medicine in the water, follow his or her instructions. 3. Sit in the water. 4. Open the tub drain a little, and leave it open during your bath. 5. Turn on the warm water again,  enough to replace the water that is draining out. Keep the water running throughout your bath. This helps keep the water at the right level and the right temperature. 6. Soak in the water for 15-20 minutes, or as long as told by your health care provider. 7. When you are done, be careful when you stand up. You may feel dizzy. 8. After the sitz bath, pat yourself dry. Do not rub your skin to dry it.  Over-the-toilet sitz bath To take a sitz bath with an over-the-toilet basin: 1. Follow the manufacturer's instructions. 2. Fill the basin with warm water. 3. If your health care provider told you to put medicine in the water, follow his or her instructions. 4. Sit on the seat. Make sure the water covers your buttocks and perineum. 5. Soak in the water for 15-20 minutes, or as long as told by your health care provider. 6. After the sitz bath, pat yourself dry. Do not rub your skin to dry it. 7. Clean and dry the basin between uses. 8. Discard the basin if it cracks, or according to the manufacturer's instructions. Contact a health care provider if:  Your symptoms get worse. Do not continue with sitz baths if your symptoms get worse.  You have new symptoms. If this  happens, do not continue with sitz baths until you talk with your health care provider. Summary  A sitz bath is a warm water bath in which the water only comes up to your hips and covers your buttocks.  A sitz bath may help relieve itching, relieve pain, and relax muscles that are sore or tight in the lower part of your body, including your genital area.  Take 3-4 sitz baths a day, or as many as told by your health care provider. Soak in the water for 15-20 minutes.  Do not continue with sitz baths if your symptoms get worse. This information is not intended to replace advice given to you by your health care provider. Make sure you discuss any questions you have with your health care provider. Document Revised: 06/03/2018 Document Reviewed: 01/04/2017 Elsevier Patient Education  [place].

## 2019-11-12 ENCOUNTER — Ambulatory Visit: Payer: Self-pay | Admitting: Surgery

## 2019-11-21 ENCOUNTER — Ambulatory Visit (HOSPITAL_COMMUNITY)
Admission: RE | Admit: 2019-11-21 | Discharge: 2019-11-21 | Disposition: A | Discharge status: Home / Self Care | Payer: Commercial Managed Care - PPO | Source: Ambulatory Visit | Attending: Internal Medicine | Admitting: Internal Medicine

## 2019-11-21 ENCOUNTER — Inpatient Hospital Stay: Payer: Commercial Managed Care - PPO | Attending: Internal Medicine | Admitting: Internal Medicine

## 2019-11-21 ENCOUNTER — Other Ambulatory Visit: Payer: Self-pay

## 2019-11-21 DIAGNOSIS — D333 Benign neoplasm of cranial nerves: Secondary | ICD-10-CM | POA: Insufficient documentation

## 2019-11-21 DIAGNOSIS — H9191 Unspecified hearing loss, right ear: Secondary | ICD-10-CM | POA: Insufficient documentation

## 2019-11-21 DIAGNOSIS — Z79899 Other long term (current) drug therapy: Secondary | ICD-10-CM | POA: Insufficient documentation

## 2019-11-21 DIAGNOSIS — Z833 Family history of diabetes mellitus: Secondary | ICD-10-CM | POA: Insufficient documentation

## 2019-11-21 DIAGNOSIS — Z86018 Personal history of other benign neoplasm: Secondary | ICD-10-CM | POA: Insufficient documentation

## 2019-11-21 DIAGNOSIS — Z8719 Personal history of other diseases of the digestive system: Secondary | ICD-10-CM | POA: Insufficient documentation

## 2019-11-21 DIAGNOSIS — R253 Fasciculation: Secondary | ICD-10-CM | POA: Insufficient documentation

## 2019-11-21 DIAGNOSIS — Z8249 Family history of ischemic heart disease and other diseases of the circulatory system: Secondary | ICD-10-CM | POA: Insufficient documentation

## 2019-11-21 MED ORDER — GADOBUTROL 1 MMOL/ML IV SOLN
8.0000 mL | Freq: Once | INTRAVENOUS | Status: AC | PRN
  Administered 2019-11-21: 8 mL via INTRAVENOUS

## 2019-11-24 ENCOUNTER — Inpatient Hospital Stay: Payer: Commercial Managed Care - PPO

## 2019-11-28 ENCOUNTER — Other Ambulatory Visit: Payer: Self-pay

## 2019-11-28 ENCOUNTER — Inpatient Hospital Stay (HOSPITAL_BASED_OUTPATIENT_CLINIC_OR_DEPARTMENT_OTHER): Payer: Commercial Managed Care - PPO | Admitting: Internal Medicine

## 2019-11-28 VITALS — BP 142/110 | HR 78 | Temp 97.7°F | Resp 17 | Ht 63.0 in | Wt 187.9 lb

## 2019-11-28 DIAGNOSIS — Z79899 Other long term (current) drug therapy: Secondary | ICD-10-CM | POA: Diagnosis not present

## 2019-11-28 DIAGNOSIS — Z86018 Personal history of other benign neoplasm: Secondary | ICD-10-CM | POA: Diagnosis not present

## 2019-11-28 DIAGNOSIS — Z833 Family history of diabetes mellitus: Secondary | ICD-10-CM | POA: Diagnosis not present

## 2019-11-28 DIAGNOSIS — D333 Benign neoplasm of cranial nerves: Secondary | ICD-10-CM

## 2019-11-28 DIAGNOSIS — Z8719 Personal history of other diseases of the digestive system: Secondary | ICD-10-CM | POA: Diagnosis not present

## 2019-11-28 DIAGNOSIS — H9191 Unspecified hearing loss, right ear: Secondary | ICD-10-CM | POA: Diagnosis not present

## 2019-11-28 DIAGNOSIS — R253 Fasciculation: Secondary | ICD-10-CM | POA: Diagnosis not present

## 2019-11-28 DIAGNOSIS — Z8249 Family history of ischemic heart disease and other diseases of the circulatory system: Secondary | ICD-10-CM | POA: Diagnosis not present

## 2019-11-28 NOTE — Progress Notes (Signed)
[facility] [place], [place] [address] [phone]   Interval Evaluation  Date of Service: 11/28/19 Patient Name: Theresa Lester Patient MRN: [id number] Patient DOB: 12/14/1970 Provider: Ventura Sellers, MD  Identifying Statement:  Theresa Lester is a 49 y.o. female with R vestibular schwannoma  Referring Provider: [name], MD [address] [place],  [place] [address]  Oncologic History: 03/01/2015-03/10/2015: Stereotactic radiosurgery to the 7 mm vestibular schwannoma to 25 Gy in 5 fractions of 5 Gy each.   Interval History:  Theresa Lester presents today following recent brain MRI.  She does acknowledge persistent hearing loss which may be somewhat worse compared to last year. No issues with left sided hearing.  Facial twitching is very rare and sporadic, she has essentially discontinued use of tegretol.  No issue with balance, coordination, gait or memory.    Medications: Current Outpatient Medications on File Prior to Visit  Medication Sig Dispense Refill  . carbamazepine (TEGRETOL XR) 100 MG 12 hr tablet Take 100 mg by mouth 2 (two) times daily as needed.    Marland Kitchen ibuprofen (ADVIL) 800 MG tablet Take 600 mg by mouth daily.    . Lidocaine, Anorectal, 5 % CREA Apply 1 application topically as needed. 15 g 0  . pramoxine-hydrocortisone (PROCTOCREAM-HC) 1-1 % rectal cream Place 1 application rectally 2 (two) times daily. 30 g 0  . PULMICORT FLEXHALER 90 MCG/ACT inhaler TAKE 1 PUFF BY MOUTH TWICE A DAY 1 each 5   No current facility-administered medications on file prior to visit.    Allergies: No Known Allergies Past Medical History:  Past Medical History:  Diagnosis Date  . Acoustic neuroma ([facility])   . Arthralgia of hip or thigh 10/19/2014  . Blood glucose elevated 10/19/2014   A1C 5.7 [08/2013]   . Calculus of gallbladder   . Encounter for screening colonoscopy   . Fast heart  beat 10/19/2014   TFTs normal [02/2014]   . GERD (gastroesophageal reflux disease)   . Hemorrhoids, internal   . Left axillary hidradenitis 11/05/2015  . Restless leg   . Tinnitus 11/25/2014   Past Surgical History:  Past Surgical History:  Procedure Laterality Date  . BILATERAL CARPAL TUNNEL RELEASE    . COLONOSCOPY WITH PROPOFOL N/A 11/01/2018   Procedure: COLONOSCOPY WITH BIOPSY;  Surgeon: [name], MD;  Location: [facility];  Service: Endoscopy;  Laterality: N/A;  . KNEE ARTHROSCOPY WITH MEDIAL MENISECTOMY Left 01/18/2017   Procedure: KNEE ARTHROSCOPY WITH MEDIAL MENISECTOMY ROOT REPAIR CHONDROPLASTY;  Surgeon: Leim Fabry, MD;  Location: [facility];  Service: Orthopedics;  Laterality: Left;  SUPINE WITH ACL LEG HOLDER ARTHREX ALEX  FLIP CUTTER, MENISCUS ROOT GUIDE STRYKER [place] CETERIX AIR  ANESTHESIA: ADDUCTOR CANAL NERVE BLOCK  . KNEE ARTHROSCOPY WITH MEDIAL MENISECTOMY Right 07/16/2018   Procedure: KNEE ARTHROSCOPY WITH MEDIAL MENISCUS  ROOT REPAIR;  Surgeon: Leim Fabry, MD;  Location: [facility];  Service: Orthopedics;  Laterality: Right;  MENSICUS ROOT GUIDE KIT + SWIVELOCKS,  SWIVELOCK TIBIAL FIXATION CETERIX OPEN KNEE TRAY BOVIE ELECTROCAUTERY  . POLYPECTOMY N/A 11/01/2018   Procedure: POLYPECTOMY;  Surgeon: [name], MD;  Location: [facility];  Service: Endoscopy;  Laterality: N/A;  . TUBAL LIGATION    . VESTIBULAR NERVE SECTION Right 2017   Social History:  Social History   Socioeconomic History  . Marital status: Married    Spouse name: Not on file  . Number  of children: Not on file  . Years of education: Not on file  . Highest education level: Not on file  Occupational History  . Not on file  Tobacco Use  . Smoking status: Never Smoker  . Smokeless tobacco: Never Used  Vaping Use  . Vaping Use: Never used  Substance and Sexual Activity  . Alcohol use: No    Alcohol/week: 0.0 standard drinks  . Drug use: No  .  Sexual activity: Yes  Other Topics Concern  . Not on file  Social History Narrative  . Not on file   Social Determinants of Health   Financial Resource Strain:   . Difficulty of Paying Living Expenses: Not on file  Food Insecurity:   . Worried About Charity fundraiser in the Last Year: Not on file  . Ran Out of Food in the Last Year: Not on file  Transportation Needs:   . Lack of Transportation (Medical): Not on file  . Lack of Transportation (Non-Medical): Not on file  Physical Activity:   . Days of Exercise per Week: Not on file  . Minutes of Exercise per Session: Not on file  Stress:   . Feeling of Stress : Not on file  Social Connections:   . Frequency of Communication with Friends and Family: Not on file  . Frequency of Social Gatherings with Friends and Family: Not on file  . Attends Religious Services: Not on file  . Active Member of Clubs or Organizations: Not on file  . Attends Archivist Meetings: Not on file  . Marital Status: Not on file  Intimate Partner Violence:   . Fear of Current or Ex-Partner: Not on file  . Emotionally Abused: Not on file  . Physically Abused: Not on file  . Sexually Abused: Not on file   Family History:  Family History  Problem Relation Age of Onset  . Hypertension Mother   . Diabetes Mother   . Breast cancer Neg Hx     Review of Systems: Constitutional: Denies fevers, chills or abnormal weight loss Eyes: Denies blurriness of vision Ears, nose, mouth, throat, and face: Denies mucositis or sore throat Respiratory: Denies cough, dyspnea or wheezes Cardiovascular: Denies palpitation, chest discomfort or lower extremity swelling Gastrointestinal:  Denies nausea, constipation, diarrhea GU: Denies dysuria or incontinence Skin: Denies abnormal skin rashes Neurological: Per HPI Musculoskeletal: Denies joint pain, back or neck discomfort. No decrease in ROM Behavioral/Psych: Denies anxiety, disturbance in thought content, and  mood instability  Physical Exam: Vitals:   11/28/19 0935  BP: (!) 142/110  Pulse: 78  Resp: 17  Temp: 97.7 F (36.5 C)  SpO2: 100%   KPS: 90. General: Alert, cooperative, pleasant, in no acute distress Head: Normal EENT: Impaired hearing right ear Lungs: Resp effort normal Cardiac: Regular rate and rhythm Abdomen: Soft, non-distended abdomen Skin: No rashes cyanosis or petechiae. Extremities: No clubbing or edema  Neurologic Exam: Mental Status: Awake, alert, attentive to examiner. Oriented to self and environment. Language is fluent with intact comprehension.  Cranial Nerves: Visual acuity is grossly normal. Visual fields are full. Extra-ocular movements intact. No ptosis. Face is symmetric, tongue midline. Motor: Tone and bulk are normal. Power is full in both arms and legs. Reflexes are symmetric, no pathologic reflexes present. Intact finger to nose bilaterally Sensory: Intact to light touch and temperature Gait: Normal and tandem gait is normal.   Labs: I have reviewed the data as listed    Component Value Date/Time  NA 140 09/13/2018 1115   K 4.3 09/13/2018 1115   CL 103 09/13/2018 1115   CO2 24 09/13/2018 1115   GLUCOSE 78 09/13/2018 1115   GLUCOSE 104 (H) 06/11/2015 1748   BUN 10 09/13/2018 1115   BUN 13.0 02/17/2015 1505   CREATININE 0.60 09/13/2018 1115   CREATININE 0.7 02/17/2015 1505   CALCIUM 8.9 09/13/2018 1115   PROT 6.5 09/13/2018 1115   ALBUMIN 4.2 09/13/2018 1115   AST 17 09/13/2018 1115   ALT 17 09/13/2018 1115   ALKPHOS 90 09/13/2018 1115   BILITOT <0.2 09/13/2018 1115   GFRNONAA 107 09/13/2018 1115   GFRAA 124 09/13/2018 1115   Lab Results  Component Value Date   WBC 7.9 09/13/2018   NEUTROABS 4.9 09/13/2018   HGB 13.0 09/13/2018   HCT 38.3 09/13/2018   MCV 85 09/13/2018   PLT 261 09/13/2018    Imaging: [place] Clinician Interpretation: I have personally reviewed the CNS images as listed.  My interpretation, in the context of the  patient's clinical presentation, is stable disease  MR Brain W Wo Contrast  Result Date: 11/23/2019 CLINICAL DATA:  Right tubular schwannoma, follow-up. EXAM: MRI HEAD WITHOUT AND WITH CONTRAST TECHNIQUE: Multiplanar, multiecho pulse sequences of the brain and surrounding structures were obtained without and with intravenous contrast. CONTRAST:  67m GADAVIST GADOBUTROL 1 MMOL/ML IV SOLN COMPARISON:  Annual MRI head and IAC from 2017 2 present. FINDINGS: Brain: The right IAC mass lesion continues to decrease in size, now measuring 6.9 mm in maximal dimension compared with 7.9 mm in 2020 8.2 mm in 2019. Lesion continues to enhance. No new lesions are present. No acute infarct, hemorrhage, or mass lesion is present otherwise. No significant white matter lesions are present. The ventricles are of normal size. No significant extraaxial fluid collection is present. Left ICA is normal. The brainstem and cerebellum are within normal limits. Postcontrast images through the remainder the brain are within normal limits. Vascular: Flow is present in the major intracranial arteries. Skull and upper cervical spine: The craniocervical junction is normal. Upper cervical spine is within normal limits. Marrow signal is unremarkable. Sinuses/Orbits: The paranasal sinuses and mastoid air cells are clear. The globes and orbits are within normal limits. IMPRESSION: 1. Continued decrease in size of right IAC mass lesion, now measuring 6.9 mm in maximal dimension compared with 7.9 mm in 2020 in 2019. 2. No new lesions are present. 3. Otherwise normal MRI appearance of the brain. Electronically Signed   By: CSan MorelleM.D.   On: 11/23/2019 07:42    Assessment/Plan Vestibular schwannoma ([facility] [D33.3]   EOrit Sanvilleis clinically and radiographically stable today.  She does have ongoing hearing impairment affecting the right ear.    She should continue Tegretol 1056mBID only as needed for facial  twitching.  We will refer her to ENT for audiologic evaluation.  She should return in 1 year following MRI brain for evaluation.  All questions were answered. The patient knows to call the clinic with any problems, questions or concerns. No barriers to learning were detected.  I have spent a total of 30 minutes of face-to-face and non-face-to-face time, excluding clinical staff time, preparing to see patient, ordering tests and/or medications, counseling the patient, and independently interpreting results and communicating results to the patient/family/caregiver    ZaVentura SellersMD Medical Director of Neuro-Oncology [facility]1/12/21 9:17 AM

## 2019-12-09 ENCOUNTER — Encounter: Payer: Commercial Managed Care - PPO | Admitting: Internal Medicine

## 2020-01-05 ENCOUNTER — Telehealth: Payer: Self-pay | Admitting: *Deleted

## 2020-01-05 NOTE — Telephone Encounter (Signed)
Dr Pollie Friar office called to advise that they do not perform the type of surgery the patient needs and recommended a referral to [facility].  Notified MD.

## 2020-01-06 ENCOUNTER — Other Ambulatory Visit: Payer: Self-pay | Admitting: *Deleted

## 2020-01-06 DIAGNOSIS — D333 Benign neoplasm of cranial nerves: Secondary | ICD-10-CM

## 2020-01-19 ENCOUNTER — Other Ambulatory Visit
Admission: RE | Admit: 2020-01-19 | Discharge: 2020-01-19 | Disposition: A | Discharge status: Home / Self Care | Payer: Commercial Managed Care - PPO | Attending: Internal Medicine | Admitting: Internal Medicine

## 2020-01-19 ENCOUNTER — Ambulatory Visit (INDEPENDENT_AMBULATORY_CARE_PROVIDER_SITE_OTHER): Payer: Commercial Managed Care - PPO | Admitting: Internal Medicine

## 2020-01-19 ENCOUNTER — Encounter: Payer: Self-pay | Admitting: Internal Medicine

## 2020-01-19 ENCOUNTER — Other Ambulatory Visit: Payer: Self-pay

## 2020-01-19 VITALS — BP 118/76 | HR 86 | Temp 98.0°F | Ht 63.0 in | Wt 188.0 lb

## 2020-01-19 DIAGNOSIS — Z1159 Encounter for screening for other viral diseases: Secondary | ICD-10-CM

## 2020-01-19 DIAGNOSIS — G2581 Restless legs syndrome: Secondary | ICD-10-CM | POA: Diagnosis not present

## 2020-01-19 DIAGNOSIS — J4530 Mild persistent asthma, uncomplicated: Secondary | ICD-10-CM | POA: Insufficient documentation

## 2020-01-19 DIAGNOSIS — Z0000 Encounter for general adult medical examination without abnormal findings: Secondary | ICD-10-CM

## 2020-01-19 DIAGNOSIS — Z1231 Encounter for screening mammogram for malignant neoplasm of breast: Secondary | ICD-10-CM | POA: Diagnosis not present

## 2020-01-19 DIAGNOSIS — Z111 Encounter for screening for respiratory tuberculosis: Secondary | ICD-10-CM | POA: Diagnosis not present

## 2020-01-19 LAB — LIPID PANEL
Cholesterol: 163 mg/dL (ref 0–200)
HDL: 63 mg/dL (ref >40)
LDL Cholesterol: 74 mg/dL (ref 0–99)
Total CHOL/HDL Ratio: 2.6 RATIO
Triglycerides: 128 mg/dL (ref <150)
VLDL: 26 mg/dL (ref 0–40)

## 2020-01-19 LAB — COMPREHENSIVE METABOLIC PANEL
ALT: 18 U/L (ref 0–44)
AST: 19 U/L (ref 15–41)
Albumin: 3.9 g/dL (ref 3.5–5.0)
Alkaline Phosphatase: 68 U/L (ref 38–126)
Anion gap: 9 (ref 5–15)
BUN: 14 mg/dL (ref 6–20)
CO2: 28 mmol/L (ref 22–32)
Calcium: 8.6 mg/dL — ABNORMAL LOW (ref 8.9–10.3)
Chloride: 101 mmol/L (ref 98–111)
Creatinine, Ser: 0.82 mg/dL (ref 0.44–1.00)
GFR, Estimated: >60 mL/min (ref >60)
Glucose, Bld: 102 mg/dL — ABNORMAL HIGH (ref 70–99)
Potassium: 3.9 mmol/L (ref 3.5–5.1)
Sodium: 138 mmol/L (ref 135–145)
Total Bilirubin: 0.4 mg/dL (ref 0.3–1.2)
Total Protein: 7.2 g/dL (ref 6.5–8.1)

## 2020-01-19 LAB — TSH: TSH: 2.395 u[IU]/mL (ref 0.350–4.500)

## 2020-01-19 LAB — CBC WITH DIFFERENTIAL/PLATELET
Abs Immature Granulocytes: 0.01 10*3/uL (ref 0.00–0.07)
Basophils Absolute: 0.1 10*3/uL (ref 0.0–0.1)
Basophils Relative: 1 %
Eosinophils Absolute: 0.2 10*3/uL (ref 0.0–0.5)
Eosinophils Relative: 4 %
HCT: 39.5 % (ref 36.0–46.0)
Hemoglobin: 12.5 g/dL (ref 12.0–15.0)
Immature Granulocytes: 0 %
Lymphocytes Relative: 28 %
Lymphs Abs: 1.6 10*3/uL (ref 0.7–4.0)
MCH: 26.6 pg (ref 26.0–34.0)
MCHC: 31.6 g/dL (ref 30.0–36.0)
MCV: 84 fL (ref 80.0–100.0)
Monocytes Absolute: 0.6 10*3/uL (ref 0.1–1.0)
Monocytes Relative: 11 %
Neutro Abs: 3.3 10*3/uL (ref 1.7–7.7)
Neutrophils Relative %: 56 %
Platelets: 295 10*3/uL (ref 150–400)
RBC: 4.7 MIL/uL (ref 3.87–5.11)
RDW: 13.9 % (ref 11.5–15.5)
WBC: 5.8 10*3/uL (ref 4.0–10.5)
nRBC: 0 % (ref 0.0–0.2)

## 2020-01-19 LAB — IRON AND TIBC
Iron: 70 ug/dL (ref 28–170)
Saturation Ratios: 17 % (ref 10.4–31.8)
TIBC: 413 ug/dL (ref 250–450)
UIBC: 343 ug/dL

## 2020-01-19 LAB — HEPATITIS C ANTIBODY: HCV Ab: NONREACTIVE

## 2020-01-19 LAB — MAGNESIUM: Magnesium: 1.8 mg/dL (ref 1.7–2.4)

## 2020-01-19 LAB — FERRITIN: Ferritin: 8 ng/mL — ABNORMAL LOW (ref 11–307)

## 2020-01-19 MED ORDER — PULMICORT FLEXHALER 90 MCG/ACT IN AEPB: 1.0000 | INHALATION_SPRAY | Freq: Two times a day (BID) | RESPIRATORY_TRACT | 5 refills | Status: DC

## 2020-01-19 MED ORDER — GABAPENTIN 100 MG PO CAPS: 100.0000 mg | ORAL_CAPSULE | Freq: Every evening | ORAL | 0 refills | Status: DC

## 2020-01-19 NOTE — Progress Notes (Signed)
Date:  01/19/2020   Name:  Theresa Lester   DOB:  07-26-1970   MRN:  [id number]   Chief Complaint: Annual Exam (Breast exam no pap, iron test and bone density for arthritis, fatigue, anemic? ) and Joint Pain (And muscle pain )  Theresa Lester is a 50 y.o. female who presents today for her Complete Annual Exam. She feels well. She reports exercising walking X3-4 days a week and leg exercise.. She reports she is sleeping well. Breast complaints none.  Mammogram: 04/2019 Pap smear: 08/2017 Colonoscopy: 10/2018  Immunization History  Administered Date(s) Administered  . Hepatitis B, adult 02/27/2018, 03/29/2018, 08/30/2018  . Influenza, Seasonal, Injecte, Preservative Fre 01/31/2012  . Influenza,inj,Quad PF,6+ Mos 12/18/2013, 10/20/2014, 10/11/2016, 09/13/2018, 11/03/2019  . Moderna Sars-Covid-2 Vaccination 05/06/2019, 06/03/2019  . PPD Test 02/25/2018  . Tdap 09/11/2017  . Zoster Recombinat (Shingrix) 07/11/2019, 10/14/2019   Restless leg - she feels the need to move her legs in the evening and at bedtime.  Walking helps.  Once the sx resolve, they do not return that day. Asthma There is no cough, shortness of breath or wheezing. This is a recurrent problem. The problem occurs rarely. Pertinent negatives include no chest pain, fever, headaches or trouble swallowing. Her past medical history is significant for asthma.  Shoulder Pain  There has been no history of extremity trauma. The problem occurs daily. The problem has been waxing and waning. Pertinent negatives include no fever.     Lab Results  Component Value Date   CREATININE 0.60 09/13/2018   BUN 10 09/13/2018   NA 140 09/13/2018   K 4.3 09/13/2018   CL 103 09/13/2018   CO2 24 09/13/2018   Lab Results  Component Value Date   CHOL 167 09/13/2018   HDL 63 09/13/2018   LDLCALC 69 09/13/2018   TRIG 177 (H) 09/13/2018   CHOLHDL 2.7 09/13/2018   Lab Results  Component Value Date   TSH 5.790 (H)  09/13/2018   No results found for: HGBA1C Lab Results  Component Value Date   WBC 7.9 09/13/2018   HGB 13.0 09/13/2018   HCT 38.3 09/13/2018   MCV 85 09/13/2018   PLT 261 09/13/2018   Lab Results  Component Value Date   ALT 17 09/13/2018   AST 17 09/13/2018   ALKPHOS 90 09/13/2018   BILITOT <0.2 09/13/2018     Review of Systems  Constitutional: Negative for chills, fatigue and fever.  HENT: Negative for congestion, hearing loss, tinnitus, trouble swallowing and voice change.   Eyes: Negative for visual disturbance.  Respiratory: Negative for cough, chest tightness, shortness of breath and wheezing.   Cardiovascular: Negative for chest pain, palpitations and leg swelling.  Gastrointestinal: Negative for abdominal pain, constipation, diarrhea and vomiting.  Endocrine: Negative for polydipsia and polyuria.  Genitourinary: Negative for dysuria, frequency, genital sores, vaginal bleeding and vaginal discharge.  Musculoskeletal: Negative for arthralgias, gait problem and joint swelling.  Skin: Negative for color change and rash.  Neurological: Negative for dizziness, tremors, light-headedness and headaches.  Hematological: Negative for adenopathy. Does not bruise/bleed easily.  Psychiatric/Behavioral: Negative for dysphoric mood and sleep disturbance. The patient is not nervous/anxious.     Patient Active Problem List   Diagnosis Date Noted  . Asthma in adult without complication 07/62/2633  . Benign neoplasm of cecum   . Immunization, BCG 03/01/2018  . Tricompartment osteoarthritis of left knee 10/11/2016  . Hemifacial spasm   . TIA (transient ischemic attack) 06/11/2015  .  Episodic tension-type headache, not intractable 04/14/2015  . Vestibular schwannoma ([facility]) 01/12/2015  . Angioneurotic edema 10/19/2014  . Calculus of gallbladder 10/19/2014  . Acid reflux 10/19/2014  . Hemorrhoids, internal 10/19/2014    No Known Allergies  Past Surgical History:  Procedure  Laterality Date  . BILATERAL CARPAL TUNNEL RELEASE    . COLONOSCOPY WITH PROPOFOL N/A 11/01/2018   Procedure: COLONOSCOPY WITH BIOPSY;  Surgeon: [name], MD;  Location: [facility];  Service: Endoscopy;  Laterality: N/A;  . KNEE ARTHROSCOPY WITH MEDIAL MENISECTOMY Left 01/18/2017   Procedure: KNEE ARTHROSCOPY WITH MEDIAL MENISECTOMY ROOT REPAIR CHONDROPLASTY;  Surgeon: Leim Fabry, MD;  Location: [facility];  Service: Orthopedics;  Laterality: Left;  SUPINE WITH ACL LEG HOLDER ARTHREX ALEX  FLIP CUTTER, MENISCUS ROOT GUIDE STRYKER [place] CETERIX AIR  ANESTHESIA: ADDUCTOR CANAL NERVE BLOCK  . KNEE ARTHROSCOPY WITH MEDIAL MENISECTOMY Right 07/16/2018   Procedure: KNEE ARTHROSCOPY WITH MEDIAL MENISCUS  ROOT REPAIR;  Surgeon: Leim Fabry, MD;  Location: [facility];  Service: Orthopedics;  Laterality: Right;  MENSICUS ROOT GUIDE KIT + SWIVELOCKS,  SWIVELOCK TIBIAL FIXATION CETERIX OPEN KNEE TRAY BOVIE ELECTROCAUTERY  . POLYPECTOMY N/A 11/01/2018   Procedure: POLYPECTOMY;  Surgeon: [name], MD;  Location: [facility];  Service: Endoscopy;  Laterality: N/A;  . TUBAL LIGATION    . VESTIBULAR NERVE SECTION Right 2017    Social History   Tobacco Use  . Smoking status: Never Smoker  . Smokeless tobacco: Never Used  Vaping Use  . Vaping Use: Never used  Substance Use Topics  . Alcohol use: No    Alcohol/week: 0.0 standard drinks  . Drug use: No     Medication list has been reviewed and updated.  Current Meds  Medication Sig  . carbamazepine (TEGRETOL XR) 100 MG 12 hr tablet Take 100 mg by mouth 2 (two) times daily as needed.  Marland Kitchen ibuprofen (ADVIL) 800 MG tablet Take 600 mg by mouth daily.  . Lidocaine, Anorectal, 5 % CREA Apply 1 application topically as needed.  . pramoxine-hydrocortisone (PROCTOCREAM-HC) 1-1 % rectal cream Place 1 application rectally 2 (two) times daily.  Marland Kitchen PULMICORT FLEXHALER 90 MCG/ACT inhaler TAKE 1 PUFF BY MOUTH TWICE A DAY     PHQ 2/9 Scores 01/19/2020 11/03/2019 04/01/2019 02/25/2019  PHQ - 2 Score 0 0 0 0  PHQ- 9 Score 0 0 0 1    GAD 7 : Generalized Anxiety Score 01/19/2020 11/03/2019  Nervous, Anxious, on Edge 0 0  Control/stop worrying 0 0  Worry too much - different things 0 0  Trouble relaxing 0 0  Restless 0 0  Easily annoyed or irritable 0 0  Afraid - awful might happen 0 0  Total GAD 7 Score 0 0  Anxiety Difficulty - Not difficult at all    BP Readings from Last 3 Encounters:  01/19/20 118/76  11/28/19 (!) 142/110  11/06/19 131/86    Physical Exam Vitals and nursing note reviewed.  Constitutional:      General: She is not in acute distress.    Appearance: She is well-developed.  HENT:     Head: Normocephalic and atraumatic.     Right Ear: Tympanic membrane and ear canal normal.     Left Ear: Tympanic membrane and ear canal normal.     Nose:     Right Sinus: No maxillary sinus tenderness.     Left Sinus: No maxillary sinus tenderness.  Eyes:     General: No scleral icterus.  Right eye: No discharge.        Left eye: No discharge.     Conjunctiva/sclera: Conjunctivae normal.  Neck:     Thyroid: No thyromegaly.     Vascular: No carotid bruit.  Cardiovascular:     Rate and Rhythm: Normal rate and regular rhythm.     Pulses: Normal pulses.     Heart sounds: Normal heart sounds.  Pulmonary:     Effort: Pulmonary effort is normal. No respiratory distress.     Breath sounds: No wheezing.  Chest:  Breasts:     Right: No mass, nipple discharge, skin change or tenderness.     Left: No mass, nipple discharge, skin change or tenderness.    Abdominal:     General: Bowel sounds are normal.     Palpations: Abdomen is soft.     Tenderness: There is no abdominal tenderness.  Musculoskeletal:     Right shoulder: Normal.     Left shoulder: Normal.     Right elbow: Normal.     Left elbow: Normal.     Right wrist: Normal.     Left wrist: Normal.     Cervical back: Normal range of  motion. No erythema.     Right lower leg: No edema.     Left lower leg: No edema.     Comments: Tinels and phalens are negative  Lymphadenopathy:     Cervical: No cervical adenopathy.  Skin:    General: Skin is warm and dry.     Findings: No rash.  Neurological:     Mental Status: She is alert and oriented to person, place, and time.     Cranial Nerves: No cranial nerve deficit.     Sensory: No sensory deficit.     Deep Tendon Reflexes: Reflexes are normal and symmetric.  Psychiatric:        Attention and Perception: Attention normal.        Mood and Affect: Mood normal.        Speech: Speech normal.     Wt Readings from Last 3 Encounters:  01/19/20 188 lb (85.3 kg)  11/28/19 187 lb 14.4 oz (85.2 kg)  11/06/19 186 lb (84.4 kg)    BP 118/76   Pulse 86   Temp 98 F (36.7 C) (Oral)   Ht _0  (1.6 m)   Wt 188 lb (85.3 kg)   SpO2 98%   BMI 33.30 kg/m   Assessment and Plan: 1. Annual physical exam Exam is normal except for weight. Encourage regular exercise and appropriate dietary changes. - CBC with Differential/Platelet - Comprehensive metabolic panel - Lipid panel - TSH  2. Encounter for screening mammogram for breast cancer Schedule Mammogram for April  3. Need for hepatitis C screening test - Hepatitis C antibody  4. Mild persistent asthma without complication Asthma is well controlled on Budesonide.  No use of albuterol rescue inhaler needed. - Budesonide (PULMICORT FLEXHALER) 90 MCG/ACT inhaler; Inhale 1 puff into the lungs 2 (two) times daily.  Dispense: 1 each; Refill: 5  5. Restless leg syndrome Begin low dose gabapentin at 7 PM nightly. - Iron, TIBC and Ferritin Panel - Magnesium - gabapentin (NEURONTIN) 100 MG capsule; Take 1 capsule (100 mg total) by mouth every evening.  Dispense: 90 capsule; Refill: 0   Partially dictated using Editor, commissioning. Any errors are unintentional.  Halina Maidens, MD [facility]  01/19/2020

## 2020-01-21 ENCOUNTER — Other Ambulatory Visit: Payer: Self-pay

## 2020-01-21 DIAGNOSIS — R7611 Nonspecific reaction to tuberculin skin test without active tuberculosis: Secondary | ICD-10-CM

## 2020-01-21 LAB — QUANTIFERON-TB GOLD PLUS (RQFGPL)
QuantiFERON Mitogen Value: 7.19 IU/mL
QuantiFERON Nil Value: 0.65 IU/mL
QuantiFERON TB1 Ag Value: 6.21 IU/mL
QuantiFERON TB2 Ag Value: 7.4 IU/mL

## 2020-01-21 LAB — QUANTIFERON-TB GOLD PLUS: QuantiFERON-TB Gold Plus: POSITIVE — AB

## 2020-01-22 ENCOUNTER — Ambulatory Visit: Payer: Commercial Managed Care - PPO

## 2020-01-22 ENCOUNTER — Ambulatory Visit
Admission: RE | Admit: 2020-01-22 | Discharge: 2020-01-22 | Disposition: A | Discharge status: Home / Self Care | Payer: Commercial Managed Care - PPO | Source: Ambulatory Visit | Attending: Internal Medicine | Admitting: Internal Medicine

## 2020-01-22 DIAGNOSIS — R7611 Nonspecific reaction to tuberculin skin test without active tuberculosis: Secondary | ICD-10-CM

## 2020-01-28 ENCOUNTER — Ambulatory Visit: Payer: Commercial Managed Care - PPO | Attending: Internal Medicine | Admitting: Audiologist

## 2020-01-28 ENCOUNTER — Other Ambulatory Visit: Payer: Self-pay

## 2020-01-28 DIAGNOSIS — H903 Sensorineural hearing loss, bilateral: Secondary | ICD-10-CM | POA: Diagnosis present

## 2020-01-28 DIAGNOSIS — D333 Benign neoplasm of cranial nerves: Secondary | ICD-10-CM | POA: Diagnosis not present

## 2020-01-28 NOTE — Procedures (Signed)
Outpatient Audiology and [place], [place]  [id number] [address]  EVALUATION  NAME: Theresa Lester     DOB:   September 26, 1970      MRN: [id number]                                                                                     DATE: 01/28/2020     PCP: [name], MD  Referent: Dr. Cecil Cobbs STATUS: Outpatient DIAGNOSIS: Vestibular schwannoma [facility]), Sensorineural hearing loss (SNHL) of both ears  History: Theresa Lester was seen for an audiological evaluation. Theresa Lester was accompanied to the appointment by her husband.  Theresa Lester is receiving a hearing evaluation due to concerns for decreased speech understanding in the right ear, Theresa Lester has a unilateral vestibular schwannoma. She is followed by a medical team and receives annual MRIs to monitor growth. She previously received radiation treatment. Theresa Lester has difficulty hearing in the right ear at all times. This difficulty has gradually been getting worse, she feels all sound in the right ear is more muffled. No pain or pressure reported in either ear. Tinnitus denied in both ears. No vertigo or dizziness. Medical history positive for vestibular schwannoma, diagnosed in 2017, which is a risk factor for hearing loss. No other relevant case history reported. No reported use of amplification.   Notes from last audiologic evaluation on 02/08/2015 with [facility] on [employer] state:  Pure tone audiometry reveals normal hearing in the left ear in the low frequencies with down-sloping to mild middle frequency sensorineural loss and moderate high frequency loss. Right-sided testing reveals mild low frequency sensorineural loss that slopes down to moderate middle and high frequency loss. Speech discrimination is 100% in the left ear and 92% in the right ear.  Notes from her last MRI, performed 11/28/19 state: Continued decrease in size of right IAC mass lesion, now  measuring 6.9 mm in maximal dimension compared with 7.9 mm in 2020 in 2019. 2. No new lesions are present. 3. Otherwise normal MRI appearance of the brain.   Ventura Sellers, MD Medical Director of Neuro-Oncology  Evaluation:   Otoscopy showed a clear view of the tympanic membranes, bilaterally  Tympanometry results were consistent with normal middle ear function, bilaterally    Audiometric testing was completed using conventional audiometry with insert and supraural transducer. Speech Recognition Thresholds were consistent with pure tone averages. Word Recognition was 96% in the left ear and 68% in the right ear at an elevated level. Speech in right ear played at loudest level she soul tolerate (80dB).   Pure tone thresholds show asymmetric sensorineural hearing loss in both ears. Hearing in the left ear slopes from normal to moderate sensorineural loss. Hearing in the right ear slopes from mild to moderate sensorineural loss.   Test results are consistent with previous testing description, there does not appear to have been a dramatic change in hearing thresholds. Ability to discriminate speech in the right ear has decreased by 24%.   Results:  The test results were reviewed with Theresa Lester and her husband. The nature and degree of hearing loss was explained. The hearing is relatively stable,  but her ability to understand speech in the right ear has decreased. This aligns with her experience of muffled hearing in the right ear. Her hearing loss is in the high frequencies prevents Theresa Lester from hearing high frequency consonants such as /s/ /sh/ /f/ /t/ and /th/. These sounds help differentiate the words she hears. Without these sounds, speech is muffled and unclear. [name] should have a trial with hearing aids to see how she benefits in the right ear. Two hearing aids are recommended. She was provided with a list of audiologists that dispense hearing aids and encouraged to go to a practice that  also has ENT Physicians due to the medical nature of her hearing loss. She has previously been cleared for hearing aids from Dr. Redmond Baseman. She was told to take the copies of the audiogram and this report to the audiologist she sees for hearing aids.   Recommendations: 1. Amplification is necessary for both ears. Hearing aids can be purchased from a variety of locations. See provided list for locations in the [place] area.  2. Continue to follow up with medical team to monitor vestibular schwannoma. This report will be sent to her provider, Dr. Cecil Cobbs and primary care provider Dr. Halina Maidens.    Alfonse Alpers  Audiologist, Au.D., CCC-A 01/28/2020  8:54 AM  Cc: [name], MD

## 2020-01-30 ENCOUNTER — Other Ambulatory Visit: Payer: Self-pay

## 2020-01-30 ENCOUNTER — Ambulatory Visit (LOCAL_COMMUNITY_HEALTH_CENTER): Payer: Self-pay

## 2020-01-30 VITALS — Ht 63.0 in | Wt 186.0 lb

## 2020-01-30 DIAGNOSIS — R7612 Nonspecific reaction to cell mediated immunity measurement of gamma interferon antigen response without active tuberculosis: Secondary | ICD-10-CM

## 2020-02-03 DIAGNOSIS — R7612 Nonspecific reaction to cell mediated immunity measurement of gamma interferon antigen response without active tuberculosis: Secondary | ICD-10-CM | POA: Insufficient documentation

## 2020-02-03 NOTE — Progress Notes (Signed)
EPI completed over phone d/t increased covid #s.   Referred from [facility] health for +QFT.  Patient reports has a hx of +PPDs and took TB medication (around age 50) as a child after an exposure. Has been in the [place] she was 50 y.o. She is currently a CNA/caregiver.   Patient states she gets tested and has CXR yearly.  Co patient per [place] TB control guidelines yearly CXR are not necessary and patient should only receive a TB screen for work purposes. Once you have a CXR, that CXR is good for life unless patient reports symptoms.  Encouraged patient to call [facility] if needed a TB screen for work.   Patient's dr ordered a CXR which she states she will have done next week. Patient labs and info can be found in Epic. Aileen Fass, RN

## 2020-02-10 ENCOUNTER — Ambulatory Visit: Payer: Commercial Managed Care - PPO | Attending: Infectious Diseases | Admitting: Infectious Diseases

## 2020-02-10 ENCOUNTER — Encounter: Payer: Self-pay | Admitting: Infectious Diseases

## 2020-02-10 ENCOUNTER — Other Ambulatory Visit
Admission: RE | Admit: 2020-02-10 | Discharge: 2020-02-10 | Disposition: A | Discharge status: Home / Self Care | Payer: Commercial Managed Care - PPO | Source: Home / Self Care | Attending: Internal Medicine | Admitting: Internal Medicine

## 2020-02-10 ENCOUNTER — Other Ambulatory Visit: Payer: Self-pay

## 2020-02-10 ENCOUNTER — Ambulatory Visit
Admission: RE | Admit: 2020-02-10 | Discharge: 2020-02-10 | Disposition: A | Discharge status: Home / Self Care | Payer: Commercial Managed Care - PPO | Attending: Internal Medicine | Admitting: Internal Medicine

## 2020-02-10 ENCOUNTER — Ambulatory Visit
Admission: RE | Admit: 2020-02-10 | Discharge: 2020-02-10 | Disposition: A | Discharge status: Home / Self Care | Payer: Commercial Managed Care - PPO | Source: Ambulatory Visit | Attending: Internal Medicine | Admitting: Internal Medicine

## 2020-02-10 VITALS — BP 118/78 | HR 88 | Temp 97.7°F | Resp 16 | Ht 63.0 in | Wt 189.0 lb

## 2020-02-10 DIAGNOSIS — R7612 Nonspecific reaction to cell mediated immunity measurement of gamma interferon antigen response without active tuberculosis: Secondary | ICD-10-CM

## 2020-02-10 DIAGNOSIS — G2581 Restless legs syndrome: Secondary | ICD-10-CM

## 2020-02-10 DIAGNOSIS — J45901 Unspecified asthma with (acute) exacerbation: Secondary | ICD-10-CM

## 2020-02-10 DIAGNOSIS — Z86018 Personal history of other benign neoplasm: Secondary | ICD-10-CM | POA: Insufficient documentation

## 2020-02-10 DIAGNOSIS — R7611 Nonspecific reaction to tuberculin skin test without active tuberculosis: Secondary | ICD-10-CM | POA: Insufficient documentation

## 2020-02-10 DIAGNOSIS — J45909 Unspecified asthma, uncomplicated: Secondary | ICD-10-CM | POA: Diagnosis not present

## 2020-02-10 DIAGNOSIS — Z7951 Long term (current) use of inhaled steroids: Secondary | ICD-10-CM | POA: Diagnosis not present

## 2020-02-10 LAB — HIV ANTIBODY (ROUTINE TESTING W REFLEX): HIV Screen 4th Generation wRfx: NONREACTIVE

## 2020-02-10 NOTE — Progress Notes (Signed)
NAME: Theresa Lester  DOB: [birth year]  MRN: [id number]  Date/Time: 02/10/2020 9:13 AM   Subjective:   ?referred for a positive quantiferon gold Theresa Lester is a 50 y.o. female with a history of rt IAC schwannoma, restless leg syndrome Pt is from [place], and moved here to the [place] when 50 yrs old- she had a positive PPD as a child She thinks she took treatment for it on the [employer]  [place] in [place]. 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 No fever or weight loss.  Past Medical History:  Diagnosis Date  . Acoustic neuroma ([place])   . Arthralgia of hip or thigh 10/19/2014  . Blood glucose elevated 10/19/2014   A1C 5.7 [08/2013]   . Calculus of gallbladder   . Encounter for screening colonoscopy   . Fast heart beat 10/19/2014   TFTs normal [02/2014]   . GERD (gastroesophageal reflux disease)   . Hemorrhoids, internal   . Left axillary hidradenitis 11/05/2015  . Restless leg   . Tinnitus 11/25/2014    Past Surgical History:  Procedure Laterality Date  . BILATERAL CARPAL TUNNEL RELEASE    . COLONOSCOPY WITH PROPOFOL N/A 11/01/2018   Procedure: COLONOSCOPY WITH BIOPSY;  Surgeon: [name], MD;  Location: [facility];  Service: Endoscopy;  Laterality: N/A;  . KNEE ARTHROSCOPY WITH MEDIAL MENISECTOMY Left 01/18/2017   Procedure: KNEE ARTHROSCOPY WITH MEDIAL MENISECTOMY ROOT REPAIR CHONDROPLASTY;  Surgeon: Leim Fabry, MD;  Location: [facility];  Service: Orthopedics;  Laterality: Left;  SUPINE WITH ACL LEG HOLDER ARTHREX ALEX  FLIP CUTTER, MENISCUS ROOT GUIDE STRYKER [place] CETERIX AIR  ANESTHESIA: ADDUCTOR CANAL NERVE BLOCK  . KNEE ARTHROSCOPY WITH MEDIAL MENISECTOMY Right 07/16/2018   Procedure: KNEE ARTHROSCOPY WITH MEDIAL MENISCUS  ROOT REPAIR;  Surgeon: Leim Fabry, MD;  Location: [facility];  Service: Orthopedics;   Laterality: Right;  MENSICUS ROOT GUIDE KIT + SWIVELOCKS,  SWIVELOCK TIBIAL FIXATION CETERIX OPEN KNEE TRAY BOVIE ELECTROCAUTERY  . POLYPECTOMY N/A 11/01/2018   Procedure: POLYPECTOMY;  Surgeon: [name], MD;  Location: [facility];  Service: Endoscopy;  Laterality: N/A;  . TUBAL LIGATION    . VESTIBULAR NERVE SECTION Right 2017    Social History   Socioeconomic History  . Marital status: Married    Spouse name: Not on file  . Number of children: Not on file  . Years of education: Not on file  . Highest education level: Not on file  Occupational History  . Occupation: CNA    Comment: Caregiver  Tobacco Use  . Smoking status: Never Smoker  . Smokeless tobacco: Never Used  Vaping Use  . Vaping Use: Never used  Substance and Sexual Activity  . Alcohol use: No    Alcohol/week: 0.0 standard drinks  . Drug use: No  . Sexual activity: Yes  Other Topics Concern  . Not on file  Social History Narrative  . Not on file   Social Determinants of Health   Financial Resource Strain: Not on file  Food Insecurity: Not on file  Transportation Needs: Not on file  Physical Activity: Not on file  Stress: Not on file  Social Connections: Not on file  Intimate Partner Violence: Not on file    Family History  Problem Relation Age of Onset  . Hypertension Mother   .  Diabetes Mother   . Breast cancer Neg Hx    No Known Allergies  ? Current Outpatient Medications  Medication Sig Dispense Refill  . Budesonide (PULMICORT FLEXHALER) 90 MCG/ACT inhaler Inhale 1 puff into the lungs 2 (two) times daily. 1 each 5  . carbamazepine (TEGRETOL XR) 100 MG 12 hr tablet Take 100 mg by mouth 2 (two) times daily as needed.    . gabapentin (NEURONTIN) 100 MG capsule Take 1 capsule (100 mg total) by mouth every evening. 90 capsule 0  . ibuprofen (ADVIL) 800 MG tablet Take 600 mg by mouth daily.    . pramoxine-hydrocortisone (PROCTOCREAM-HC) 1-1 % rectal cream Place 1 application rectally 2  (two) times daily. 30 g 0   No current facility-administered medications for this visit.     Abtx:  Anti-infectives (From admission, onward)   None      REVIEW OF SYSTEMS:  Const: negative fever, negative chills, negative weight loss Eyes: negative diplopia or visual changes, negative eye pain ENT: negative coryza, negative sore throat Resp:  Cough,no  hemoptysis, dyspnea Cards: negative for chest pain, palpitations, lower extremity edema GU: negative for frequency, dysuria and hematuria GI: Negative for abdominal pain, diarrhea, bleeding, constipation Skin: negative for rash and pruritus Heme: negative for easy bruising and gum/nose bleeding MS: negative for myalgias, arthralgias, back pain and muscle weakness Neurolo:negative for headaches, dizziness, vertigo, memory problems  Psych: negative for feelings of anxiety, depression  Endocrine: negative for thyroid, diabetes Allergy/Immunology- negative for any medication or food allergies ? Pertinent Positiv Objective:  VITALS:  BP 118/78   Pulse 88   Temp 97.7 F (36.5 C) (Temporal)   Resp 16   Ht 5' 3"  (1.6 m)   Wt 189 lb (85.7 kg)   SpO2 98%   BMI 33.48 kg/m  PHYSICAL EXAM:  General: Alert, cooperative, no distress, appears stated age.  Head: Normocephalic, without obvious abnormality, atraumatic. Eyes: Conjunctivae clear, anicteric sclerae. Pupils are equal ENT did not examine because of mask Neck: Supple, symmetrical, no adenopathy, thyroid: non tender no carotid bruit and no JVD. Back: No CVA tenderness. Lungs: Clear to auscultation bilaterally. No Wheezing or Rhonchi. No rales. Heart: Regular rate and rhythm, no murmur, rub or gallop. Abdomen: Soft, non-tender,not distended. Bowel sounds normal. No masses Extremities: atraumatic, no cyanosis. No edema. No clubbing Skin: No rashes or lesions. Or bruising Lymph: Cervical, supraclavicular normal. Neurologic: Grossly non-focal Pertinent Labs Lab Results CBC     Component Value Date/Time   WBC 5.8 01/19/2020 1120   RBC 4.70 01/19/2020 1120   HGB 12.5 01/19/2020 1120   HGB 13.0 09/13/2018 1115   HCT 39.5 01/19/2020 1120   HCT 38.3 09/13/2018 1115   PLT 295 01/19/2020 1120   PLT 261 09/13/2018 1115   MCV 84.0 01/19/2020 1120   MCV 85 09/13/2018 1115   MCH 26.6 01/19/2020 1120   MCHC 31.6 01/19/2020 1120   RDW 13.9 01/19/2020 1120   RDW 13.7 09/13/2018 1115   LYMPHSABS 1.6 01/19/2020 1120   LYMPHSABS 2.1 09/13/2018 1115   MONOABS 0.6 01/19/2020 1120   EOSABS 0.2 01/19/2020 1120   EOSABS 0.2 09/13/2018 1115   BASOSABS 0.1 01/19/2020 1120   BASOSABS 0.1 09/13/2018 1115    CMP Latest Ref Rng & Units 01/19/2020 09/13/2018 06/11/2015  Glucose 70 - 99 mg/dL 102(H) 78 104(H)  BUN 6 - 20 mg/dL 14 10 16   Creatinine 0.44 - 1.00 mg/dL 0.82 0.60 0.62  Sodium 135 - 145 mmol/L 138 140 138  Potassium 3.5 - 5.1 mmol/L 3.9 4.3 3.7  Chloride 98 - 111 mmol/L 101 103 107  CO2 22 - 32 mmol/L 28 24 25   Calcium 8.9 - 10.3 mg/dL 8.6(L) 8.9 8.6(L)  Total Protein 6.5 - 8.1 g/dL 7.2 6.5 7.4  Total Bilirubin 0.3 - 1.2 mg/dL 0.4 <0.2 0.2(L)  Alkaline Phos 38 - 126 U/L 68 90 73  AST 15 - 41 U/L 19 17 21   ALT 0 - 44 U/L 18 17 23       IMAGING RESULTS: None available ? Impression/Recommendation ?positive quantiferon gold- has had a positive PPD since childhood- she is not sure how long and what medicine she took before she came to the [place]- never had TB lung She works as a [employer]ar[employer] manager and would like to take treatment for latent TB now- she will get CXR and if normal will give rifampin 676m qd X 4 months- if CXR is abnormal will get 3 sputums for AFB She had taken BCG as a child but the quantiferon gold is independent of that  Asthma - has an inhaler Restless leg- started on neurontin  Rt IAC schwannoma- followed by neuro- observation Can get tingle on her rt face at times- takes PRN tegretol Told her not to take tegretol ( may not work) if she is going to be  started on rifampin. Will send prescription once CXR, HIV, HEPB sab done She is fully vaccinated and bossted for covid and also for hepb, shingles. Follow up 1 month -phone visit ? ? ___________________________________________________ Discussed with patient, requesting provider Note:  This document was prepared using Dragon voice recognition software and may include unintentional dictation errors.

## 2020-02-10 NOTE — Patient Instructions (Addendum)
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

## 2020-02-11 LAB — HEPATITIS B SURFACE ANTIBODY, QUANTITATIVE: Hep B S AB Quant (Post): 905.8 m[IU]/mL (ref >9.9)

## 2020-02-24 ENCOUNTER — Other Ambulatory Visit: Payer: Self-pay | Admitting: Infectious Diseases

## 2020-02-24 ENCOUNTER — Telehealth: Payer: Self-pay

## 2020-02-24 MED ORDER — RIFAMPIN 300 MG PO CAPS: 600.0000 mg | ORAL_CAPSULE | Freq: Every day | ORAL | 3 refills | Status: DC

## 2020-02-24 NOTE — Telephone Encounter (Signed)
can you call her and ask whether we can send the rifampin 600mg  to treat the positive qunatiferon gold?   Patient agrees and stated she will pick up letter stating she is on this treatment.

## 2020-02-24 NOTE — Progress Notes (Signed)
Sent prescription for rifampin to her pharmacy for treatment of positive quantiferon gold>

## 2020-02-25 ENCOUNTER — Encounter: Payer: Self-pay | Admitting: General Surgery

## 2020-03-18 ENCOUNTER — Ambulatory Visit: Payer: Commercial Managed Care - PPO | Attending: Infectious Diseases | Admitting: Infectious Diseases

## 2020-03-18 ENCOUNTER — Other Ambulatory Visit: Payer: Self-pay

## 2020-03-18 DIAGNOSIS — R7612 Nonspecific reaction to cell mediated immunity measurement of gamma interferon antigen response without active tuberculosis: Secondary | ICD-10-CM

## 2020-03-18 DIAGNOSIS — Z227 Latent tuberculosis: Secondary | ICD-10-CM

## 2020-03-18 DIAGNOSIS — R7611 Nonspecific reaction to tuberculin skin test without active tuberculosis: Secondary | ICD-10-CM | POA: Diagnosis not present

## 2020-03-18 NOTE — Progress Notes (Signed)
The purpose of this virtual visit is to provide medical care while limiting exposure to the novel coronavirus (COVID19) for both patient and office staff.   Consent was obtained for phone visit:  Yes.   Answered questions that patient had about telehealth interaction:  Yes.   I discussed the limitations, risks, security and privacy concerns of performing an evaluation and management service by telephone. I also discussed with the patient that there may be a patient responsible charge related to this service. The patient expressed understanding and agreed to proceed.   Patient Location: Home Provider Location:office Only the patient and provider on this call. This is a follow up after she started rifampin for positive PPD/quantiferon gold late February She is doing well after initial GI issues like nausea Periods irrgeular- not had her last period Says urine is oragne Wanted to know whether she could take neurontin, ibuprofen with rifampin- No CI Will get pregnancy test and lfts Continue rifampin to complete 4 months Total time spent on the phone 44min 45 sec

## 2020-04-08 ENCOUNTER — Other Ambulatory Visit: Payer: Self-pay

## 2020-04-08 DIAGNOSIS — E041 Nontoxic single thyroid nodule: Secondary | ICD-10-CM

## 2020-04-13 ENCOUNTER — Encounter: Payer: Self-pay | Admitting: Family Medicine

## 2020-04-13 ENCOUNTER — Other Ambulatory Visit: Payer: Self-pay

## 2020-04-13 ENCOUNTER — Ambulatory Visit: Payer: Commercial Managed Care - PPO | Admitting: Family Medicine

## 2020-04-13 ENCOUNTER — Ambulatory Visit: Payer: Self-pay | Admitting: *Deleted

## 2020-04-13 VITALS — BP 108/68 | HR 87 | Temp 98.2°F | Ht 63.0 in | Wt 200.2 lb

## 2020-04-13 DIAGNOSIS — M7712 Lateral epicondylitis, left elbow: Secondary | ICD-10-CM | POA: Diagnosis not present

## 2020-04-13 DIAGNOSIS — M1711 Unilateral primary osteoarthritis, right knee: Secondary | ICD-10-CM | POA: Diagnosis not present

## 2020-04-13 DIAGNOSIS — M1712 Unilateral primary osteoarthritis, left knee: Secondary | ICD-10-CM

## 2020-04-13 HISTORY — DX: Lateral epicondylitis, left elbow: M77.12

## 2020-04-13 MED ORDER — TRIAMCINOLONE ACETONIDE 40 MG/ML IJ SUSP
40.0000 mg | Freq: Once | INTRAMUSCULAR | Status: AC
  Administered 2020-04-13: 40 mg via INTRA_ARTICULAR

## 2020-04-13 NOTE — Assessment & Plan Note (Signed)
Right-hand-dominant patient with 1 month history of atraumatic left lateral elbow pain focal to the lateral epicondyle where she has tenderness, provocative testing is consistent with isolated lateral epicondylitis.  Treatment strategies were reviewed and she did elect to proceed with ultrasound-guided corticosteroid injection.  Postinjection care outlined.  Home-based rehab program discussed and we will coordinate a follow-up to review symptoms.  If suboptimal progress, anticipate initial plain films and possible advanced imaging, escalation of pharmacotherapy, all as clinically guided.

## 2020-04-13 NOTE — Addendum Note (Signed)
Addended by: Forbes Cellar on: 04/13/2020 11:12 AM   Modules accepted: Orders

## 2020-04-13 NOTE — Telephone Encounter (Signed)
Pt had left elbow injected this AM. States "Lasted about 5 hours." Reports now with 12/10 pain. Reports fingers were numb, "But a little better because I'm able to move them now." Denies swelling. States she has followed post procedure instructions. Has taken  400mg  IBU, "No relief."  States pain from elbow through shoulder. Called practice, Estill Bamberg who consulted with team, advised ED.  Pt aware, verbalizes understanding. States will follow disposition.   Reason for Disposition . [1] SEVERE pain AND [2] not improved 2 hours after pain medicine  Answer Assessment - Initial Assessment Questions 1. ONSET: "When did the pain start?"     Please see summary 2. LOCATION: "Where is the pain located?"     *No Answer* 3. PAIN: "How bad is the pain?" (Scale 1-10; or mild, moderate, severe)   - MILD (1-3): doesn't interfere with normal activities.   - MODERATE (4-7): interferes with normal activities (e.g., work or school) or awakens from sleep.   - SEVERE (8-10): excruciating pain, unable to do any normal activities, unable to use arm at all.     *No Answer* 4. WORK OR EXERCISE: "Has there been any recent work or exercise that involved this part of the body?"     *No Answer* 5. CAUSE: "What do you think is causing the elbow pain?"     *No Answer* 6. OTHER SYMPTOMS: "Do you have any other symptoms?" (e.g., neck pain, elbow swelling, rash, fever)     *No Answer* 7. PREGNANCY: "Is there any chance you are pregnant?" "When was your last menstrual period?"     *No Answer*  Protocols used: ELBOW PAIN-A-AH

## 2020-04-13 NOTE — Assessment & Plan Note (Signed)
Patient with bilateral acute on chronic knee pain localizing to the joint lines in the setting of tricompartmental osteoarthritis as per radiographic review and status post bilateral arthroscopies.  Denies any specific onset of symptoms tied to specific events, pain is worse on the right compared to left, medial greater than lateral joint lines where there is tenderness, resisted knee extension on right elicits anterior knee pain, left is benign.  I reviewed various surgical and nonsurgical treatment strategies and given her objective osteoarthritis and findings today she is amenable to proceed with viscosupplementation.  We will have our clinic coordinate the same and have her return once scheduled.

## 2020-04-13 NOTE — Progress Notes (Signed)
New Patient Office Visit  Subjective:  Patient ID: Theresa Lester, female    DOB: 07/15/1970  Age: 50 y.o. MRN: [id number]  CC:  Chief Complaint  Patient presents with  . New Patient (Initial Visit)  . 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 Knee Pain    Bilateral; history of bilateral knee arthroscopy medial menisectomy, left knee 2019, right knee 2020; 8/10 pain    HPI Theresa Lester presents for left elbow and bilateral (right greater than left) knee pain, details per HPI  Past Medical History:  Diagnosis Date  . Acoustic neuroma ([place])   . Arthralgia of hip or thigh 10/19/2014  . Blood glucose elevated 10/19/2014   A1C 5.7 [08/2013]   . Calculus of gallbladder   . Encounter for screening colonoscopy   . Fast heart beat 10/19/2014   TFTs normal [02/2014]   . GERD (gastroesophageal reflux disease)   . Hemorrhoids, internal   . Left axillary hidradenitis 11/05/2015  . Restless leg   . Tinnitus 11/25/2014    Past Surgical History:  Procedure Laterality Date  . BILATERAL CARPAL TUNNEL RELEASE    . COLONOSCOPY WITH PROPOFOL N/A 11/01/2018   Procedure: COLONOSCOPY WITH BIOPSY;  Surgeon: [name], MD;  Location: [facility];  Service: Endoscopy;  Laterality: N/A;  . KNEE ARTHROSCOPY WITH MEDIAL MENISECTOMY Left 01/18/2017   Procedure: KNEE ARTHROSCOPY WITH MEDIAL MENISECTOMY ROOT REPAIR CHONDROPLASTY;  Surgeon: Leim Fabry, MD;  Location: [facility];  Service: Orthopedics;  Laterality: Left;  SUPINE WITH ACL LEG HOLDER ARTHREX ALEX  FLIP CUTTER, MENISCUS ROOT GUIDE STRYKER [place] CETERIX AIR  ANESTHESIA: ADDUCTOR CANAL NERVE BLOCK  . KNEE ARTHROSCOPY WITH MEDIAL MENISECTOMY Right 07/16/2018   Procedure: KNEE ARTHROSCOPY WITH MEDIAL MENISCUS  ROOT REPAIR;  Surgeon: Leim Fabry, MD;  Location: [facility];   Service: Orthopedics;  Laterality: Right;  MENSICUS ROOT GUIDE KIT + SWIVELOCKS,  SWIVELOCK TIBIAL FIXATION CETERIX OPEN KNEE TRAY BOVIE ELECTROCAUTERY  . POLYPECTOMY N/A 11/01/2018   Procedure: POLYPECTOMY;  Surgeon: [name], MD;  Location: [facility];  Service: Endoscopy;  Laterality: N/A;  . TUBAL LIGATION    . VESTIBULAR NERVE SECTION Right 2017    Family History  Problem Relation Age of Onset  . Hypertension Mother   . Diabetes Mother   . Breast cancer Neg Hx     Social History   Socioeconomic History  . Marital status: Married    Spouse name: Gwynevere Lizana  . Number of children: 4  . Years of education: 32  . Highest education level: High school graduate  Occupational History  . Occupation: CNA    Comment: Caregiver  Tobacco Use  . Smoking status: Never Smoker  . Smokeless tobacco: Never Used  Vaping Use  . Vaping Use: Never used  Substance and Sexual Activity  . Alcohol use: Not Currently    Alcohol/week: 0.0 standard drinks  . Drug use: Never  . Sexual activity: Yes  Other Topics Concern  . Not on file  Social History Narrative  . Not on file   Social Determinants of Health   Financial Resource Strain: Not on file  Food Insecurity: Not on file  Transportation Needs: Not on file  Physical Activity: Not on file  Stress: Not on file  Social Connections: Not on file  Intimate Partner  Violence: Not on file    ROS Review of Systems per HPI and A&P  Objective:   Today's Vitals: BP 108/68 (BP Location: Right Arm, Patient Position: Sitting, Cuff Size: Large)   Pulse 87   Temp 98.2 F (36.8 C) (Oral)   Ht 5' 3"  (1.6 m)   Wt 200 lb 3.2 oz (90.8 kg)   LMP 03/16/2020 (Approximate)   SpO2 99%   BMI 35.46 kg/m   Physical Exam per A&P  Independent interpretation of right knee x-ray from [facility] radiology dated 06/13/2018 reveals tricompartmental osteoarthritis, maximal focality at the medial tibiofemoral junction, secondarily to the  patellofemoral junction, no acute osseous process noted  Procedure:  Injection of left lateral epicondyle under ultrasound guidance Consent obtained and verified. Time-out conducted. Noted no overlying erythema, induration, or other signs of local infection. Skin prepped in a sterile fashion. Topical analgesic spray: Ethyl chloride. Completed without difficulty. Meds: Triamcinolone 40 mg/mL, 1 mL, lidocaine 1% without epinephrine Advised to call if fevers/chills, erythema, induration, drainage, or persistent bleeding.     Assessment & Plan:   Problem List Items Addressed This Visit      Musculoskeletal and Integument   Tricompartment osteoarthritis of left knee   Lateral epicondylitis, left elbow - Primary    Right-hand-dominant patient with 1 month history of atraumatic left lateral elbow pain focal to the lateral epicondyle where she has tenderness, provocative testing is consistent with isolated lateral epicondylitis.  Treatment strategies were reviewed and she did elect to proceed with ultrasound-guided corticosteroid injection.  Postinjection care outlined.  Home-based rehab program discussed and we will coordinate a follow-up to review symptoms.  If suboptimal progress, anticipate initial plain films and possible advanced imaging, escalation of pharmacotherapy, all as clinically guided.      Tricompartment osteoarthritis of right knee    Patient with bilateral acute on chronic knee pain localizing to the joint lines in the setting of tricompartmental osteoarthritis as per radiographic review and status post bilateral arthroscopies.  Denies any specific onset of symptoms tied to specific events, pain is worse on the right compared to left, medial greater than lateral joint lines where there is tenderness, resisted knee extension on right elicits anterior knee pain, left is benign.  I reviewed various surgical and nonsurgical treatment strategies and given her objective osteoarthritis  and findings today she is amenable to proceed with viscosupplementation.  We will have our clinic coordinate the same and have her return once scheduled.         Outpatient Encounter Medications as of 04/13/2020  Medication Sig  . Budesonide (PULMICORT FLEXHALER) 90 MCG/ACT inhaler Inhale 1 puff into the lungs 2 (two) times daily.  . carbamazepine (TEGRETOL XR) 100 MG 12 hr tablet Take 100 mg by mouth daily.  Marland Kitchen gabapentin (NEURONTIN) 100 MG capsule Take 1 capsule (100 mg total) by mouth every evening. (Patient taking differently: Take 100 mg by mouth every evening.)  . ibuprofen (ADVIL) 800 MG tablet Take 800 mg by mouth every 8 (eight) hours as needed for moderate pain.  . pramoxine-hydrocortisone (PROCTOCREAM-HC) 1-1 % rectal cream Place 1 application rectally 2 (two) times daily.  . rifampin (RIFADIN) 300 MG capsule Take 2 capsules (600 mg total) by mouth daily. (Patient taking differently: Take 600 mg by mouth daily.)   No facility-administered encounter medications on file as of 04/13/2020.    Follow-up: Return We will contact to schedule pending authorization for knee injections.   Montel Culver, MD

## 2020-04-13 NOTE — Patient Instructions (Signed)
-You received a cortisone injection to your left elbow today -Take relative rest over the next game, apply ice over a towel at 20-minute intervals as needed, and can dose over-the-counter pain medications on an as-needed basis if necessary -Start and gradually advance home exercises with information provided below -We will contact you to coordinate follow-up once we can schedule knee injections as well -Contact us for any questions between now and then  Tennis Elbow Exercises  Your healthcare provider may recommend exercises to help you heal. Talk to your healthcare provider or physical therapist about which exercises will best help you and how to do them correctly and safely. You may do the stretching exercises right away. You may do the strengthening exercises when stretching is nearly painless. Stretching exercises . Wrist active range of motion, flexion and extension: Bend the wrist of your injured arm forward and back as far as you can. Do 2 sets of 15. . Wrist stretch: Press the back of the hand on your injured side with your other hand to help bend your wrist. Hold for 15 to 30 seconds. Next, stretch the hand back by pressing the fingers in a backward direction. Hold for 15 to 30 seconds. Keep the arm on your injured side straight during this exercise. Do 3 sets. . Forearm pronation and supination: Bend the elbow of your injured arm 90 degrees, keeping your elbow at your side. Turn your palm up and hold for 5 seconds. Then slowly turn your palm down and hold for 5 seconds. Make sure you keep your elbow at your side and bent 90 degrees while you do the exercise. Do 2 sets of 15. Marland Kitchen Active elbow flexion and extension: Gently bring the palm of the hand on your injured side up toward your shoulder, bending your elbow as much as you can. Then straighten your elbow as far as you can. Repeat 15 times. Do 2 sets of 15. Strengthening exercises . Mid-trap exercise: Lie on your stomach on a firm surface  and place a folded pillow underneath your chest. Place your arms out straight to your sides with your elbows straight and thumbs toward the ceiling. Slowly raise your arms toward the ceiling as you squeeze your shoulder blades together. Lower slowly. Do 3 sets of 15. As the exercise gets easier to do, hold soup cans or small weights in your hands. . Eccentric wrist flexion: Hold a can or hammer handle in the hand of your injured side with your palm up. Use the hand on the side that is not injured to bend your wrist up. Then let go of your wrist and use just your injured side to lower the weight slowly back to the starting position. Do 3 sets of 15. Gradually increase the weight you are holding. . Eccentric wrist extension: Hold a soup can or hammer handle in the hand of your injured side with your palm facing down. Use the hand on the side that is not injured to bend your wrist up. Then let go of your wrist and use just your injured side to lower the weight slowly back to the starting position. Do 3 sets of 15. Gradually increase the weight you are holding. . Wrist radial deviation strengthening: Put your wrist in the sideways position with your thumb up. Hold a can of soup or a hammer handle and gently bend your wrist up, with the thumb reaching toward the ceiling. Slowly lower to the starting position. Do not move your forearm throughout this  exercise. Do 2 sets of 15. . Forearm pronation and supination strengthening: Hold a soup can or hammer handle in your hand and bend your elbow 90 degrees. Slowly turn your hand so your palm is up and then down. Do 2 sets of 15. . Wrist extension with broom handle: Stand up and hold a broom handle in both hands. With your arms at shoulder level, elbows straight and palms down, roll the broom handle backward in your hand. Do 2 sets of 15.

## 2020-04-13 NOTE — Assessment & Plan Note (Signed)
>>  ASSESSMENT AND PLAN FOR TRICOMPARTMENT OSTEOARTHRITIS OF RIGHT KNEE WRITTEN ON 04/13/2020 10:35 AM BY MATTHEWS, JASON J, MD  Patient with bilateral acute on chronic knee pain localizing to the joint lines in the setting of tricompartmental osteoarthritis as per radiographic review and status post bilateral arthroscopies.  Denies any specific onset of symptoms tied to specific events, pain is worse on the right compared to left, medial greater than lateral joint lines where there is tenderness, resisted knee extension on right elicits anterior knee pain, left is benign.  I reviewed various surgical and nonsurgical treatment strategies and given her objective osteoarthritis and findings today she is amenable to proceed with viscosupplementation.  We will have our clinic coordinate the same and have her return once scheduled.

## 2020-04-13 NOTE — Telephone Encounter (Signed)
Will start working on coordinating gel injections tomorrow.

## 2020-04-14 ENCOUNTER — Encounter: Payer: Self-pay | Admitting: Family Medicine

## 2020-04-14 NOTE — Telephone Encounter (Signed)
Pending brand of gel injections we can get approval for and then will work on prior authorization.

## 2020-04-14 NOTE — Telephone Encounter (Signed)
Please advise for pain.  See notes below.

## 2020-04-14 NOTE — Telephone Encounter (Signed)
Spoke to patient at length regarding symptoms post-injection. I did review the expected timeline for symptom response again and measures to address symptoms over interim - specifically ice x 20 minutes over towel on the hour if symptomatic and if not, just prior to bed, to dose ibuprofen 800 mg with meal every 8 hours on an as-needed basis, to limit activity with left arm to simple and necessary daily tasks, and to hold from work tonight. She can return to work tomorrow. She was advised to contact us for any questions or concerns.

## 2020-04-21 NOTE — Telephone Encounter (Signed)
Hi Almyra Free, Are you able to help Dr. Zigmund Daniel and I get Orthovisc gel injections supplied to our office for this patient?

## 2020-04-21 NOTE — Telephone Encounter (Signed)
Hi Britny,   Unfortunately, drug procurement for office use is not something I can help with. This is probably a question for Amy.

## 2020-04-22 ENCOUNTER — Telehealth: Payer: Self-pay

## 2020-04-22 NOTE — Telephone Encounter (Signed)
Completed PA for Orthovisc 30mg /75mL.  120mg  per 30 days.  PA# [id number]  Patient tried and failed Ibuprofen, Naproxen, Tylenol, and Toradol.  Patient has prior imaging from 06/22/2017 stating she has "tricompartmental degenerative changes w/o acute bone abnormality."  Awaiting for PA to return within 24 hrs.

## 2020-04-23 ENCOUNTER — Telehealth: Payer: Self-pay

## 2020-04-23 ENCOUNTER — Other Ambulatory Visit: Payer: Self-pay

## 2020-04-23 NOTE — Telephone Encounter (Signed)
Called and completed a PA over the phone for Durolane 60mg /57mL injection.   Patient tried and failed ibuprofen, toradol, tylenol, and naproxen.  I was asked during the PA if the patient has tried and failed a kenolog injection into the knee. - Denied this per chart records.  Awaiting PA to come back from patients insurance with response.  PA# [id number]

## 2020-04-23 NOTE — Telephone Encounter (Signed)
PA for Durolane INJ 60mg /61mL was APPROVED under patients list of covered drugs. Will route to Dr. Zigmund Daniel for review.  CM

## 2020-04-23 NOTE — Telephone Encounter (Signed)
-----   Message from Clista Bernhardt, [place] sent at 04/22/2020 10:16 AM EDT ----- Patient is waiting on approval or denial for the gel injections.  Completed PA for Orthovisc 30mg /13mL.  120mg  per 30 days.  PA# [id number]  Patient tried and failed Ibuprofen, Naproxen, Tylenol, and Toradol.  Patient has prior imaging from 06/22/2017 stating she has "tricompartmental degenerative changes w/o acute bone abnormality."  Awaiting for PA to return within 24 hrs.

## 2020-04-23 NOTE — Telephone Encounter (Signed)
PA has been DENIED per insurance.   Insurance said they do DENIED this PA based on patients plan's drug coverage policy for this medication.  CM

## 2020-04-26 NOTE — Telephone Encounter (Signed)
See other telephone encounter.  Patient approved for gel injections.

## 2020-05-04 ENCOUNTER — Other Ambulatory Visit: Payer: Self-pay

## 2020-05-04 DIAGNOSIS — M1711 Unilateral primary osteoarthritis, right knee: Secondary | ICD-10-CM

## 2020-05-04 DIAGNOSIS — M1712 Unilateral primary osteoarthritis, left knee: Secondary | ICD-10-CM

## 2020-05-04 MED ORDER — DUROLANE 60 MG/3ML IX PRSY: 60.0000 mg | PREFILLED_SYRINGE | Freq: Once | INTRA_ARTICULAR | 0 refills | Status: AC

## 2020-05-04 MED ORDER — DUROLANE 60 MG/3ML IX PRSY: 60.0000 mg | PREFILLED_SYRINGE | Freq: Once | INTRA_ARTICULAR | 0 refills | Status: DC

## 2020-05-04 NOTE — Telephone Encounter (Signed)
Prescription for Durolane 60mg /59mL sent to [facility].  Called patient to notify, no answer, left voicemail to return call.

## 2020-05-06 ENCOUNTER — Ambulatory Visit: Payer: Commercial Managed Care - PPO | Admitting: General Surgery

## 2020-05-06 NOTE — Telephone Encounter (Signed)
Spoke with pharmacist Almyra Free with [facility] at [phone] and advised Monovisc 88 mg/73mL right knee was approved at 100% via patient's insurance on 05/04/20 and to discontinue order for Durolane 60 mg/55mL. Verified clinic address for medication to be shipped directly to our office upon patient's approval. Verbalized understanding.    Patient aware she will be contacted by [facility] within 24 hours discussing this determination.  Once our office receives the medication we can set up an office visit.

## 2020-05-07 ENCOUNTER — Other Ambulatory Visit
Admission: RE | Admit: 2020-05-07 | Discharge: 2020-05-07 | Disposition: A | Discharge status: Home / Self Care | Payer: Commercial Managed Care - PPO | Attending: General Surgery | Admitting: General Surgery

## 2020-05-07 ENCOUNTER — Other Ambulatory Visit: Payer: Self-pay

## 2020-05-07 DIAGNOSIS — E041 Nontoxic single thyroid nodule: Secondary | ICD-10-CM | POA: Insufficient documentation

## 2020-05-07 LAB — T4, FREE: Free T4: 0.79 ng/dL (ref 0.61–1.12)

## 2020-05-07 LAB — TSH: TSH: 3.825 u[IU]/mL (ref 0.350–4.500)

## 2020-05-08 LAB — T3, FREE: T3, Free: 4 pg/mL (ref 2.0–4.4)

## 2020-05-11 ENCOUNTER — Telehealth: Payer: Self-pay

## 2020-05-11 NOTE — Telephone Encounter (Signed)
Pt scheduled for 05/19/20

## 2020-05-11 NOTE — Telephone Encounter (Signed)
Unable to schedule patient for injection on the same day as visit with Dr. Army Melia.  Called patient to notify, no answer.  Can schedule patient early next week or can call to schedule Thursday depending on when medication is delivered.

## 2020-05-11 NOTE — Telephone Encounter (Signed)
Please review.  KP

## 2020-05-11 NOTE — Telephone Encounter (Signed)
Copied from [place] [id number]. Topic: General - Other >> May 11, 2020  9:37 AM Leward Quan A wrote: Reason for CRM:  Bim with [facility] to schedule a medication drop off asking for a call back today please at Ph#   [phone]

## 2020-05-11 NOTE — Telephone Encounter (Signed)
Thank you, I will see her then.

## 2020-05-11 NOTE — Telephone Encounter (Signed)
Spoke with Rowi P. with [facility] and Monovisc 88mg /16mL scheduled for delivery on Thursday, 05/13/2020.  Must be signed for upon arrival.  For your information.   Daisey, please schedule patient for injection on Friday after appointment with Dr. Army Melia.

## 2020-05-12 NOTE — Telephone Encounter (Signed)
[facility] states the medication is at [facility], and it is ready to go. But they they have not been able to get in touch with the pt for her consent. hey have been trying to get in touch with her.  [facility] phone number [phone]

## 2020-05-12 NOTE — Telephone Encounter (Signed)
Noted  

## 2020-05-13 ENCOUNTER — Ambulatory Visit (INDEPENDENT_AMBULATORY_CARE_PROVIDER_SITE_OTHER): Payer: Commercial Managed Care - PPO | Admitting: General Surgery

## 2020-05-13 ENCOUNTER — Other Ambulatory Visit: Payer: Self-pay

## 2020-05-13 ENCOUNTER — Encounter: Payer: Self-pay | Admitting: General Surgery

## 2020-05-13 VITALS — BP 142/89 | HR 82 | Temp 97.9°F | Ht 63.0 in | Wt 195.6 lb

## 2020-05-13 DIAGNOSIS — E041 Nontoxic single thyroid nodule: Secondary | ICD-10-CM | POA: Diagnosis not present

## 2020-05-13 MED ORDER — HYDROCORTISONE (PERIANAL) 2.5 % EX CREA: 1.0000 "application " | TOPICAL_CREAM | Freq: Two times a day (BID) | CUTANEOUS | 0 refills | Status: DC

## 2020-05-13 NOTE — Telephone Encounter (Signed)
Left detailed message notifying patient she needs to give consent for medication to be shipped to our office and gave number to call.

## 2020-05-13 NOTE — Progress Notes (Signed)
Patient ID: Theresa Lester, female   DOB: 07/10/1970, 50 y.o.   MRN: [id number]  Chief Complaint  Patient presents with  . Follow-up    Thyroid US    HPI Theresa Lester is a 50 y.o. female.  She is here today for follow-up of bilateral thyroid nodules.  I initially saw her in October 2021 for a thrombosed external hemorrhoid, but it was too late to intervene upon them surgically.  During her physical examination, however, I palpated bilateral thyroid nodules.  I performed an ultrasound and identified a right and left thyroid nodule, neither of which met criteria for biopsy.  Her TSH in August 2021 had been elevated, so I also wanted to recheck her thyroid levels.  She is here today for that evaluation.  She states that since her last visit, she has not noticed any changes in her neck.  She denies heart palpitations or hand tremors.  No voice changes, dysphagia, frequent throat clearing, or pressure sensation in her neck while supine.  No changes in the texture of her hair, skin, or fingernails.  No diarrhea or constipation.  No heat or cold intolerance.  Her weight has been stable.  She is not currently taking any thyroid hormone replacement.   Past Medical History:  Diagnosis Date  . Acoustic neuroma ([place])   . Arthralgia of hip or thigh 10/19/2014  . Blood glucose elevated 10/19/2014   A1C 5.7 [08/2013]   . Calculus of gallbladder   . Encounter for screening colonoscopy   . Fast heart beat 10/19/2014   TFTs normal [02/2014]   . GERD (gastroesophageal reflux disease)   . Hemorrhoids, internal   . Left axillary hidradenitis 11/05/2015  . Restless leg   . Tinnitus 11/25/2014    Past Surgical History:  Procedure Laterality Date  . BILATERAL CARPAL TUNNEL RELEASE    . COLONOSCOPY WITH PROPOFOL N/A 11/01/2018   Procedure: COLONOSCOPY WITH BIOPSY;  Surgeon: Theresa Lame, MD;  Location: [facility];  Service: Endoscopy;  Laterality: N/A;  . KNEE ARTHROSCOPY WITH MEDIAL  MENISECTOMY Left 01/18/2017   Procedure: KNEE ARTHROSCOPY WITH MEDIAL MENISECTOMY ROOT REPAIR CHONDROPLASTY;  Surgeon: Leim Fabry, MD;  Location: [facility];  Service: Orthopedics;  Laterality: Left;  SUPINE WITH ACL LEG HOLDER ARTHREX ALEX  FLIP CUTTER, MENISCUS ROOT GUIDE STRYKER [place] CETERIX AIR  ANESTHESIA: ADDUCTOR CANAL NERVE BLOCK  . KNEE ARTHROSCOPY WITH MEDIAL MENISECTOMY Right 07/16/2018   Procedure: KNEE ARTHROSCOPY WITH MEDIAL MENISCUS  ROOT REPAIR;  Surgeon: Leim Fabry, MD;  Location: [facility];  Service: Orthopedics;  Laterality: Right;  MENSICUS ROOT GUIDE KIT + SWIVELOCKS,  SWIVELOCK TIBIAL FIXATION CETERIX OPEN KNEE TRAY BOVIE ELECTROCAUTERY  . POLYPECTOMY N/A 11/01/2018   Procedure: POLYPECTOMY;  Surgeon: Theresa Lame, MD;  Location: [facility];  Service: Endoscopy;  Laterality: N/A;  . TUBAL LIGATION    . VESTIBULAR NERVE SECTION Right 2017    Family History  Problem Relation Age of Onset  . Hypertension Mother   . Diabetes Mother   . Breast cancer Neg Hx     Social History Social History   Tobacco Use  . Smoking status: Never Smoker  . Smokeless tobacco: Never Used  Vaping Use  . Vaping Use: Never used  Substance Use Topics  . Alcohol use: Not Currently    Alcohol/week: 0.0 standard drinks  . Drug use: Never    No Known Allergies  Current Outpatient Medications  Medication Sig Dispense Refill  . Budesonide (PULMICORT  FLEXHALER) 90 MCG/ACT inhaler Inhale 1 puff into the lungs 2 (two) times daily. 1 each 5  . carbamazepine (TEGRETOL XR) 100 MG 12 hr tablet Take 100 mg by mouth daily.    Marland Kitchen gabapentin (NEURONTIN) 100 MG capsule Take 1 capsule (100 mg total) by mouth every evening. (Patient taking differently: Take 100 mg by mouth every evening.) 90 capsule 0  . ibuprofen (ADVIL) 800 MG tablet Take 800 mg by mouth every 8 (eight) hours as needed for moderate pain.    . pramoxine-hydrocortisone (PROCTOCREAM-HC) 1-1 % rectal cream  Place 1 application rectally 2 (two) times daily. 30 g 0  . rifampin (RIFADIN) 300 MG capsule Take 2 capsules (600 mg total) by mouth daily. (Patient taking differently: Take 600 mg by mouth daily.) 60 capsule 3   No current facility-administered medications for this visit.    Review of Systems Review of Systems  All other systems reviewed and are negative. Or as discussed in the history of present illness.  Blood pressure (!) 142/89, pulse 82, temperature 97.9 F (36.6 C), temperature source Oral, height 5' 3" (1.6 m), weight 195 lb 9.6 oz (88.7 kg), last menstrual period 05/13/2020, SpO2 97 %. Body mass index is 34.65 kg/m.  Physical Exam Physical Exam Constitutional:      General: She is not in acute distress.    Appearance: Normal appearance. She is obese.  HENT:     Head: Normocephalic and atraumatic.     Nose:     Comments: Covered with a mask    Mouth/Throat:     Comments: Covered with a mask Eyes:     General: No scleral icterus.       Right eye: No discharge.        Left eye: No discharge.     Comments: No proptosis or exophthalmos.  No lid lag or stare.  Neck:     Comments: No palpable cervical or supraclavicular lymphadenopathy.  The trachea is midline.  Bilateral thyroid nodules are appreciated.  On palpation, they are smooth contoured and rubbery.  Each feels to be roughly 2 cm. Cardiovascular:     Rate and Rhythm: Normal rate and regular rhythm.  Pulmonary:     Effort: Pulmonary effort is normal.     Breath sounds: Normal breath sounds.  Abdominal:     General: Bowel sounds are normal.     Palpations: Abdomen is soft.  Genitourinary:    Comments: Deferred Musculoskeletal:        General: No swelling or tenderness.     Cervical back: Neck supple. No rigidity or tenderness.  Skin:    General: Skin is warm and dry.  Neurological:     General: No focal deficit present.     Mental Status: She is alert and oriented to person, place, and time.  Psychiatric:         Mood and Affect: Mood normal.        Behavior: Behavior normal.     Data Reviewed Results for Theresa, Lester (MRN [id number]) as of 05/13/2020 09:43  Ref. Range 09/13/2018 11:15 01/19/2020 11:20 05/07/2020 07:39  TSH Latest Ref Range: 0.350 - 4.500 uIU/mL 5.790 (H) 2.395 3.825  Triiodothyronine,Free,Serum Latest Ref Range: 2.0 - 4.4 pg/mL   4.0  T4,Free(Direct) Latest Ref Range: 0.61 - 1.12 ng/dL   0.79   Her current thyroid function is normal.  I reviewed my previous images and have copied relevant data here:       Assessment and Plan  This is a 50 year old woman with a multinodular goiter.  She is completely asymptomatic and her thyroid profile has improved since I saw her last.  I performed a bedside ultrasound in clinic today, using the GE LOGIQ machine and a 12 MHz linear array transducer.  Due to some technical challenges, the images were not able to be pushed into the electronic medical record.  Comparison between prior images and today's, however suggest complete stability of the nodules.  None of them meet criteria for biopsy.  I do not think she needs structured surveillance, but would certainly consider a repeat ultrasound in 1 to 2 years just to monitor for any changes or growth, or if she begins to notice any compressive symptoms.  In addition, her thyroid function does seem to fluctuate and I would recommend monitoring this and initiating thyroid hormone replacement if she returns to hypothyroid state  Ms. Pensinger also asked me about her hemorrhoidal disease.  She states that while the pain has resolved, she does continue to have some itching and wondered if there is anything that could be done to address this.  We have prescribed Anusol ointment for her to try.  I did mention the possibility of surgery, but given her limited disease, it seems that conservative measures may be more beneficial at this time.  She will contact us if she fails to have improvement with the  corticosteroid applications.   Fredirick Maudlin 05/13/2020, 9:51 AM

## 2020-05-13 NOTE — Telephone Encounter (Signed)
Received Monovisc medication today.  Injection scheduled next week.  Left patient a message to disregard calling [facility] for confirmation.  For your information.

## 2020-05-13 NOTE — Patient Instructions (Addendum)
[facility]. If you have any concerns or questions, please feel free to call our clinic.

## 2020-05-14 ENCOUNTER — Encounter: Payer: Self-pay | Admitting: Internal Medicine

## 2020-05-14 ENCOUNTER — Ambulatory Visit (INDEPENDENT_AMBULATORY_CARE_PROVIDER_SITE_OTHER): Payer: Commercial Managed Care - PPO | Admitting: Internal Medicine

## 2020-05-14 ENCOUNTER — Other Ambulatory Visit: Payer: Self-pay

## 2020-05-14 VITALS — BP 124/84 | HR 90 | Temp 98.3°F | Ht 63.0 in | Wt 196.0 lb

## 2020-05-14 DIAGNOSIS — M62838 Other muscle spasm: Secondary | ICD-10-CM | POA: Diagnosis not present

## 2020-05-14 NOTE — Patient Instructions (Addendum)
Calcium 1000-1200 mg per day  Vitamin D3 400-800 IU per day

## 2020-05-14 NOTE — Progress Notes (Signed)
Date:  05/14/2020   Name:  Theresa Lester   DOB:  1970/06/14   MRN:  [id number]   Chief Complaint: Spasms (Patient c/o of twitching of muscle in Rt arm. Started 1 month ago. " Scared I have parkinson's disease. ")  HPI  Muscle spasm - gets twitched in right fore arm every AM.  It causes her hand to tremble and she is concerned about Parkinson's disease.  No family hx of PD. Last calcium was low.  K+ was borderline low. She is right handed.  Works as a Hydrographic surveyor.  Lab Results  Component Value Date   CREATININE 0.82 01/19/2020   BUN 14 01/19/2020   NA 138 01/19/2020   K 3.9 01/19/2020   CL 101 01/19/2020   CO2 28 01/19/2020   Lab Results  Component Value Date   CHOL 163 01/19/2020   HDL 63 01/19/2020   LDLCALC 74 01/19/2020   TRIG 128 01/19/2020   CHOLHDL 2.6 01/19/2020   Lab Results  Component Value Date   TSH 3.825 05/07/2020   No results found for: HGBA1C Lab Results  Component Value Date   WBC 5.8 01/19/2020   HGB 12.5 01/19/2020   HCT 39.5 01/19/2020   MCV 84.0 01/19/2020   PLT 295 01/19/2020   Lab Results  Component Value Date   ALT 18 01/19/2020   AST 19 01/19/2020   ALKPHOS 68 01/19/2020   BILITOT 0.4 01/19/2020     Review of Systems  Constitutional: Negative for chills, fatigue and fever.  Eyes: Negative for visual disturbance.  Respiratory: Negative for chest tightness and shortness of breath.   Cardiovascular: Negative for chest pain.  Musculoskeletal: Positive for arthralgias and myalgias.  Neurological: Negative for dizziness and headaches.  Psychiatric/Behavioral: Negative for dysphoric mood and sleep disturbance. The patient is not nervous/anxious.     Patient Active Problem List   Diagnosis Date Noted  . Lateral epicondylitis, left elbow 04/13/2020  . Tricompartment osteoarthritis of right knee 04/13/2020  . 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 02/03/2020  . Mild persistent asthma without complication 01/08/8249  . Asthma  in adult without complication 03/70/4888  . Benign neoplasm of cecum   . Immunization, BCG 03/01/2018  . Tricompartment osteoarthritis of left knee 10/11/2016  . Hemifacial spasm   . TIA (transient ischemic attack) 06/11/2015  . Episodic tension-type headache, not intractable 04/14/2015  . Angioneurotic edema 10/19/2014  . Calculus of gallbladder 10/19/2014  . Acid reflux 10/19/2014  . Hemorrhoids, internal 10/19/2014  . Restless leg syndrome 10/19/2014    No Known Allergies  Past Surgical History:  Procedure Laterality Date  . BILATERAL CARPAL TUNNEL RELEASE    . COLONOSCOPY WITH PROPOFOL N/A 11/01/2018   Procedure: COLONOSCOPY WITH BIOPSY;  Surgeon: [name], MD;  Location: [facility];  Service: Endoscopy;  Laterality: N/A;  . KNEE ARTHROSCOPY WITH MEDIAL MENISECTOMY Left 01/18/2017   Procedure: KNEE ARTHROSCOPY WITH MEDIAL MENISECTOMY ROOT REPAIR CHONDROPLASTY;  Surgeon: Leim Fabry, MD;  Location: [facility];  Service: Orthopedics;  Laterality: Left;  SUPINE WITH ACL LEG HOLDER ARTHREX ALEX  FLIP CUTTER, MENISCUS ROOT GUIDE STRYKER [place] CETERIX AIR  ANESTHESIA: ADDUCTOR CANAL NERVE BLOCK  . KNEE ARTHROSCOPY WITH MEDIAL MENISECTOMY Right 07/16/2018   Procedure: KNEE ARTHROSCOPY WITH MEDIAL MENISCUS  ROOT REPAIR;  Surgeon: Leim Fabry, MD;  Location: [facility];  Service: Orthopedics;  Laterality: Right;  MENSICUS ROOT GUIDE KIT + SWIVELOCKS,  SWIVELOCK TIBIAL FIXATION CETERIX OPEN KNEE TRAY BOVIE ELECTROCAUTERY  .  POLYPECTOMY N/A 11/01/2018   Procedure: POLYPECTOMY;  Surgeon: [name], MD;  Location: [facility];  Service: Endoscopy;  Laterality: N/A;  . TUBAL LIGATION    . VESTIBULAR NERVE SECTION Right 2017    Social History   Tobacco Use  . Smoking status: Never Smoker  . Smokeless tobacco: Never Used  Vaping Use  . Vaping Use: Never used  Substance Use Topics  . Alcohol use: Not Currently    Alcohol/week: 0.0 standard  drinks  . Drug use: Never     Medication list has been reviewed and updated.  Current Meds  Medication Sig  . Budesonide (PULMICORT FLEXHALER) 90 MCG/ACT inhaler Inhale 1 puff into the lungs 2 (two) times daily.  . carbamazepine (TEGRETOL XR) 100 MG 12 hr tablet Take 100 mg by mouth daily.  Marland Kitchen gabapentin (NEURONTIN) 100 MG capsule Take 1 capsule (100 mg total) by mouth every evening. (Patient taking differently: Take 100 mg by mouth every evening.)  . hydrocortisone (ANUSOL-HC) 2.5 % rectal cream Place 1 application rectally 2 (two) times daily.  Marland Kitchen ibuprofen (ADVIL) 800 MG tablet Take 800 mg by mouth every 8 (eight) hours as needed for moderate pain.  . pramoxine-hydrocortisone (PROCTOCREAM-HC) 1-1 % rectal cream Place 1 application rectally 2 (two) times daily.  . rifampin (RIFADIN) 300 MG capsule Take 2 capsules (600 mg total) by mouth daily. (Patient taking differently: Take 600 mg by mouth daily.)    PHQ 2/9 Scores 05/14/2020 04/13/2020 01/19/2020 11/03/2019  PHQ - 2 Score 0 0 0 0  PHQ- 9 Score 0 0 0 0    GAD 7 : Generalized Anxiety Score 05/14/2020 04/13/2020 01/19/2020 11/03/2019  Nervous, Anxious, on Edge 0 0 0 0  Control/stop worrying 0 0 0 0  Worry too much - different things 0 0 0 0  Trouble relaxing 0 0 0 0  Restless 0 0 0 0  Easily annoyed or irritable 0 0 0 0  Afraid - awful might happen 0 0 0 0  Total GAD 7 Score 0 0 0 0  Anxiety Difficulty Not difficult at all - - Not difficult at all    BP Readings from Last 3 Encounters:  05/14/20 124/84  05/13/20 (!) 142/89  04/13/20 108/68    Physical Exam Vitals and nursing note reviewed.  Constitutional:      General: She is not in acute distress.    Appearance: Normal appearance. She is well-developed.  HENT:     Head: Normocephalic and atraumatic.  Cardiovascular:     Rate and Rhythm: Normal rate and regular rhythm.     Pulses: Normal pulses.     Heart sounds: No gallop.   Pulmonary:     Effort: Pulmonary effort is  normal. No respiratory distress.     Breath sounds: No wheezing or rhonchi.  Musculoskeletal:     Right forearm: Normal.     Left forearm: Normal.     Right wrist: Normal.     Left wrist: Normal.     Right hand: Normal.     Left hand: Normal.     Cervical back: Normal range of motion.  Lymphadenopathy:     Cervical: No cervical adenopathy.  Skin:    General: Skin is warm and dry.     Findings: No rash.  Neurological:     Mental Status: She is alert and oriented to person, place, and time.  Psychiatric:        Mood and Affect: Mood normal.  Behavior: Behavior normal.     Wt Readings from Last 3 Encounters:  05/14/20 196 lb (88.9 kg)  05/13/20 195 lb 9.6 oz (88.7 kg)  04/13/20 200 lb 3.2 oz (90.8 kg)    BP 124/84   Pulse 90   Temp 98.3 F (36.8 C) (Oral)   Ht 5' 3"  (1.6 m)   Wt 196 lb (88.9 kg)   LMP 05/13/2020   SpO2 98%   BMI 34.72 kg/m   Assessment and Plan: 1. Muscle spasm Patient is reassured  Will check K+; continue gatorade - Basic metabolic panel  2. Hypocalcemia Begin calcium and vitamin D daily - Basic metabolic panel   Partially dictated using Editor, commissioning. Any errors are unintentional.  Halina Maidens, MD [facility]  05/14/2020

## 2020-05-15 LAB — BASIC METABOLIC PANEL
BUN/Creatinine Ratio: 19 (ref 9–23)
BUN: 12 mg/dL (ref 6–24)
CO2: 22 mmol/L (ref 20–29)
Calcium: 8.6 mg/dL — ABNORMAL LOW (ref 8.7–10.2)
Chloride: 102 mmol/L (ref 96–106)
Creatinine, Ser: 0.63 mg/dL (ref 0.57–1.00)
Glucose: 118 mg/dL — ABNORMAL HIGH (ref 65–99)
Potassium: 4.5 mmol/L (ref 3.5–5.2)
Sodium: 139 mmol/L (ref 134–144)
eGFR: 107 mL/min/{1.73_m2} (ref >59)

## 2020-05-17 ENCOUNTER — Telehealth: Payer: Self-pay | Admitting: Internal Medicine

## 2020-05-17 NOTE — Telephone Encounter (Signed)
Pt called for lab results. Please return call. Ok per message below.  Copied from [place] [id number]. Topic: Quick Communication - Lab Results (Clinic Use ONLY) >> May 17, 2020  8:53 AM Person, Ival Bible, CMA wrote: Called patient to inform them of recent lab results. When patient returns call, triage nurse may disclose results.

## 2020-05-17 NOTE — Telephone Encounter (Signed)
Call to patient- see lab note.

## 2020-05-19 ENCOUNTER — Ambulatory Visit: Payer: Commercial Managed Care - PPO | Admitting: Family Medicine

## 2020-05-21 ENCOUNTER — Ambulatory Visit: Payer: Commercial Managed Care - PPO | Admitting: Family Medicine

## 2020-05-28 ENCOUNTER — Ambulatory Visit: Payer: Commercial Managed Care - PPO | Admitting: Family Medicine

## 2020-06-01 ENCOUNTER — Encounter: Payer: Self-pay | Admitting: Internal Medicine

## 2020-06-04 ENCOUNTER — Ambulatory Visit: Payer: Commercial Managed Care - PPO | Admitting: Family Medicine

## 2020-06-11 ENCOUNTER — Other Ambulatory Visit: Payer: Self-pay

## 2020-06-11 ENCOUNTER — Ambulatory Visit: Payer: Commercial Managed Care - PPO | Admitting: Family Medicine

## 2020-06-11 ENCOUNTER — Encounter: Payer: Self-pay | Admitting: Family Medicine

## 2020-06-11 VITALS — BP 124/78 | HR 89 | Temp 98.1°F | Ht 63.0 in | Wt 200.0 lb

## 2020-06-11 DIAGNOSIS — M1711 Unilateral primary osteoarthritis, right knee: Secondary | ICD-10-CM

## 2020-06-11 MED ORDER — HYALURONAN 88 MG/4ML IX SOSY
88.0000 mg | PREFILLED_SYRINGE | Freq: Once | INTRA_ARTICULAR | Status: AC
  Administered 2020-06-11: 88 mg via INTRA_ARTICULAR

## 2020-06-11 NOTE — Assessment & Plan Note (Signed)
>>  ASSESSMENT AND PLAN FOR TRICOMPARTMENT OSTEOARTHRITIS OF RIGHT KNEE WRITTEN ON 06/11/2020  9:47 AM BY MATTHEWS, JASON J, MD  Patient tolerated ultrasound guided intraarticular injection of Monovisc today, post-care reviewed. She can repeat this in 6 months if indicated.

## 2020-06-11 NOTE — Addendum Note (Signed)
Addended by: Forbes Cellar on: 06/11/2020 09:53 AM   Modules accepted: Orders

## 2020-06-11 NOTE — Assessment & Plan Note (Signed)
Patient tolerated ultrasound guided intraarticular injection of Monovisc today, post-care reviewed. She can repeat this in 6 months if indicated.

## 2020-06-11 NOTE — Progress Notes (Signed)
Primary Care / Sports Medicine Office Visit  Patient Information:  Patient ID: Neveah Bang, female DOB: 12/23/1970 Age: 50 y.o. MRN: [id number]   Malavika Lira is a pleasant 50 y.o. female presenting with the following:  Chief Complaint  Patient presents with  . Injections    Monovisc 81m/4mL once into the right knee; pre-procedure pain score: 0    Review of Systems pertinent details above   Patient Active Problem List   Diagnosis Date Noted  . Lateral epicondylitis, left elbow 04/13/2020  . Tricompartment osteoarthritis of right knee 04/13/2020  . 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 02/03/2020  . Mild persistent asthma without complication 080/99/8338 . Asthma in adult without complication 025/05/3974 . Benign neoplasm of cecum   . Immunization, BCG 03/01/2018  . Tricompartment osteoarthritis of left knee 10/11/2016  . Hemifacial spasm   . TIA (transient ischemic attack) 06/11/2015  . Episodic tension-type headache, not intractable 04/14/2015  . Angioneurotic edema 10/19/2014  . Calculus of gallbladder 10/19/2014  . Acid reflux 10/19/2014  . Hemorrhoids, internal 10/19/2014  . Restless leg syndrome 10/19/2014   Past Medical History:  Diagnosis Date  . [place]   . Arthralgia of hip or thigh 10/19/2014  . Blood glucose elevated 10/19/2014   A1C 5.7 [08/2013]   . Calculus of gallbladder   . Encounter for screening colonoscopy   . Fast heart beat 10/19/2014   TFTs normal [02/2014]   . GERD (gastroesophageal reflux disease)   . Hemorrhoids, internal   . Left axillary hidradenitis 11/05/2015  . Restless leg   . Tinnitus 11/25/2014  . Tuberculosis    Outpatient Medications Prior to Visit  Medication Sig Dispense Refill  . Budesonide (PULMICORT FLEXHALER) 90 MCG/ACT inhaler Inhale 1 puff into the lungs 2 (two) times daily. 1 each 5  . carbamazepine (TEGRETOL XR) 100 MG 12 hr tablet Take 100 mg by mouth daily.    .Marland Kitchengabapentin (NEURONTIN)  100 MG capsule Take 1 capsule (100 mg total) by mouth every evening. (Patient taking differently: Take 100 mg by mouth every evening.) 90 capsule 0  . hydrocortisone (ANUSOL-HC) 2.5 % rectal cream Place 1 application rectally 2 (two) times daily. 28 g 0  . ibuprofen (ADVIL) 800 MG tablet Take 800 mg by mouth every 8 (eight) hours as needed for moderate pain.    . pramoxine-hydrocortisone (PROCTOCREAM-HC) 1-1 % rectal cream Place 1 application rectally 2 (two) times daily. 30 g 0  . rifampin (RIFADIN) 300 MG capsule Take 2 capsules (600 mg total) by mouth daily. (Patient taking differently: Take 600 mg by mouth daily.) 60 capsule 3   No facility-administered medications prior to visit.    Past Surgical History:  Procedure Laterality Date  . BILATERAL CARPAL TUNNEL RELEASE    . COLONOSCOPY WITH PROPOFOL N/A 11/01/2018   Procedure: COLONOSCOPY WITH BIOPSY;  Surgeon: WLucilla Lame MD;  [place] [facility]  Service: Endoscopy;  Laterality: N/A;  . KNEE ARTHROSCOPY WITH MEDIAL MENISECTOMY Left 01/18/2017   Procedure: KNEE ARTHROSCOPY WITH MEDIAL MENISECTOMY ROOT REPAIR CHONDROPLASTY;  Surgeon: PLeim Fabry MD;  [place]  Service: Orthopedics;  Laterality: Left;  SUPINE WITH ACL LEG HOLDER ARTHREX ALEX  FLIP CUTTER, MENISCUS ROOT GUIDE STRYKER [place]CETERIX AIR  ANESTHESIA: ADDUCTOR CANAL NERVE BLOCK  . KNEE ARTHROSCOPY WITH MEDIAL MENISECTOMY Right 07/16/2018   Procedure: KNEE ARTHROSCOPY WITH MEDIAL MENISCUS  ROOT REPAIR;  Surgeon: PLeim Fabry MD;  [place] [facility]  Service: Orthopedics;  Laterality: Right;  MENSICUS ROOT GUIDE KIT + SWIVELOCKS,  SWIVELOCK TIBIAL FIXATION CETERIX OPEN KNEE TRAY BOVIE ELECTROCAUTERY  . POLYPECTOMY N/A 11/01/2018   Procedure: POLYPECTOMY;  Surgeon: [name], MD;  Location: [facility];  Service: Endoscopy;  Laterality: N/A;  . TUBAL LIGATION    . VESTIBULAR NERVE SECTION Right 2017   Social History    Socioeconomic History  . Marital status: Married    Spouse name: Arvella Massingale  . Number of children: 4  . Years of education: 21  . Highest education level: High school graduate  Occupational History  . Occupation: CNA    Comment: Caregiver  Tobacco Use  . Smoking status: Never Smoker  . Smokeless tobacco: Never Used  Vaping Use  . Vaping Use: Never used  Substance and Sexual Activity  . Alcohol use: Not Currently    Alcohol/week: 0.0 standard drinks  . Drug use: Never  . Sexual activity: Yes  Other Topics Concern  . Not on file  Social History Narrative  . Not on file   Social Determinants of Health   Financial Resource Strain: Not on file  Food Insecurity: Not on file  Transportation Needs: Not on file  Physical Activity: Not on file  Stress: Not on file  Social Connections: Not on file  Intimate Partner Violence: Not on file   Family History  Problem Relation Age of Onset  . Hypertension Mother   . Diabetes Mother   . Breast cancer Neg Hx    No Known Allergies  Vitals:   06/11/20 0912  BP: 124/78  Pulse: 89  Temp: 98.1 F (36.7 C)  SpO2: 98%   Vitals:   06/11/20 0912  Weight: 200 lb (90.7 kg)  Height: 5' 3"  (1.6 m)   Body mass index is 35.43 kg/m.  No results found.   Independent interpretation of notes and tests performed by another provider:   None  Procedures performed:   Procedure:  Injection of right knee under ultrasound guidance. Ultrasound guidance utilized for lateral joint line approach with bony margins visualized Samsung HS60 device utilized with permanent recording / reporting. Consent obtained and verified. Noted no overlying erythema, induration, or other signs of local infection. Skin prepped in a sterile fashion. Ethyl chloride spray for topical local analgesia.  Completed without difficulty and tolerated well. Medication: Monovisc Advised to contact for fevers/chills, erythema, induration, drainage, or persistent  bleeding.   Pertinent History, Exam, Impression, and Recommendations:   Tricompartment osteoarthritis of right knee Patient tolerated ultrasound guided intraarticular injection of Monovisc today, post-care reviewed. She can repeat this in 6 months if indicated.    Orders & Medications No orders of the defined types were placed in this encounter.  No orders of the defined types were placed in this encounter.    Return if symptoms worsen or fail to improve.     Montel Culver, MD   Primary Care Sports Medicine [place]

## 2020-06-11 NOTE — Patient Instructions (Addendum)
You have just been given a gel lubricant injection to reduce pain. It is important to rest the area of the injection for 48 hours after the injection. If you do have pain, simply rest the joint and use ice. If you can tolerate over the counter medications, you can try Tylenol, Aleve, or Advil for added relief per package instructions.  - Gel can be repeated in 6 months - Call for questions and follow-up wi

## 2020-07-09 ENCOUNTER — Ambulatory Visit: Payer: Commercial Managed Care - PPO | Admitting: Family Medicine

## 2020-07-16 ENCOUNTER — Ambulatory Visit: Payer: Commercial Managed Care - PPO | Admitting: Family Medicine

## 2020-10-21 ENCOUNTER — Telehealth: Payer: Self-pay | Admitting: *Deleted

## 2020-10-21 ENCOUNTER — Encounter: Payer: Self-pay | Admitting: General Surgery

## 2020-10-21 ENCOUNTER — Other Ambulatory Visit: Payer: Self-pay | Admitting: Internal Medicine

## 2020-10-21 MED ORDER — LORAZEPAM 0.5 MG PO TABS: ORAL_TABLET | ORAL | 0 refills | Status: DC

## 2020-10-21 NOTE — Telephone Encounter (Signed)
Patient has MRI scheduled for 11/04.  She needs an order for pre med/ anti- anxiety prior to MRI.  She has taken Lorazepam 0.5mg  in the past with success.   Pharmacy [facility].    Routed to MD.

## 2020-10-28 ENCOUNTER — Other Ambulatory Visit: Payer: Self-pay | Admitting: Radiation Therapy

## 2020-11-08 ENCOUNTER — Other Ambulatory Visit: Payer: Self-pay

## 2020-11-08 ENCOUNTER — Ambulatory Visit (INDEPENDENT_AMBULATORY_CARE_PROVIDER_SITE_OTHER): Payer: Commercial Managed Care - PPO

## 2020-11-08 DIAGNOSIS — Z23 Encounter for immunization: Secondary | ICD-10-CM

## 2020-11-08 DIAGNOSIS — G2581 Restless legs syndrome: Secondary | ICD-10-CM

## 2020-11-08 MED ORDER — GABAPENTIN 100 MG PO CAPS: 100.0000 mg | ORAL_CAPSULE | Freq: Every evening | ORAL | 0 refills | Status: DC

## 2020-11-18 ENCOUNTER — Ambulatory Visit: Payer: Commercial Managed Care - PPO

## 2020-11-19 ENCOUNTER — Ambulatory Visit (HOSPITAL_COMMUNITY): Payer: Commercial Managed Care - PPO

## 2020-11-19 ENCOUNTER — Ambulatory Visit: Admission: RE | Admit: 2020-11-19 | Payer: Commercial Managed Care - PPO | Source: Ambulatory Visit

## 2020-11-20 ENCOUNTER — Other Ambulatory Visit: Payer: Commercial Managed Care - PPO

## 2020-11-21 ENCOUNTER — Ambulatory Visit (HOSPITAL_COMMUNITY)
Admission: RE | Admit: 2020-11-21 | Discharge: 2020-11-21 | Disposition: A | Discharge status: Home / Self Care | Payer: Commercial Managed Care - PPO | Source: Ambulatory Visit | Attending: Internal Medicine | Admitting: Internal Medicine

## 2020-11-21 DIAGNOSIS — D333 Benign neoplasm of cranial nerves: Secondary | ICD-10-CM | POA: Insufficient documentation

## 2020-11-21 MED ORDER — GADOBUTROL 1 MMOL/ML IV SOLN
9.0000 mL | Freq: Once | INTRAVENOUS | Status: AC | PRN
  Administered 2020-11-21: 9 mL via INTRAVENOUS

## 2020-11-22 ENCOUNTER — Inpatient Hospital Stay: Payer: Commercial Managed Care - PPO | Attending: Internal Medicine

## 2020-11-23 ENCOUNTER — Other Ambulatory Visit: Payer: Self-pay | Admitting: Internal Medicine

## 2020-11-23 NOTE — Telephone Encounter (Signed)
Patient only gets refills prior to MRI.  Will order closer to next MRI.

## 2020-11-24 ENCOUNTER — Other Ambulatory Visit: Payer: Self-pay | Admitting: Internal Medicine

## 2020-11-25 ENCOUNTER — Ambulatory Visit: Payer: Commercial Managed Care - PPO | Admitting: Internal Medicine

## 2020-11-26 ENCOUNTER — Ambulatory Visit: Payer: Commercial Managed Care - PPO | Admitting: Internal Medicine

## 2020-11-26 ENCOUNTER — Inpatient Hospital Stay: Payer: Commercial Managed Care - PPO | Attending: Internal Medicine | Admitting: Internal Medicine

## 2020-11-26 ENCOUNTER — Other Ambulatory Visit: Payer: Self-pay

## 2020-11-26 VITALS — BP 133/96 | HR 86 | Temp 97.5°F | Resp 18

## 2020-11-26 DIAGNOSIS — G2581 Restless legs syndrome: Secondary | ICD-10-CM | POA: Diagnosis not present

## 2020-11-26 DIAGNOSIS — Z79899 Other long term (current) drug therapy: Secondary | ICD-10-CM | POA: Insufficient documentation

## 2020-11-26 DIAGNOSIS — Z86018 Personal history of other benign neoplasm: Secondary | ICD-10-CM | POA: Insufficient documentation

## 2020-11-26 DIAGNOSIS — Z8719 Personal history of other diseases of the digestive system: Secondary | ICD-10-CM | POA: Diagnosis not present

## 2020-11-26 DIAGNOSIS — D333 Benign neoplasm of cranial nerves: Secondary | ICD-10-CM

## 2020-11-26 DIAGNOSIS — Z833 Family history of diabetes mellitus: Secondary | ICD-10-CM | POA: Insufficient documentation

## 2020-11-26 DIAGNOSIS — R253 Fasciculation: Secondary | ICD-10-CM | POA: Diagnosis not present

## 2020-11-26 DIAGNOSIS — Z7951 Long term (current) use of inhaled steroids: Secondary | ICD-10-CM | POA: Diagnosis not present

## 2020-11-26 DIAGNOSIS — Z8249 Family history of ischemic heart disease and other diseases of the circulatory system: Secondary | ICD-10-CM | POA: Insufficient documentation

## 2020-11-26 DIAGNOSIS — H9190 Unspecified hearing loss, unspecified ear: Secondary | ICD-10-CM | POA: Diagnosis not present

## 2020-11-26 NOTE — Progress Notes (Signed)
Pt here for MRI results. Reports no new concerns at this time.

## 2020-11-26 NOTE — Progress Notes (Signed)
[facility] [place], [place] [address] [phone]   Interval Evaluation  Date of Service: 11/26/20 Patient Name: Theresa Lester Patient MRN: [id number] Patient DOB: 28-Aug-1970 Provider: Ventura Sellers, MD  Identifying Statement:  Theresa Lester is a 50 y.o. female with  R vestibular   schwannoma   Referring Provider: [name], MD [address] [place],  [place] [address]  Oncologic History: 03/01/2015-03/10/2015: Stereotactic radiosurgery to the 7 mm vestibular schwannoma to 25 Gy in 5 fractions of 5 Gy each.   Interval History:  Theresa Lester presents today following recent brain MRI.  Hearing loss continues over the past year with the right ear.  She has been to audiology and ENT, they have recommended hearing aids. No issues with left sided hearing.  Facial twitching is very rare and sporadic, she has essentially discontinued use of tegretol.  No issue with balance, coordination, gait or memory.    Medications: Current Outpatient Medications on File Prior to Visit  Medication Sig Dispense Refill   Budesonide (PULMICORT FLEXHALER) 90 MCG/ACT inhaler Inhale 1 puff into the lungs 2 (two) times daily. 1 each 5   carbamazepine (TEGRETOL XR) 100 MG 12 hr tablet Take 100 mg by mouth daily.     gabapentin (NEURONTIN) 100 MG capsule Take 1 capsule (100 mg total) by mouth every evening. 90 capsule 0   ibuprofen (ADVIL) 800 MG tablet Take 800 mg by mouth every 8 (eight) hours as needed for moderate pain.     hydrocortisone (ANUSOL-HC) 2.5 % rectal cream Place 1 application rectally 2 (two) times daily. 28 g 0   LORazepam (ATIVAN) 0.5 MG tablet Take one tab po 30 minutes prior to MRI 20 tablet 0   pramoxine-hydrocortisone (PROCTOCREAM-HC) 1-1 % rectal cream Place 1 application rectally 2 (two) times daily. 30 g 0   rifampin (RIFADIN) 300 MG capsule Take 2 capsules (600 mg total) by mouth  daily. (Patient not taking: Reported on 11/26/2020) 60 capsule 3   No current facility-administered medications on file prior to visit.    Allergies: No Known Allergies Past Medical History:  Past Medical History:  Diagnosis Date   Acoustic neuroma ([place])    Arthralgia of hip or thigh 10/19/2014   Blood glucose elevated 10/19/2014   A1C 5.7 [08/2013]    Calculus of gallbladder    Encounter for screening colonoscopy    Fast heart beat 10/19/2014   TFTs normal [02/2014]    GERD (gastroesophageal reflux disease)    Hemorrhoids, internal    Left axillary hidradenitis 11/05/2015   Restless leg    Tinnitus 11/25/2014   Tuberculosis    Past Surgical History:  Past Surgical History:  Procedure Laterality Date   BILATERAL CARPAL TUNNEL RELEASE     COLONOSCOPY WITH PROPOFOL N/A 11/01/2018   Procedure: COLONOSCOPY WITH BIOPSY;  Surgeon: Theresa Lame, MD;  Location: [facility];  Service: Endoscopy;  Laterality: N/A;   KNEE ARTHROSCOPY WITH MEDIAL MENISECTOMY Left 01/18/2017   Procedure: KNEE ARTHROSCOPY WITH MEDIAL MENISECTOMY ROOT REPAIR CHONDROPLASTY;  Surgeon: Theresa Fabry, MD;  Location: [facility];  Service: Orthopedics;  Laterality: Left;  SUPINE WITH ACL LEG HOLDER ARTHREX ALEX  FLIP CUTTER, MENISCUS ROOT GUIDE STRYKER [place] CETERIX AIR  ANESTHESIA: ADDUCTOR CANAL NERVE BLOCK   KNEE ARTHROSCOPY WITH MEDIAL MENISECTOMY Right 07/16/2018   Procedure: KNEE ARTHROSCOPY WITH MEDIAL MENISCUS  ROOT REPAIR;  Surgeon: Theresa Fabry, MD;  Location: [facility];  Service: Orthopedics;  Laterality: Right;  MENSICUS ROOT GUIDE KIT + SWIVELOCKS,  SWIVELOCK TIBIAL FIXATION CETERIX OPEN KNEE TRAY BOVIE ELECTROCAUTERY   POLYPECTOMY N/A 11/01/2018   Procedure: POLYPECTOMY;  Surgeon: Theresa Lame, MD;  Location: [facility];  Service: Endoscopy;  Laterality: N/A;   TUBAL LIGATION     VESTIBULAR NERVE SECTION Right 2017   Social History:  Social History   Socioeconomic  History   Marital status: Married    Spouse name: Theresa Lester   Number of children: 4   Years of education: 12   Highest education level: High school graduate  Occupational History   Occupation: CNA    Comment: Caregiver  Tobacco Use   Smoking status: Never   Smokeless tobacco: Never  Vaping Use   Vaping Use: Never used  Substance and Sexual Activity   Alcohol use: Not Currently    Alcohol/week: 0.0 standard drinks   Drug use: Never   Sexual activity: Yes  Other Topics Concern   Not on file  Social History Narrative   Not on file   Social Determinants of Health   Financial Resource Strain: Not on file  Food Insecurity: Not on file  Transportation Needs: Not on file  Physical Activity: Not on file  Stress: Not on file  Social Connections: Not on file  Intimate Partner Violence: Not on file   Family History:  Family History  Problem Relation Age of Onset   Hypertension Mother    Diabetes Mother    Breast cancer Neg Hx     Review of Systems: Constitutional: Denies fevers, chills or abnormal weight loss Eyes: Denies blurriness of vision Ears, nose, mouth, throat, and face: Denies mucositis or sore throat Respiratory: Denies cough, dyspnea or wheezes Cardiovascular: Denies palpitation, chest discomfort or lower extremity swelling Gastrointestinal:  Denies nausea, constipation, diarrhea GU: Denies dysuria or incontinence Skin: Denies abnormal skin rashes Neurological: Per HPI Musculoskeletal: Denies joint pain, back or neck discomfort. No decrease in ROM Behavioral/Psych: Denies anxiety, disturbance in thought content, and mood instability  Physical Exam: Vitals:   11/26/20 0901  BP: (!) 133/96  Pulse: 86  Resp: 18  Temp: (!) 97.5 F (36.4 C)  SpO2: 99%    KPS: 90. General: Alert, cooperative, pleasant, in no acute distress Head: Normal EENT: Impaired hearing right ear Lungs: Resp effort normal Cardiac: Regular rate and rhythm Abdomen: Soft,  non-distended abdomen Skin: No rashes cyanosis or petechiae. Extremities: No clubbing or edema  Neurologic Exam: Mental Status: Awake, alert, attentive to examiner. Oriented to self and environment. Language is fluent with intact comprehension.  Cranial Nerves: Visual acuity is grossly normal. Visual fields are full. Extra-ocular movements intact. No ptosis. Face is symmetric, tongue midline. Motor: Tone and bulk are normal. Power is full in both arms and legs. Reflexes are symmetric, no pathologic reflexes present. Intact finger to nose bilaterally Sensory: Intact to light touch and temperature Gait: Normal and tandem gait is normal.   Labs: I have reviewed the data as listed    Component Value Date/Time   NA 139 05/14/2020 0918   K 4.5 05/14/2020 0918   CL 102 05/14/2020 0918   CO2 22 05/14/2020 0918   GLUCOSE 118 (H) 05/14/2020 0918   GLUCOSE 102 (H) 01/19/2020 1120   BUN 12 05/14/2020 0918   BUN 13.0 02/17/2015 1505   CREATININE 0.63 05/14/2020 0918   CREATININE 0.7 02/17/2015 1505   CALCIUM 8.6 (L) 05/14/2020 0918   PROT 7.2 01/19/2020 1120  PROT 6.5 09/13/2018 1115   ALBUMIN 3.9 01/19/2020 1120   ALBUMIN 4.2 09/13/2018 1115   AST 19 01/19/2020 1120   ALT 18 01/19/2020 1120   ALKPHOS 68 01/19/2020 1120   BILITOT 0.4 01/19/2020 1120   BILITOT <0.2 09/13/2018 1115   GFRNONAA >60 01/19/2020 1120   GFRAA 124 09/13/2018 1115   Lab Results  Component Value Date   WBC 5.8 01/19/2020   NEUTROABS 3.3 01/19/2020   HGB 12.5 01/19/2020   HCT 39.5 01/19/2020   MCV 84.0 01/19/2020   PLT 295 01/19/2020    Imaging: [facility] Clinician Interpretation: I have personally reviewed the CNS images as listed.  My interpretation, in the context of the patient's clinical presentation, is stable disease  MR BRAIN W WO CONTRAST  Result Date: 11/22/2020 CLINICAL DATA:  Brain/CNS neoplasm, assess treatment response; right vestibular schwannoma EXAM: MRI HEAD WITHOUT AND WITH CONTRAST  TECHNIQUE: Multiplanar, multiecho pulse sequences of the brain and surrounding structures were obtained without and with intravenous contrast. CONTRAST:  67m GADAVIST GADOBUTROL 1 MMOL/ML IV SOLN COMPARISON:  11/21/2019 FINDINGS: Brain: No substantial change in size or appearance of enhancing lesion of the right internal auditory canal again measuring up to 7 mm. No abnormal enhancement within the left internal auditory canal. Inner ear structures demonstrate an unremarkable MR appearance. No acute infarction or intracranial hemorrhage. There is no mass effect or edema. There is no extra-axial fluid collection. Ventricles and sulci are normal in size and configuration. No new abnormal enhancement. Vascular: Major vessel flow voids at the skull base are preserved. Skull and upper cervical spine: Normal marrow signal is preserved. Sinuses/Orbits: Trace mucosal thickening. The orbits are unremarkable. Other: The sella is unremarkable.  Mastoid air cells are clear. IMPRESSION: Stable treated vestibular schwannoma of the right internal auditory canal. No new findings. Electronically Signed   By: PMacy MisM.D.   On: 11/22/2020 08:58     Assessment/Plan Vestibular schwannoma ([facility] [D33.3]   EMarinna Blaneis clinically and radiographically stable today.  She does have ongoing hearing impairment affecting the right ear.    May con't Tegretol as needed for facial twitching.  Will continue to follow with ENT and audiology.  She should return in 2 years following MRI brain for evaluation.  All questions were answered. The patient knows to call the clinic with any problems, questions or concerns. No barriers to learning were detected.  I have spent a total of 30 minutes of face-to-face and non-face-to-face time, excluding clinical staff time, preparing to see patient, ordering tests and/or medications, counseling the patient, and independently interpreting results and communicating results to the  patient/family/caregiver    ZVentura Sellers MD Medical Director of Neuro-Oncology [facility]11/11/22 9:02 AM

## 2020-11-29 ENCOUNTER — Other Ambulatory Visit: Payer: Self-pay | Admitting: Internal Medicine

## 2020-11-29 ENCOUNTER — Other Ambulatory Visit: Payer: Self-pay

## 2020-11-29 MED ORDER — IBUPROFEN 800 MG PO TABS
800.0000 mg | ORAL_TABLET | Freq: Three times a day (TID) | ORAL | 0 refills | Status: DC | PRN
  Filled 2020-11-29: qty 90, 30d supply, fill #0

## 2020-12-03 ENCOUNTER — Other Ambulatory Visit: Payer: Self-pay

## 2020-12-03 MED ORDER — IBUPROFEN 800 MG PO TABS: 800.0000 mg | ORAL_TABLET | Freq: Three times a day (TID) | ORAL | 0 refills | Status: DC | PRN

## 2020-12-03 NOTE — Addendum Note (Signed)
Addended by: Matilde Sprang on: 12/03/2020 10:49 AM   Modules accepted: Orders

## 2020-12-03 NOTE — Telephone Encounter (Signed)
Requested Prescriptions  Pending Prescriptions Disp Refills  . ibuprofen (ADVIL) 800 MG tablet 90 tablet 0    Sig: Take 1 tablet (800 mg total) by mouth every 8 (eight) hours as needed for moderate pain.     Analgesics:  NSAIDS Passed - 12/03/2020 10:49 AM      Passed - Cr in normal range and within 360 days    Creatinine  Date Value Ref Range Status  02/17/2015 0.7 0.6 - 1.1 mg/dL Final   Creatinine, Ser  Date Value Ref Range Status  05/14/2020 0.63 0.57 - 1.00 mg/dL Final         Passed - HGB in normal range and within 360 days    Hemoglobin  Date Value Ref Range Status  01/19/2020 12.5 12.0 - 15.0 g/dL Final  09/13/2018 13.0 11.1 - 15.9 g/dL Final         Passed - Patient is not pregnant      Passed - Valid encounter within last 12 months    Recent Outpatient Visits          5 months ago Tricompartment osteoarthritis of right knee   [facility] Montel Culver, MD   6 months ago Muscle spasm   [facility] [name], MD   7 months ago Lateral epicondylitis, left elbow   [facility] Montel Culver, MD   10 months ago Annual physical exam   [facility] [name], MD   1 year ago External hemorrhoid, thrombosed   [facility] [name], MD      Future Appointments            In 1 month [name], MD [facility]           Signed Prescriptions Disp Refills   ibuprofen (ADVIL) 800 MG tablet 90 tablet 0    Sig: Take 1 tablet (800 mg total) by mouth every 8 (eight) hours as needed for moderate pain.     Analgesics:  NSAIDS Passed - 11/29/2020  1:47 PM      Passed - Cr in normal range and within 360 days    Creatinine  Date Value Ref Range Status  02/17/2015 0.7 0.6 - 1.1 mg/dL Final   Creatinine, Ser  Date Value Ref Range Status  05/14/2020 0.63 0.57 - 1.00 mg/dL Final         Passed - HGB in normal range and within 360 days    Hemoglobin  Date Value Ref  Range Status  01/19/2020 12.5 12.0 - 15.0 g/dL Final  09/13/2018 13.0 11.1 - 15.9 g/dL Final         Passed - Patient is not pregnant      Passed - Valid encounter within last 12 months    Recent Outpatient Visits          5 months ago Tricompartment osteoarthritis of right knee   [facility] Montel Culver, MD   6 months ago Muscle spasm   [place], MD   7 months ago Lateral epicondylitis, left elbow   [facility] Montel Culver, MD   10 months ago Annual physical exam   [facility] [name], MD   1 year ago External hemorrhoid, thrombosed   [facility] [name], MD      Future Appointments  In 1 month Army Melia, Jesse Sans, MD [facility]

## 2020-12-03 NOTE — Telephone Encounter (Signed)
Pt called and stated that the RX went to wrong Pharmacy. Let patient know that pharmacy was requested in mychart. Also let patient know that [facility] can call [facility] and have medication transferred if needed today.   Can we fix this also. Patient would like RX to be sent to   [id number] [place], [place] - [address] Phone:  [phone]  Fax:  [phone]

## 2020-12-30 ENCOUNTER — Other Ambulatory Visit: Payer: Self-pay | Admitting: Internal Medicine

## 2020-12-30 NOTE — Telephone Encounter (Signed)
Requested Prescriptions  Pending Prescriptions Disp Refills   ibuprofen (ADVIL) 800 MG tablet [Pharmacy Med Name: IBUPROFEN 800 MG TABLET] 90 tablet 0    Sig: TAKE 1 TABLET BY MOUTH EVERY 8 HOURS AS NEEDED FOR MODERATE PAIN.     Analgesics:  NSAIDS Passed - 12/30/2020  1:38 AM      Passed - Cr in normal range and within 360 days    Creatinine  Date Value Ref Range Status  02/17/2015 0.7 0.6 - 1.1 mg/dL Final   Creatinine, Ser  Date Value Ref Range Status  05/14/2020 0.63 0.57 - 1.00 mg/dL Final         Passed - HGB in normal range and within 360 days    Hemoglobin  Date Value Ref Range Status  01/19/2020 12.5 12.0 - 15.0 g/dL Final  09/13/2018 13.0 11.1 - 15.9 g/dL Final         Passed - Patient is not pregnant      Passed - Valid encounter within last 12 months    Recent Outpatient Visits          6 months ago Tricompartment osteoarthritis of right knee   [facility] Montel Culver, MD   7 months ago Muscle spasm   [place], MD   8 months ago Lateral epicondylitis, left elbow   [facility] Montel Culver, MD   11 months ago Annual physical exam   [facility] [name], MD   1 year ago External hemorrhoid, thrombosed   [facility] [name], MD      Future Appointments            In 1 week Zigmund Daniel, Earley Abide, MD [facility], [place]   In 2 weeks Army Melia Jesse Sans, MD [facility], [facility]

## 2021-01-04 ENCOUNTER — Ambulatory Visit: Payer: Commercial Managed Care - PPO | Admitting: Internal Medicine

## 2021-01-04 ENCOUNTER — Encounter: Payer: Self-pay | Admitting: Internal Medicine

## 2021-01-04 ENCOUNTER — Other Ambulatory Visit: Payer: Self-pay

## 2021-01-04 VITALS — BP 122/78 | HR 78 | Ht 63.0 in | Wt 199.4 lb

## 2021-01-04 DIAGNOSIS — H1033 Unspecified acute conjunctivitis, bilateral: Secondary | ICD-10-CM | POA: Diagnosis not present

## 2021-01-04 DIAGNOSIS — T783XXA Angioneurotic edema, initial encounter: Secondary | ICD-10-CM | POA: Diagnosis not present

## 2021-01-04 MED ORDER — PREDNISONE 10 MG PO TABS
10.0000 mg | ORAL_TABLET | ORAL | 0 refills | Status: AC
Start: 2021-01-04 — End: 2021-01-10

## 2021-01-04 MED ORDER — NEOMYCIN-POLYMYXIN-HC 3.5-10000-1 OP SUSP: 3.0000 [drp] | Freq: Four times a day (QID) | OPHTHALMIC | 0 refills | Status: DC

## 2021-01-04 NOTE — Progress Notes (Signed)
Date:  01/04/2021   Name:  Theresa Lester   DOB:  [birth year]   MRN:  [id number]   Chief Complaint: Conjunctivitis  Allergic Reaction This is a new problem. The current episode started yesterday. The problem occurs constantly. Associated symptoms include eye itching, eye redness, itching and a rash. Pertinent negatives include no chest pain, chest pressure, coughing, hyperventilation, trouble swallowing or wheezing. Swelling is present on the eyes and face.   Lab Results  Component Value Date   NA 139 05/14/2020   K 4.5 05/14/2020   CO2 22 05/14/2020   GLUCOSE 118 (H) 05/14/2020   BUN 12 05/14/2020   CREATININE 0.63 05/14/2020   CALCIUM 8.6 (L) 05/14/2020   EGFR 107 05/14/2020   GFRNONAA >60 01/19/2020   Lab Results  Component Value Date   CHOL 163 01/19/2020   HDL 63 01/19/2020   LDLCALC 74 01/19/2020   TRIG 128 01/19/2020   CHOLHDL 2.6 01/19/2020   Lab Results  Component Value Date   TSH 3.825 05/07/2020   No results found for: HGBA1C Lab Results  Component Value Date   WBC 5.8 01/19/2020   HGB 12.5 01/19/2020   HCT 39.5 01/19/2020   MCV 84.0 01/19/2020   PLT 295 01/19/2020   Lab Results  Component Value Date   ALT 18 01/19/2020   AST 19 01/19/2020   ALKPHOS 68 01/19/2020   BILITOT 0.4 01/19/2020   No results found for: 25OHVITD2, 25OHVITD3, VD25OH   Review of Systems  Constitutional:  Negative for chills, fatigue and fever.  HENT:  Negative for trouble swallowing.   Eyes:  Positive for pain, redness and itching.  Respiratory:  Negative for cough and wheezing.   Cardiovascular:  Negative for chest pain and palpitations.  Skin:  Positive for itching and rash.  Neurological:  Positive for headaches. Negative for dizziness.   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 02/03/2020   Mild persistent  asthma without complication 46/50/3546   Asthma in adult without complication 56/81/2751   Benign neoplasm of cecum    Immunization, BCG 03/01/2018   Tricompartment osteoarthritis of left knee 10/11/2016   Hemifacial spasm    TIA (transient ischemic attack) 06/11/2015   Episodic tension-type headache, not intractable 04/14/2015   Angioneurotic edema 10/19/2014   Calculus of gallbladder 10/19/2014   Acid reflux 10/19/2014   Hemorrhoids, internal 10/19/2014   Restless leg syndrome 10/19/2014    No Known Allergies  Past Surgical History:  Procedure Laterality Date   BILATERAL CARPAL TUNNEL RELEASE     COLONOSCOPY WITH PROPOFOL N/A 11/01/2018   Procedure: COLONOSCOPY WITH BIOPSY;  Surgeon: [name], MD;  Location: [facility];  Service: Endoscopy;  Laterality: N/A;   KNEE ARTHROSCOPY WITH MEDIAL MENISECTOMY Left 01/18/2017   Procedure: KNEE ARTHROSCOPY WITH MEDIAL MENISECTOMY ROOT REPAIR CHONDROPLASTY;  Surgeon: Leim Fabry, MD;  Location: [facility];  Service: Orthopedics;  Laterality: Left;  SUPINE WITH ACL LEG HOLDER ARTHREX ALEX  FLIP CUTTER, MENISCUS ROOT GUIDE STRYKER [place] CETERIX AIR  ANESTHESIA: ADDUCTOR CANAL NERVE BLOCK   KNEE ARTHROSCOPY WITH MEDIAL MENISECTOMY Right 07/16/2018   Procedure: KNEE ARTHROSCOPY WITH MEDIAL MENISCUS  ROOT REPAIR;  Surgeon: Leim Fabry, MD;  Location: [facility];  Service: Orthopedics;  Laterality: Right;  MENSICUS ROOT GUIDE KIT + SWIVELOCKS,  SWIVELOCK TIBIAL FIXATION CETERIX OPEN KNEE TRAY BOVIE ELECTROCAUTERY  POLYPECTOMY N/A 11/01/2018   Procedure: POLYPECTOMY;  Surgeon: [name], MD;  Location: [facility];  Service: Endoscopy;  Laterality: N/A;   TUBAL LIGATION     VESTIBULAR NERVE SECTION Right 2017    Social History   Tobacco Use   Smoking status: Never   Smokeless tobacco: Never  Vaping Use   Vaping Use: Never used  Substance Use Topics   Alcohol use: Not Currently    Alcohol/week:  0.0 standard drinks   Drug use: Never     Medication list has been reviewed and updated.  Current Meds  Medication Sig   neomycin-polymyxin-hydrocortisone (CORTISPORIN) 3.5-10000-1 ophthalmic suspension Place 3 drops into both eyes 4 (four) times daily.   predniSONE (DELTASONE) 10 MG tablet Take 1 tablet (10 mg total) by mouth as directed for 6 days. Take 6,5,4,3,2,1 then stop    PHQ 2/9 Scores 01/04/2021 06/11/2020 05/14/2020 04/13/2020  PHQ - 2 Score 0 0 0 0  PHQ- 9 Score 0 1 0 0    GAD 7 : Generalized Anxiety Score 01/04/2021 06/11/2020 05/14/2020 04/13/2020  Nervous, Anxious, on Edge 0 0 0 0  Control/stop worrying 0 0 0 0  Worry too much - different things 0 0 0 0  Trouble relaxing 0 0 0 0  Restless 0 0 0 0  Easily annoyed or irritable 0 0 0 0  Afraid - awful might happen 0 0 0 0  Total GAD 7 Score 0 0 0 0  Anxiety Difficulty Not difficult at all Not difficult at all Not difficult at all -    BP Readings from Last 3 Encounters:  01/04/21 122/78  11/26/20 (!) 133/96  06/11/20 124/78    Physical Exam Vitals and nursing note reviewed.  Constitutional:      General: She is not in acute distress.    Appearance: She is well-developed.  HENT:     Head: Normocephalic and atraumatic.     Comments: Face blotchy red without lip or tongue swelling Eyes:     Extraocular Movements: Extraocular movements intact.     Conjunctiva/sclera:     Right eye: Right conjunctiva is injected.     Left eye: Left conjunctiva is injected.     Comments: Eyelids swollen and red - no discharge noted  Cardiovascular:     Rate and Rhythm: Normal rate and regular rhythm.  Pulmonary:     Effort: Pulmonary effort is normal. No respiratory distress.     Breath sounds: No wheezing or rhonchi.  Musculoskeletal:     Cervical back: Normal range of motion.  Skin:    General: Skin is warm and dry.     Findings: No rash.  Neurological:     Mental Status: She is alert and oriented to person, place, and  time.  Psychiatric:        Mood and Affect: Mood normal.        Behavior: Behavior normal.    Wt Readings from Last 3 Encounters:  01/04/21 199 lb 6.4 oz (90.4 kg)  06/11/20 200 lb (90.7 kg)  05/14/20 196 lb (88.9 kg)    BP 122/78    Pulse 78    Ht 5' 3"  (1.6 m)    Wt 199 lb 6.4 oz (90.4 kg)    SpO2 99%    BMI 35.32 kg/m   Assessment and Plan: 1. Acute conjunctivitis of both eyes, unspecified acute conjunctivitis type Use cool compresses. Out of work this week - neomycin-polymyxin-hydrocortisone (CORTISPORIN) 3.5-10000-1 ophthalmic suspension; Place 3  drops into both eyes 4 (four) times daily.  Dispense: 7.5 mL; Refill: 0  2. Allergic angioedema, initial encounter Take prednisone taper. Follow up if worsening - predniSONE (DELTASONE) 10 MG tablet; Take 1 tablet (10 mg total) by mouth as directed for 6 days. Take 6,5,4,3,2,1 then stop  Dispense: 21 tablet; Refill: 0   Partially dictated using Editor, commissioning. Any errors are unintentional.  Halina Maidens, MD [facility]  01/04/2021

## 2021-01-12 ENCOUNTER — Ambulatory Visit: Payer: Commercial Managed Care - PPO | Admitting: Family Medicine

## 2021-01-19 ENCOUNTER — Encounter: Payer: Commercial Managed Care - PPO | Admitting: Internal Medicine

## 2021-01-19 NOTE — Progress Notes (Deleted)
Date:  01/19/2021   Name:  Theresa Lester   DOB:  Nov 02, 1970   MRN:  [id number]   Chief Complaint: No chief complaint on file. Theresa Lester is a 51 y.o. female who presents today for her Complete Annual Exam. She feels {DESC; WELL/FAIRLY WELL/POORLY:18703}. She reports exercising ***. She reports she is sleeping {DESC; WELL/FAIRLY WELL/POORLY:18703}. Breast complaints ***.  Mammogram: 04/2019 [place] DEXA: none Pap smear: 08/2017 thin prep Colonoscopy: 10/2018 repeat 5 yrs  Immunization History  Administered Date(s) Administered   Hepatitis B, adult 02/27/2018, 03/29/2018, 08/30/2018   Influenza, Seasonal, Injecte, Preservative Fre 01/31/2012   Influenza,inj,Quad PF,6+ Mos 12/18/2013, 10/20/2014, 10/11/2016, 09/13/2018, 11/03/2019, 11/08/2020   Moderna Sars-Covid-2 Vaccination 05/06/2019, 06/03/2019, 01/28/2020   PPD Test 02/25/2018   Tdap 09/11/2017   Zoster Recombinat (Shingrix) 07/11/2019, 10/14/2019    HPI  Lab Results  Component Value Date   NA 139 05/14/2020   K 4.5 05/14/2020   CO2 22 05/14/2020   GLUCOSE 118 (H) 05/14/2020   BUN 12 05/14/2020   CREATININE 0.63 05/14/2020   CALCIUM 8.6 (L) 05/14/2020   EGFR 107 05/14/2020   GFRNONAA >60 01/19/2020   Lab Results  Component Value Date   CHOL 163 01/19/2020   HDL 63 01/19/2020   LDLCALC 74 01/19/2020   TRIG 128 01/19/2020   CHOLHDL 2.6 01/19/2020   Lab Results  Component Value Date   TSH 3.825 05/07/2020   No results found for: HGBA1C Lab Results  Component Value Date   WBC 5.8 01/19/2020   HGB 12.5 01/19/2020   HCT 39.5 01/19/2020   MCV 84.0 01/19/2020   PLT 295 01/19/2020   Lab Results  Component Value Date   ALT 18 01/19/2020   AST 19 01/19/2020   ALKPHOS 68 01/19/2020   BILITOT 0.4 01/19/2020   No results found for: Davy Pique, VD25OH   Review of Systems  Patient Active Problem List   Diagnosis Date Noted   Lateral epicondylitis, left elbow 04/13/2020    Tricompartment osteoarthritis of right knee 04/13/2020   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 02/03/2020   Mild persistent asthma without complication 12/01/5206   Asthma in adult without complication 03/10/3610   Benign neoplasm of cecum    Immunization, BCG 03/01/2018   Tricompartment osteoarthritis of left knee 10/11/2016   Hemifacial spasm    TIA (transient ischemic attack) 06/11/2015   Episodic tension-type headache, not intractable 04/14/2015   Angioneurotic edema 10/19/2014   Calculus of gallbladder 10/19/2014   Acid reflux 10/19/2014   Hemorrhoids, internal 10/19/2014   Restless leg syndrome 10/19/2014    No Known Allergies  Past Surgical History:  Procedure Laterality Date   BILATERAL CARPAL TUNNEL RELEASE     COLONOSCOPY WITH PROPOFOL N/A 11/01/2018   Procedure: COLONOSCOPY WITH BIOPSY;  Surgeon: [name], MD;  Location: [facility];  Service: Endoscopy;  Laterality: N/A;   KNEE ARTHROSCOPY WITH MEDIAL MENISECTOMY Left 01/18/2017   Procedure: KNEE ARTHROSCOPY WITH MEDIAL MENISECTOMY ROOT REPAIR CHONDROPLASTY;  Surgeon: Leim Fabry, MD;  Location: [facility];  Service: Orthopedics;  Laterality: Left;  SUPINE WITH ACL LEG HOLDER ARTHREX ALEX  FLIP CUTTER, MENISCUS ROOT GUIDE STRYKER [place] CETERIX AIR  ANESTHESIA: ADDUCTOR CANAL NERVE BLOCK   KNEE ARTHROSCOPY WITH MEDIAL MENISECTOMY Right 07/16/2018   Procedure: KNEE ARTHROSCOPY WITH MEDIAL MENISCUS  ROOT REPAIR;  Surgeon: Leim Fabry, MD;  Location: [facility];  Service: Orthopedics;  Laterality: Right;  MENSICUS ROOT GUIDE KIT + SWIVELOCKS,  SWIVELOCK TIBIAL FIXATION  OPEN KNEE TRAY °BOVIE ELECTROCAUTERY  ° POLYPECTOMY N/A 11/01/2018  ° Procedure: POLYPECTOMY;  Surgeon: Wohl, Darren, MD;  Location: [facility];  Service: Endoscopy;  Laterality: N/A;  ° TUBAL LIGATION    ° VESTIBULAR NERVE SECTION Right 2017  ° ° °Social History  ° °Tobacco Use  ° Smoking status: Never  ° Smokeless  tobacco: Never  °Vaping Use  ° Vaping Use: Never used  °Substance Use Topics  ° Alcohol use: Not Currently  °  Alcohol/week: 0.0 standard drinks  ° Drug use: Never  ° ° ° °Medication list has been reviewed and updated. ° °No outpatient medications have been marked as taking for the 01/19/21 encounter (Appointment) with Berglund, Laura H, MD.  ° ° °PHQ 2/9 Scores 01/04/2021 06/11/2020 05/14/2020 04/13/2020  °PHQ - 2 Score 0 0 0 0  °PHQ- 9 Score 0 1 0 0  ° ° °GAD 7 : Generalized Anxiety Score 01/04/2021 06/11/2020 05/14/2020 04/13/2020  °Nervous, Anxious, on Edge 0 0 0 0  °Control/stop worrying 0 0 0 0  °Worry too much - different things 0 0 0 0  °Trouble relaxing 0 0 0 0  °Restless 0 0 0 0  °Easily annoyed or irritable 0 0 0 0  °Afraid - awful might happen 0 0 0 0  °Total GAD 7 Score 0 0 0 0  °Anxiety Difficulty Not difficult at all Not difficult at all Not difficult at all -  ° ° °BP Readings from Last 3 Encounters:  °01/04/21 122/78  °11/26/20 (!) 133/96  °06/11/20 124/78  ° ° °Physical Exam ° °Wt Readings from Last 3 Encounters:  °01/04/21 199 lb 6.4 oz (90.4 kg)  °06/11/20 200 lb (90.7 kg)  °05/14/20 196 lb (88.9 kg)  ° ° °There were no vitals taken for this visit. ° °Assessment and Plan: ° ° ° ° °

## 2021-01-25 ENCOUNTER — Ambulatory Visit: Payer: Commercial Managed Care - PPO | Admitting: Family Medicine

## 2021-01-25 ENCOUNTER — Encounter: Payer: Self-pay | Admitting: Family Medicine

## 2021-01-25 ENCOUNTER — Ambulatory Visit
Admission: RE | Admit: 2021-01-25 | Discharge: 2021-01-25 | Disposition: A | Discharge status: Home / Self Care | Payer: Commercial Managed Care - PPO | Attending: Family Medicine | Admitting: Family Medicine

## 2021-01-25 ENCOUNTER — Other Ambulatory Visit: Payer: Self-pay

## 2021-01-25 ENCOUNTER — Ambulatory Visit
Admission: RE | Admit: 2021-01-25 | Discharge: 2021-01-25 | Disposition: A | Discharge status: Home / Self Care | Payer: Commercial Managed Care - PPO | Source: Ambulatory Visit | Attending: Family Medicine | Admitting: Family Medicine

## 2021-01-25 VITALS — BP 114/86 | HR 86 | Ht 63.0 in | Wt 198.0 lb

## 2021-01-25 DIAGNOSIS — M7541 Impingement syndrome of right shoulder: Secondary | ICD-10-CM | POA: Diagnosis not present

## 2021-01-25 DIAGNOSIS — M62838 Other muscle spasm: Secondary | ICD-10-CM | POA: Diagnosis not present

## 2021-01-25 MED ORDER — MELOXICAM 15 MG PO TABS: 15.0000 mg | ORAL_TABLET | Freq: Every day | ORAL | 0 refills | Status: DC

## 2021-01-25 MED ORDER — CYCLOBENZAPRINE HCL 10 MG PO TABS: 10.0000 mg | ORAL_TABLET | Freq: Every day | ORAL | 0 refills | Status: DC

## 2021-01-25 NOTE — Progress Notes (Signed)
Primary Care / Sports Medicine Office Visit  Patient Information:  Patient ID: Theresa Lester, female DOB: 1970/11/10 Age: 51 y.o. MRN: [id number]   Theresa Lester is a pleasant 51 y.o. 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 02/03/2020   Mild persistent asthma without complication 07/12/9483   Benign neoplasm of cecum    Immunization, BCG 03/01/2018   Tricompartment osteoarthritis of left knee 10/11/2016   Hemifacial spasm    TIA (transient ischemic attack) 06/11/2015   Episodic tension-type headache, not intractable 04/14/2015   Angioneurotic edema 10/19/2014   Calculus of gallbladder 10/19/2014   Acid reflux 10/19/2014   Hemorrhoids, internal 10/19/2014   Restless leg syndrome 10/19/2014    Vitals:   01/25/21 0908  BP: 114/86  Pulse: 86  SpO2: 98%   Vitals:   01/25/21 0908  Weight: 198 lb (89.8 kg)  Height: 5\' 3"  (1.6 m)   Body mass index is 35.07 kg/m.  No results found.   Independent interpretation of notes and tests performed by another provider:   Independent interpretation of right shoulder x-ray dated 02/06/2016 from [facility] reveals mild-moderate acromioclavicular osteoarthritis, no glenohumeral osteoarthritis, type I acromion, no alignment issues, no acute osseous processes  Procedures performed:   None  Pertinent History, Exam, Impression, and  Recommendations:   Rotator cuff impingement syndrome, right Right-hand-dominant patient presenting with roughly 2 month history of atraumatic right shoulder pain in the setting of relative overuse (was lifting heavy boxes at that time).  She has since noted progression of pain to involve right sided neck and right elbow, has had intermittent paresthesias in the right upper extremity.  She has had nighttime pain, pain with overhead and reaching behind activities, ADL interference.  Treatments have included relative rest and ibuprofen 800 mg dosed sparingly.  She does have x-rays from 2018 for the right shoulder with indication "right shoulder pain ", she is unable to recall that episode.  Physical examination today reveals limited range of motion with forward flexion when compared to contralateral, this is due to pain, passively can attain full range of motion with pain, abduction limited by 10 degrees when compared to contralateral, again limited by pain, full external rotation, internal rotation/extension to the right hip, contralateral to the L3-4 region, resisted rotator cuff testing reveals benign findings during external rotation and internal rotation, isolated supraspinatus testing elicits maximal pain that reproduces her symptoms, 4/5 strength, positive Neer's, positive Hawkins, otherwise negative provocative testing for the shoulder.  Paraspinal right-sided cervical musculature is tender and demonstrates spasm, she has negative Spurling's test bilaterally.  Clinical history and exam findings are most consistent with rotator cuff related shoulder impingement with secondary/compensatory involvement of the cervical spine and associated intermittent right upper extremity paresthesias.  I reviewed the same with the patient as well as treatment strategies, we will obtain updated x-rays of her right shoulder and new x-rays of her cervical spine, initiate meloxicam in place of ibuprofen scheduled for 2 weeks,  nightly cyclobenzaprine, continued activity modification, and close follow-up in 2 weeks.  If suboptimal progress noted despite adherence to the above, pending x-rays, can discuss escalation of pharmacotherapy, corticosteroid injections, formal physical therapy.  Cervical paraspinal muscle spasm Patient has noted progressive right sided neck pain in the setting of comorbid primary right shoulder pain for the past few months, intermittent paresthesias in the right upper extremity. See additional assessment(s) for plan details.   Orders & Medications Meds ordered this encounter  Medications   meloxicam (MOBIC) 15 MG tablet    Sig: Take 1 tablet (15 mg total) by mouth daily.    Dispense:  30 tablet    Refill:  0   cyclobenzaprine (FLEXERIL) 10 MG tablet    Sig: Take 1 tablet (10 mg total) by mouth at bedtime. One half to one tab PO qHS, then increase gradually to one tab TID.    Dispense:  30 tablet    Refill:  0   Orders Placed This Encounter  Procedures   DG Shoulder Right   DG Cervical Spine Complete     Return in about 2 weeks (around 02/08/2021).     Montel Culver, MD   Primary Care Sports Medicine [place]

## 2021-01-25 NOTE — Assessment & Plan Note (Signed)
Patient has noted progressive right sided neck pain in the setting of comorbid primary right shoulder pain for the past few months, intermittent paresthesias in the right upper extremity. See additional assessment(s) for plan details.

## 2021-01-25 NOTE — Patient Instructions (Signed)
-   Obtain x-rays downstairs - Start meloxicam once daily with food - Start cyclobenzaprine nightly (side effect can be drowsiness) - Perform gentle activity as tolerated (stick to daily tasks) - Return for follow-up in 2 weeks, contact us for any questions between now and then

## 2021-01-25 NOTE — Assessment & Plan Note (Signed)
Right-hand-dominant patient presenting with roughly 2 month history of atraumatic right shoulder pain in the setting of relative overuse (was lifting heavy boxes at that time).  She has since noted progression of pain to involve right sided neck and right elbow, has had intermittent paresthesias in the right upper extremity.  She has had nighttime pain, pain with overhead and reaching behind activities, ADL interference.  Treatments have included relative rest and ibuprofen 800 mg dosed sparingly.  She does have x-rays from 2018 for the right shoulder with indication "right shoulder pain ", she is unable to recall that episode.  Physical examination today reveals limited range of motion with forward flexion when compared to contralateral, this is due to pain, passively can attain full range of motion with pain, abduction limited by 10 degrees when compared to contralateral, again limited by pain, full external rotation, internal rotation/extension to the right hip, contralateral to the L3-4 region, resisted rotator cuff testing reveals benign findings during external rotation and internal rotation, isolated supraspinatus testing elicits maximal pain that reproduces her symptoms, 4/5 strength, positive Neer's, positive Hawkins, otherwise negative provocative testing for the shoulder.  Paraspinal right-sided cervical musculature is tender and demonstrates spasm, she has negative Spurling's test bilaterally.  Clinical history and exam findings are most consistent with rotator cuff related shoulder impingement with secondary/compensatory involvement of the cervical spine and associated intermittent right upper extremity paresthesias.  I reviewed the same with the patient as well as treatment strategies, we will obtain updated x-rays of her right shoulder and new x-rays of her cervical spine, initiate meloxicam in place of ibuprofen scheduled for 2 weeks, nightly cyclobenzaprine, continued activity modification, and  close follow-up in 2 weeks.  If suboptimal progress noted despite adherence to the above, pending x-rays, can discuss escalation of pharmacotherapy, corticosteroid injections, formal physical therapy.

## 2021-02-10 ENCOUNTER — Encounter: Payer: Self-pay | Admitting: Family Medicine

## 2021-02-10 ENCOUNTER — Ambulatory Visit: Payer: Commercial Managed Care - PPO | Admitting: Family Medicine

## 2021-02-10 ENCOUNTER — Ambulatory Visit: Payer: Commercial Managed Care - PPO | Admitting: Internal Medicine

## 2021-02-10 ENCOUNTER — Other Ambulatory Visit: Payer: Self-pay

## 2021-02-10 DIAGNOSIS — M7541 Impingement syndrome of right shoulder: Secondary | ICD-10-CM

## 2021-02-10 DIAGNOSIS — M62838 Other muscle spasm: Secondary | ICD-10-CM

## 2021-02-10 MED ORDER — MELOXICAM 15 MG PO TABS: 15.0000 mg | ORAL_TABLET | Freq: Every day | ORAL | 1 refills | Status: DC | PRN

## 2021-02-10 MED ORDER — CYCLOBENZAPRINE HCL 10 MG PO TABS: 10.0000 mg | ORAL_TABLET | Freq: Two times a day (BID) | ORAL | 0 refills | Status: DC | PRN

## 2021-02-10 NOTE — Patient Instructions (Signed)
-   Continue meloxicam daily x2 weeks (take with food) - After 2 weeks, take daily on an as-needed basis for shoulder pain - Can transition to as needed dosing of cyclobenzaprine (muscle relaxer) up to twice a day for muscle tightness pain, side effect can be drowsiness - Start and gradually progress with the home exercises provided to you today, and you will need to pick up resistance bands - Return for follow-up in 6 weeks, contact us for questions between now and then

## 2021-02-10 NOTE — Assessment & Plan Note (Signed)
Last visit on 01/25/2021, patient relayed primarily right shoulder pain with right upper extremity paresthesias, seen on examination to be consistent with cervical paraspinal spasm and concern for underlying neural impingement.  She was started on meloxicam and cyclobenzaprine, new x-rays were obtained.  Since that time she has noted resolution of the right upper extremity paresthesias, her examination does show improved, though persistent, paraspinal right cervical spasm.  Have advised patient to continue meloxicam for 2 weeks then transition to as needed dosing, proceed with a transition to as needed dosing of cyclobenzaprine, and start a home-based physical therapy program with plans for follow-up in 6 weeks time. See additional assessment(s) for plan details.

## 2021-02-10 NOTE — Assessment & Plan Note (Signed)
Patient presents for follow-up to right shoulder pain noted at the last visit on 01/25/2021 to be in the setting of primarily supraspinatus tendinopathy and resulting impingement.  At that visit I advised scheduled meloxicam and cyclobenzaprine, relative rest, x-rays of the shoulder and cervical spine, and close follow-up.  Since that time patient has noted improvement in her symptoms, though not resolution.  Has noted resolution of the right upper arm paresthesias, tolerating the medications well.  Her examination now shows full rotator cuff strength that is essentially painless, mild paraspinal right cervical muscular spasm, negative Spurling's, equivocal Neer's, negative Hawkins.  Patient has demonstrated improvement, her x-rays do reveal degenerative changes about the cervical spine and shoulder which can account for the severity previously noted.  I have advised medication management and to change from scheduled meloxicam to as needed dosing in 2 weeks time, a full transition to as needed dosing of cyclobenzaprine starting today.  I also discussed formal versus home-based physical therapy, she is amenable to home-based rehab for 6 weeks, after which she will return for follow-up evaluation.  If suboptimal progress noted despite adherence to this care plan, further medication modifications, and advanced imaging with MRI, and corticosteroid injections can all be considered.  Independent interpretation of x-rays, medication management

## 2021-02-10 NOTE — Progress Notes (Signed)
Primary Care / Sports Medicine Office Visit  Patient Information:  Patient ID: Theresa Lester, female DOB: 02/10/1970 Age: 51 y.o. MRN: [id number]   Theresa Lester is a pleasant 51 y.o. female presenting with the following:  Chief Complaint  Patient presents with   Rotator cuff impingement syndrome, right    X-Rays 01/25/21; taking meloxicam daily and cyclobenzaprine as needed with some relief; denies paresthesias; 7/10 pain    Vitals:   02/10/21 0852  BP: 126/82  Pulse: 83  SpO2: 99%   Vitals:   02/10/21 0852  Weight: 203 lb (92.1 kg)  Height: 5\' 3"  (1.6 m)   Body mass index is 35.96 kg/m.  DG Cervical Spine Complete  Result Date: 01/25/2021 CLINICAL DATA:  Right upper arm paresthesia EXAM: CERVICAL SPINE - COMPLETE 4+ VIEW COMPARISON:  02/06/2016. FINDINGS: There is straightening of the normal cervical lordosis. Vertebral body heights are maintained. The atlantodens interval is intact. Moderate anterior C6-7 and mild anterior C4-5 and C5-6 endplate osteophytes. Only mild anterior C6-7 disc space narrowing. No significant neural foraminal stenosis is seen on oblique views. IMPRESSION: No significant change from prior. Mild-to-moderate degenerative endplate changes greatest at C6-7. Only minimal anterior C6-7 disc space narrowing. Electronically Signed   By: Yvonne Kendall   On: 01/25/2021 18:03   DG Shoulder Right  Result Date: 01/25/2021 CLINICAL DATA:  Chronic right shoulder pain. EXAM: RIGHT SHOULDER - 2+ VIEW COMPARISON:  None. FINDINGS: Moderate acromioclavicular joint space narrowing and peripheral osteophytosis. Minimal peripheral glenoid degenerative osteophytosis. No acute fracture or dislocation. Mild distal lateral broad-based subacromial spurring. The visualized portion of the right lung is unremarkable. There is a 5 mm ossicle adjacent to the proximal humerus on axillary view, possibly a loose body within the biceps tendon sheath but not well  localized on other views. IMPRESSION: Moderate acromioclavicular osteoarthritis. Mild distal lateral subacromial broad-based spurring. Electronically Signed   By: Yvonne Kendall   On: 01/25/2021 18:00     Independent interpretation of notes and tests performed by another provider:   Independent interpretation of cervical spine x-ray dated 01/25/2021 reveals straightening of the expected cervical lordosis, focal intervertebral narrowing and anterior endplate osteophyte formation at the C4-5, C5-6, and C6-7 levels, no significant foraminal narrowing noted  Independent interpretation of right shoulder x-rays dated 01/25/2021 reveal mild generative changes at the glenohumeral articulation, secondarily at the acromioclavicular joint where there is moderate osteoarthritis, cortical roughening at the subacromial space consistent with degenerative changes, and well-corticated ossicle in the vicinity of the humerus proximally, chronic in appearance.  Procedures performed:   None  Pertinent History, Exam, Impression, and Recommendations:   Rotator cuff impingement syndrome, right Patient presents for follow-up to right shoulder pain noted at the last visit on 01/25/2021 to be in the setting of primarily supraspinatus tendinopathy and resulting impingement.  At that visit I advised scheduled meloxicam and cyclobenzaprine, relative rest, x-rays of the shoulder and cervical spine, and close follow-up.  Since that time patient has noted improvement in her symptoms, though not resolution.  Has noted resolution of the right upper arm paresthesias, tolerating the medications well.  Her examination now shows full rotator cuff strength that is essentially painless, mild paraspinal right cervical muscular spasm, negative Spurling's, equivocal Neer's, negative Hawkins.  Patient has demonstrated improvement, her x-rays do reveal degenerative changes about the cervical spine and shoulder which can account for the severity  previously noted.  I have advised medication management and to change from scheduled meloxicam to  as needed dosing in 2 weeks time, a full transition to as needed dosing of cyclobenzaprine starting today.  I also discussed formal versus home-based physical therapy, she is amenable to home-based rehab for 6 weeks, after which she will return for follow-up evaluation.  If suboptimal progress noted despite adherence to this care plan, further medication modifications, and advanced imaging with MRI, and corticosteroid injections can all be considered.  Independent interpretation of x-rays, medication management  Cervical paraspinal muscle spasm Last visit on 01/25/2021, patient relayed primarily right shoulder pain with right upper extremity paresthesias, seen on examination to be consistent with cervical paraspinal spasm and concern for underlying neural impingement.  She was started on meloxicam and cyclobenzaprine, new x-rays were obtained.  Since that time she has noted resolution of the right upper extremity paresthesias, her examination does show improved, though persistent, paraspinal right cervical spasm.  Have advised patient to continue meloxicam for 2 weeks then transition to as needed dosing, proceed with a transition to as needed dosing of cyclobenzaprine, and start a home-based physical therapy program with plans for follow-up in 6 weeks time. See additional assessment(s) for plan details.   Orders & Medications Meds ordered this encounter  Medications   cyclobenzaprine (FLEXERIL) 10 MG tablet    Sig: Take 1 tablet (10 mg total) by mouth 2 (two) times daily as needed for muscle spasms.    Dispense:  30 tablet    Refill:  0   meloxicam (MOBIC) 15 MG tablet    Sig: Take 1 tablet (15 mg total) by mouth daily as needed for pain.    Dispense:  30 tablet    Refill:  1   No orders of the defined types were placed in this encounter.    Return in about 6 weeks (around 03/24/2021).      Montel Culver, MD   Primary Care Sports Medicine [place]

## 2021-02-16 ENCOUNTER — Encounter: Payer: Commercial Managed Care - PPO | Admitting: Internal Medicine

## 2021-02-24 ENCOUNTER — Other Ambulatory Visit: Payer: Self-pay

## 2021-02-24 ENCOUNTER — Ambulatory Visit (INDEPENDENT_AMBULATORY_CARE_PROVIDER_SITE_OTHER): Payer: Commercial Managed Care - PPO | Admitting: Internal Medicine

## 2021-02-24 ENCOUNTER — Encounter: Payer: Self-pay | Admitting: Internal Medicine

## 2021-02-24 ENCOUNTER — Other Ambulatory Visit (HOSPITAL_COMMUNITY)
Admission: RE | Admit: 2021-02-24 | Discharge: 2021-02-24 | Disposition: A | Discharge status: Home / Self Care | Payer: Commercial Managed Care - PPO | Source: Ambulatory Visit | Attending: Internal Medicine | Admitting: Internal Medicine

## 2021-02-24 VITALS — BP 128/70 | HR 95 | Ht 63.0 in | Wt 197.8 lb

## 2021-02-24 DIAGNOSIS — R21 Rash and other nonspecific skin eruption: Secondary | ICD-10-CM

## 2021-02-24 DIAGNOSIS — J4530 Mild persistent asthma, uncomplicated: Secondary | ICD-10-CM

## 2021-02-24 DIAGNOSIS — Z124 Encounter for screening for malignant neoplasm of cervix: Secondary | ICD-10-CM | POA: Insufficient documentation

## 2021-02-24 DIAGNOSIS — Z1231 Encounter for screening mammogram for malignant neoplasm of breast: Secondary | ICD-10-CM | POA: Diagnosis not present

## 2021-02-24 DIAGNOSIS — Z0000 Encounter for general adult medical examination without abnormal findings: Secondary | ICD-10-CM

## 2021-02-24 MED ORDER — PULMICORT FLEXHALER 90 MCG/ACT IN AEPB: 1.0000 | INHALATION_SPRAY | Freq: Two times a day (BID) | RESPIRATORY_TRACT | 5 refills | Status: AC

## 2021-02-24 MED ORDER — TRIAMCINOLONE ACETONIDE 0.1 % EX CREA: 1.0000 "application " | TOPICAL_CREAM | Freq: Two times a day (BID) | CUTANEOUS | 5 refills | Status: DC

## 2021-02-24 MED ORDER — ALBUTEROL SULFATE HFA 108 (90 BASE) MCG/ACT IN AERS: 2.0000 | INHALATION_SPRAY | Freq: Four times a day (QID) | RESPIRATORY_TRACT | 0 refills | Status: AC | PRN

## 2021-02-24 NOTE — Progress Notes (Signed)
Date:  02/24/2021   Name:  Theresa Lester   DOB:  1970/10/12   MRN:  [id number]   Chief Complaint: Annual Exam (Breast Exam. No pap.) Theresa Lester is a 51 y.o. female who presents today for her Complete Annual Exam. She feels fairly well. She reports exercising - none. She reports she is sleeping poorly. Breast complaints - none.  Mammogram: 04/2019 DEXA: none Pap smear: 08/2017 neg thin prep Colonoscopy: 10/2018  Immunization History  Administered Date(s) Administered   Hepatitis B, adult 02/27/2018, 03/29/2018, 08/30/2018   Influenza, Seasonal, Injecte, Preservative Fre 01/31/2012   Influenza,inj,Quad PF,6+ Mos 12/18/2013, 10/20/2014, 10/11/2016, 09/13/2018, 11/03/2019, 11/08/2020   Moderna Sars-Covid-2 Vaccination 05/06/2019, 06/03/2019, 01/28/2020   PPD Test 02/25/2018   Tdap 09/11/2017   Zoster Recombinat (Shingrix) 07/11/2019, 10/14/2019    Asthma She complains of shortness of breath and wheezing. There is no cough. This is a recurrent problem. Pertinent negatives include no chest pain, fever, headaches or trouble swallowing. Her symptoms are alleviated by steroid inhaler. She reports moderate improvement on treatment. Her past medical history is significant for asthma.  Rash This is a chronic problem. The problem has been gradually worsening since onset. The affected locations include the right foot and left foot. The rash is characterized by itchiness, scaling and redness. She was exposed to nothing. Associated symptoms include shortness of breath. Pertinent negatives include no congestion, cough, diarrhea, fatigue, fever or vomiting. Her past medical history is significant for asthma.   Lab Results  Component Value Date   NA 139 05/14/2020   K 4.5 05/14/2020   CO2 22 05/14/2020   GLUCOSE 118 (H) 05/14/2020   BUN 12 05/14/2020   CREATININE 0.63 05/14/2020   CALCIUM 8.6 (L) 05/14/2020   EGFR 107 05/14/2020   GFRNONAA >60 01/19/2020   Lab Results   Component Value Date   CHOL 163 01/19/2020   HDL 63 01/19/2020   LDLCALC 74 01/19/2020   TRIG 128 01/19/2020   CHOLHDL 2.6 01/19/2020   Lab Results  Component Value Date   TSH 3.825 05/07/2020   No results found for: HGBA1C Lab Results  Component Value Date   WBC 5.8 01/19/2020   HGB 12.5 01/19/2020   HCT 39.5 01/19/2020   MCV 84.0 01/19/2020   PLT 295 01/19/2020   Lab Results  Component Value Date   ALT 18 01/19/2020   AST 19 01/19/2020   ALKPHOS 68 01/19/2020   BILITOT 0.4 01/19/2020   No results found for: 25OHVITD2, 25OHVITD3, VD25OH   Review of Systems  Constitutional:  Negative for chills, fatigue and fever.  HENT:  Negative for congestion, hearing loss, tinnitus, trouble swallowing and voice change.   Eyes:  Negative for visual disturbance.  Respiratory:  Positive for shortness of breath and wheezing. Negative for cough and chest tightness.   Cardiovascular:  Negative for chest pain, palpitations and leg swelling.  Gastrointestinal:  Negative for abdominal pain, constipation, diarrhea and vomiting.  Endocrine: Negative for polydipsia and polyuria.  Genitourinary:  Negative for dysuria, frequency, genital sores, vaginal bleeding and vaginal discharge.  Musculoskeletal:  Positive for arthralgias (shoulder pain). Negative for gait problem and joint swelling.  Skin:  Positive for rash. Negative for color change.  Neurological:  Negative for dizziness, tremors, light-headedness and headaches.  Hematological:  Negative for adenopathy. Does not bruise/bleed easily.  Psychiatric/Behavioral:  Negative for dysphoric mood and sleep disturbance. The patient is not nervous/anxious.    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  02/03/2020   Mild persistent asthma without complication 08/81/1031   Benign neoplasm of cecum    Immunization, BCG 03/01/2018   Tricompartment osteoarthritis of left knee 10/11/2016   Hemifacial spasm    TIA (transient ischemic attack) 06/11/2015   Episodic tension-type headache, not intractable 04/14/2015   Angioneurotic edema 10/19/2014   Calculus of gallbladder 10/19/2014   Acid reflux 10/19/2014   Hemorrhoids, internal 10/19/2014   Restless leg syndrome 10/19/2014    No Known Allergies  Past Surgical History:  Procedure Laterality Date   BILATERAL CARPAL TUNNEL RELEASE     COLONOSCOPY WITH PROPOFOL N/A 11/01/2018   Procedure: COLONOSCOPY WITH BIOPSY;  Surgeon: [name], MD;  Location: [facility];  Service: Endoscopy;  Laterality: N/A;   KNEE ARTHROSCOPY WITH MEDIAL MENISECTOMY Left 01/18/2017   Procedure: KNEE ARTHROSCOPY WITH MEDIAL MENISECTOMY ROOT REPAIR CHONDROPLASTY;  Surgeon: Leim Fabry, MD;  Location: [facility];  Service: Orthopedics;  Laterality: Left;  SUPINE WITH ACL LEG HOLDER ARTHREX ALEX  FLIP CUTTER, MENISCUS ROOT GUIDE STRYKER [place] CETERIX AIR  ANESTHESIA: ADDUCTOR CANAL NERVE BLOCK   KNEE ARTHROSCOPY WITH MEDIAL MENISECTOMY Right 07/16/2018   Procedure: KNEE ARTHROSCOPY WITH MEDIAL MENISCUS  ROOT REPAIR;  Surgeon: Leim Fabry, MD;  Location: [facility];  Service: Orthopedics;  Laterality: Right;  MENSICUS ROOT GUIDE KIT + SWIVELOCKS,  SWIVELOCK TIBIAL FIXATION CETERIX OPEN KNEE TRAY BOVIE ELECTROCAUTERY   POLYPECTOMY N/A 11/01/2018   Procedure: POLYPECTOMY;  Surgeon: [name], MD;  Location: [facility];  Service: Endoscopy;  Laterality: N/A;   TUBAL LIGATION     VESTIBULAR NERVE SECTION Right 2017    Social History   Tobacco Use   Smoking status: Never   Smokeless tobacco: Never  Vaping Use   Vaping Use: Never used  Substance Use Topics   Alcohol use: Not Currently    Alcohol/week: 0.0 standard drinks    Drug use: Never     Medication list has been reviewed and updated.  Current Meds  Medication Sig   Budesonide (PULMICORT FLEXHALER) 90 MCG/ACT inhaler Inhale 1 puff into the lungs 2 (two) times daily.   carbamazepine (TEGRETOL XR) 100 MG 12 hr tablet Take 100 mg by mouth daily as needed.   cyclobenzaprine (FLEXERIL) 10 MG tablet Take 1 tablet (10 mg total) by mouth 2 (two) times daily as needed for muscle spasms.   gabapentin (NEURONTIN) 100 MG capsule Take 1 capsule (100 mg total) by mouth every evening.   hydrocortisone (ANUSOL-HC) 2.5 % rectal cream Place 1 application rectally 2 (two) times daily.   meloxicam (MOBIC) 15 MG tablet Take 1 tablet (15 mg total) by mouth daily as needed for pain.    PHQ 2/9 Scores 02/24/2021 02/10/2021 01/25/2021 01/04/2021  PHQ - 2 Score 0 0 0 0  PHQ- 9 Score 0 0 0 0    GAD 7 : Generalized Anxiety Score 02/24/2021 02/10/2021 01/25/2021 01/04/2021  Nervous, Anxious, on Edge 0 0 0 0  Control/stop worrying 0 0 0 0  Worry too much - different things 0 0 0 0  Trouble relaxing 0 0 0 0  Restless 0 0 0 0  Easily annoyed or irritable 0 0 0 0  Afraid - awful might happen 0 0 0 0  Total GAD 7 Score 0 0  0 0  Anxiety Difficulty Not difficult at all Not difficult at all Not difficult at all Not difficult at all    BP Readings from Last 3 Encounters:  02/24/21 128/70  02/10/21 126/82  01/25/21 114/86    Physical Exam Vitals and nursing note reviewed.  Constitutional:      General: She is not in acute distress.    Appearance: She is well-developed.  HENT:     Head: Normocephalic and atraumatic.     Right Ear: Tympanic membrane and ear canal normal.     Left Ear: Tympanic membrane and ear canal normal.     Nose:     Right Sinus: No maxillary sinus tenderness.     Left Sinus: No maxillary sinus tenderness.  Eyes:     General: No scleral icterus.       Right eye: No discharge.        Left eye: No discharge.     Conjunctiva/sclera: Conjunctivae normal.   Neck:     Thyroid: No thyromegaly.     Vascular: No carotid bruit.  Cardiovascular:     Rate and Rhythm: Normal rate and regular rhythm.     Pulses: Normal pulses.     Heart sounds: Normal heart sounds.  Pulmonary:     Effort: Pulmonary effort is normal. No respiratory distress.     Breath sounds: No wheezing.  Chest:  Breasts:    Right: No mass, nipple discharge, skin change or tenderness.     Left: No mass, nipple discharge, skin change or tenderness.  Abdominal:     General: Bowel sounds are normal.     Palpations: Abdomen is soft.     Tenderness: There is no abdominal tenderness.  Genitourinary:    Labia:        Right: No tenderness, lesion or injury.        Left: No tenderness, lesion or injury.      Vagina: Normal.     Cervix: Friability present.     Uterus: Normal.      Adnexa: Right adnexa normal and left adnexa normal.  Musculoskeletal:     Right shoulder: Decreased range of motion.     Left shoulder: Decreased range of motion.     Cervical back: Normal range of motion. No erythema.     Right lower leg: No edema.     Left lower leg: No edema.  Lymphadenopathy:     Cervical: No cervical adenopathy.  Skin:    General: Skin is warm and dry.     Findings: Rash present.  Neurological:     Mental Status: She is alert and oriented to person, place, and time.     Cranial Nerves: No cranial nerve deficit.     Sensory: No sensory deficit.     Deep Tendon Reflexes: Reflexes are normal and symmetric.  Psychiatric:        Attention and Perception: Attention normal.        Mood and Affect: Mood normal.    Wt Readings from Last 3 Encounters:  02/24/21 197 lb 12.8 oz (89.7 kg)  02/10/21 203 lb (92.1 kg)  01/25/21 198 lb (89.8 kg)    BP 128/70    Pulse 95    Ht 5' 3"  (1.6 m)    Wt 197 lb 12.8 oz (89.7 kg)    SpO2 99%    BMI 35.04 kg/m   Assessment and Plan: 1. Annual physical exam Exam is normal except for weight. Encourage regular exercise  and appropriate dietary  changes. - CBC with Differential/Platelet - Comprehensive metabolic panel - Hemoglobin A1c - Lipid panel - TSH  2. Encounter for screening mammogram for breast cancer Schedule at [facility] - MM 3D SCREEN BREAST BILATERAL  3. Encounter for screening for cervical cancer Obtained today - Cytology - PAP  4. Mild persistent asthma without complication Continue Pulmicort but increase to bid All albuterol as needed - Budesonide (PULMICORT FLEXHALER) 90 MCG/ACT inhaler; Inhale 1 puff into the lungs 2 (two) times daily.  Dispense: 1 each; Refill: 5 - albuterol (VENTOLIN HFA) 108 (90 Base) MCG/ACT inhaler; Inhale 2 puffs into the lungs every 6 (six) hours as needed for wheezing or shortness of breath.  Dispense: 1 each; Refill: 0  5. Rash Has the appearance of psoriasis but localized to the soles of the feet Will start with TAC bid. - triamcinolone cream (KENALOG) 0.1 %; Apply 1 application topically 2 (two) times daily. To rash on feet  Dispense: 30 g; Refill: 5   Partially dictated using Editor, commissioning. Any errors are unintentional.  Halina Maidens, MD [facility]  02/24/2021

## 2021-02-25 LAB — COMPREHENSIVE METABOLIC PANEL
ALT: 23 IU/L (ref 0–32)
AST: 19 IU/L (ref 0–40)
Albumin/Globulin Ratio: 1.6 (ref 1.2–2.2)
Albumin: 4.6 g/dL (ref 3.8–4.9)
Alkaline Phosphatase: 90 IU/L (ref 44–121)
BUN/Creatinine Ratio: 25 — ABNORMAL HIGH (ref 9–23)
BUN: 17 mg/dL (ref 6–24)
Bilirubin Total: 0.2 mg/dL (ref 0.0–1.2)
CO2: 25 mmol/L (ref 20–29)
Calcium: 9.4 mg/dL (ref 8.7–10.2)
Chloride: 101 mmol/L (ref 96–106)
Creatinine, Ser: 0.69 mg/dL (ref 0.57–1.00)
Globulin, Total: 2.9 g/dL (ref 1.5–4.5)
Glucose: 90 mg/dL (ref 70–99)
Potassium: 4.5 mmol/L (ref 3.5–5.2)
Sodium: 139 mmol/L (ref 134–144)
Total Protein: 7.5 g/dL (ref 6.0–8.5)
eGFR: 104 mL/min/{1.73_m2} (ref >59)

## 2021-02-25 LAB — CBC WITH DIFFERENTIAL/PLATELET
Basophils Absolute: 0.1 10*3/uL (ref 0.0–0.2)
Basos: 1 %
EOS (ABSOLUTE): 0.3 10*3/uL (ref 0.0–0.4)
Eos: 4 %
Hematocrit: 44.7 % (ref 34.0–46.6)
Hemoglobin: 14.6 g/dL (ref 11.1–15.9)
Immature Grans (Abs): 0 10*3/uL (ref 0.0–0.1)
Immature Granulocytes: 0 %
Lymphocytes Absolute: 2.4 10*3/uL (ref 0.7–3.1)
Lymphs: 32 %
MCH: 27.7 pg (ref 26.6–33.0)
MCHC: 32.7 g/dL (ref 31.5–35.7)
MCV: 85 fL (ref 79–97)
Monocytes Absolute: 0.8 10*3/uL (ref 0.1–0.9)
Monocytes: 10 %
Neutrophils Absolute: 4.1 10*3/uL (ref 1.4–7.0)
Neutrophils: 53 %
Platelets: 277 10*3/uL (ref 150–450)
RBC: 5.27 x10E6/uL (ref 3.77–5.28)
RDW: 13.6 % (ref 11.7–15.4)
WBC: 7.7 10*3/uL (ref 3.4–10.8)

## 2021-02-25 LAB — TSH: TSH: 2.89 u[IU]/mL (ref 0.450–4.500)

## 2021-02-25 LAB — LIPID PANEL
Chol/HDL Ratio: 2.9 ratio (ref 0.0–4.4)
Cholesterol, Total: 156 mg/dL (ref 100–199)
HDL: 53 mg/dL (ref >39)
LDL Chol Calc (NIH): 82 mg/dL (ref 0–99)
Triglycerides: 115 mg/dL (ref 0–149)
VLDL Cholesterol Cal: 21 mg/dL (ref 5–40)

## 2021-02-25 LAB — HEMOGLOBIN A1C
Est. average glucose Bld gHb Est-mCnc: 123 mg/dL
Hgb A1c MFr Bld: 5.9 % — ABNORMAL HIGH (ref 4.8–5.6)

## 2021-02-28 LAB — CYTOLOGY - PAP
Comment: NEGATIVE
Diagnosis: NEGATIVE
High risk HPV: NEGATIVE

## 2021-03-23 ENCOUNTER — Ambulatory Visit: Payer: Commercial Managed Care - PPO | Admitting: Family Medicine

## 2021-03-29 ENCOUNTER — Ambulatory Visit: Payer: Commercial Managed Care - PPO | Admitting: Family Medicine

## 2021-03-29 ENCOUNTER — Other Ambulatory Visit: Payer: Self-pay

## 2021-03-29 ENCOUNTER — Encounter: Payer: Self-pay | Admitting: Family Medicine

## 2021-03-29 VITALS — BP 136/78 | HR 105 | Ht 63.0 in | Wt 202.6 lb

## 2021-03-29 DIAGNOSIS — M7522 Bicipital tendinitis, left shoulder: Secondary | ICD-10-CM | POA: Diagnosis not present

## 2021-03-29 DIAGNOSIS — M7521 Bicipital tendinitis, right shoulder: Secondary | ICD-10-CM | POA: Diagnosis not present

## 2021-03-29 DIAGNOSIS — M7541 Impingement syndrome of right shoulder: Secondary | ICD-10-CM

## 2021-03-29 MED ORDER — MELOXICAM 15 MG PO TABS: 15.0000 mg | ORAL_TABLET | Freq: Every day | ORAL | 0 refills | Status: DC | PRN

## 2021-03-29 NOTE — Progress Notes (Signed)
?  ? ?  Primary Care / Sports Medicine Office Visit ? ?Patient Information:  ?Patient ID: Theresa Lester, female DOB: 1970-05-12 Age: 51 y.o. MRN: [id number]  ? ?Theresa Lester is a pleasant 51 y.o. female presenting with the following: ? ?Chief Complaint  ?Patient presents with  ? Shoulder Pain  ?  Right, Rotator Cuff impingement syndrome  ? ? ?Vitals:  ? 03/29/21 0956  ?BP: 136/78  ?Pulse: (!) 105  ?SpO2: 98%  ? ?Vitals:  ? 03/29/21 0956  ?Weight: 202 lb 9.6 oz (91.9 kg)  ?Height: '5\' 3"'$  (1.6 m)  ? ?Body mass index is 35.89 kg/m?. ? ?No results found.  ? ?Independent interpretation of notes and tests performed by another provider:  ? ?None ? ?Procedures performed:  ? ?None ? ?Pertinent History, Exam, Impression, and Recommendations:  ? ?Rotator cuff impingement syndrome, right ?Patient presents for follow-up to right shoulder pain, last visit on 02/10/2021 she had noted resolution of right upper extremity paresthesias, was advised scheduled meloxicam, start of home-based exercises, and follow-up. ? ?At today's visit she continues to note resolution of right upper extremity paresthesias, has been compliant with daily home exercises, did dose meloxicam scheduled.  She has noted near resolution of shoulder pain save for anterior shoulder with radiation to her anterior elbow on the right, new onset left anterior shoulder pain. ? ?Patient with negative Spurling's bilaterally, rotator cuff testing bilaterally benign with equivocal impingement noted right greater than left. ? ?At this stage her symptoms are most consistent with biceps tendinopathy with secondary/resolving rotator cuff related symptomatology.  We discussed treatment strategies inclusive of medication management, injections, at this stage she is amenable to both continued meloxicam, with a transition to as needed dosing, and formal physical therapy to advance her rehab.  She will return for follow-up in 1 month, for recalcitrant symptoms, can  consider advanced imaging versus corticosteroid injections. ? ?Biceps tendinitis of both shoulders ?See additional assessment(s) for plan details.  ? ?Orders & Medications ?Meds ordered this encounter  ?Medications  ? meloxicam (MOBIC) 15 MG tablet  ?  Sig: Take 1 tablet (15 mg total) by mouth daily as needed for pain.  ?  Dispense:  30 tablet  ?  Refill:  0  ? ?Orders Placed This Encounter  ?Procedures  ? Ambulatory referral to Physical Therapy  ?  ? ?Return in about 4 weeks (around 04/26/2021).  ?  ? ?Montel Culver, MD ? ? Primary Care Sports Medicine ?[facility] ?[place]  ? ?

## 2021-03-29 NOTE — Assessment & Plan Note (Addendum)
Patient presents for follow-up to right shoulder pain, last visit on 02/10/2021 she had noted resolution of right upper extremity paresthesias, was advised scheduled meloxicam, start of home-based exercises, and follow-up. ? ?At today's visit she continues to note resolution of right upper extremity paresthesias, has been compliant with daily home exercises, did dose meloxicam scheduled.  She has noted near resolution of shoulder pain save for anterior shoulder with radiation to her anterior elbow on the right, new onset left anterior shoulder pain. ? ?Patient with negative Spurling's bilaterally, rotator cuff testing bilaterally benign with equivocal impingement noted right greater than left. ? ?At this stage her symptoms are most consistent with biceps tendinopathy with secondary/resolving rotator cuff related symptomatology.  We discussed treatment strategies inclusive of medication management, injections, at this stage she is amenable to both continued meloxicam, with a transition to as needed dosing, and formal physical therapy to advance her rehab.  She will return for follow-up in 1 month, for recalcitrant symptoms, can consider advanced imaging versus corticosteroid injections. ?

## 2021-03-29 NOTE — Assessment & Plan Note (Signed)
See additional assessment(s) for plan details. 

## 2021-03-29 NOTE — Patient Instructions (Addendum)
-   Referral coordinator will contact you to schedule physical therapy, alternatively can contact the number below for expedited scheduling ?- Attend 1-2 times per week for PT, they will provide you new/updated exercises to perform daily ?- Transition the meloxicam (anti-inflammatory) to once daily on an as-needed basis for any arm pain that prohibits you from doing the exercises or pain that prevents you from doing normal daily tasks ?- Return for follow-up in 4 weeks, contact our office for any questions between now and then ? ?[facility]: ? ?[place]: [phone] ? ?

## 2021-04-04 ENCOUNTER — Telehealth: Payer: Self-pay | Admitting: Internal Medicine

## 2021-04-04 NOTE — Telephone Encounter (Signed)
Medication Refill - Medication: meloxicam (MOBIC) 15 MG tablet ? ?Has the patient contacted their pharmacy? Yes.   ? ?(Agent: If yes, when and what did the pharmacy advise?) Contact PCP and request prescription for 30 days only and please send to [facility] in [place] ? ?Preferred Pharmacy (with phone number or street name):  ?[id number] - [place], [place] [facility] [address] ? ?Has the patient been seen for an appointment in the last year OR does the patient have an upcoming appointment? Yes.   ? ?Agent: Please be advised that RX refills may take up to 3 business days. We ask that you follow-up with your pharmacy. ? ? ?

## 2021-04-04 NOTE — Telephone Encounter (Signed)
Called pt left VM that refill was placed 03/29/21. VM was stated on VM. ? ?KP ?

## 2021-04-21 ENCOUNTER — Ambulatory Visit
Admission: RE | Admit: 2021-04-21 | Discharge: 2021-04-21 | Disposition: A | Discharge status: Home / Self Care | Payer: Commercial Managed Care - PPO | Source: Ambulatory Visit | Attending: Internal Medicine | Admitting: Internal Medicine

## 2021-04-21 DIAGNOSIS — Z1231 Encounter for screening mammogram for malignant neoplasm of breast: Secondary | ICD-10-CM | POA: Insufficient documentation

## 2021-04-29 ENCOUNTER — Ambulatory Visit: Payer: Commercial Managed Care - PPO | Admitting: Physical Therapy

## 2021-05-03 ENCOUNTER — Ambulatory Visit: Payer: Commercial Managed Care - PPO | Admitting: Family Medicine

## 2021-05-04 NOTE — Patient Instructions (Incomplete)
M75.21,M75.22 (ICD-10-CM) - Biceps tendinitis of both shoulders  ?M75.41 (ICD-10-CM) - Rotator cuff impingement syndrome, right  ? ?INTERVENTIONS ?- Soft tissue mobilization ?- Scapula setting: retraction, elevation, depression ?- Joint mobilization ?- Increase shoulder ROM - Decrease posterior capsule tightness ?- Strengthening program consisting of heavy slow loading should begin with emphasis on the scapular stabilizers, rotator cuff and biceps tendon ?- Isometric>eccentric>concentric  ?*Painful activities such as abduction and overhead activities should be avoided in the early stages  ?

## 2021-05-05 ENCOUNTER — Ambulatory Visit: Payer: Commercial Managed Care - PPO | Admitting: Physical Therapy

## 2021-05-10 ENCOUNTER — Encounter: Payer: Commercial Managed Care - PPO | Admitting: Physical Therapy

## 2021-05-13 ENCOUNTER — Ambulatory Visit: Payer: Commercial Managed Care - PPO | Attending: Family Medicine | Admitting: Physical Therapy

## 2021-05-13 ENCOUNTER — Encounter: Payer: Self-pay | Admitting: Physical Therapy

## 2021-05-13 ENCOUNTER — Encounter: Payer: Commercial Managed Care - PPO | Admitting: Physical Therapy

## 2021-05-13 DIAGNOSIS — M25511 Pain in right shoulder: Secondary | ICD-10-CM | POA: Diagnosis present

## 2021-05-13 DIAGNOSIS — G8929 Other chronic pain: Secondary | ICD-10-CM | POA: Insufficient documentation

## 2021-05-13 DIAGNOSIS — M6281 Muscle weakness (generalized): Secondary | ICD-10-CM | POA: Insufficient documentation

## 2021-05-13 DIAGNOSIS — M25512 Pain in left shoulder: Secondary | ICD-10-CM | POA: Insufficient documentation

## 2021-05-13 NOTE — Therapy (Signed)
[place] [facility] ?[address] ?[place], [place], [address] ?Phone: [phone]   Fax:  [phone] ? ?Physical Therapy Evaluation ? ?Patient Details  ?Name: Trinidee Schrag ?MRN: [id number] ?Date of Birth: 1970/07/06 ?No data recorded ? ?Encounter Date: 05/13/2021 ? ? PT End of Session - 05/13/21 1357   ? ? Visit Number 1   ? Number of Visits 17   ? Date for PT Re-Evaluation 07/08/21   ? Progress Note Due on Visit 10   ? PT Start Time [id number]   ? PT Stop Time 0916   ? PT Time Calculation (min) 37 min   ? Activity Tolerance Patient tolerated treatment well   ? Behavior During Therapy [facility] for tasks assessed/performed   ? ?  ?  ? ?  ? ? ?Past Medical History:  ?Diagnosis Date  ? Acoustic neuroma ([place])   ? Arthralgia of hip or thigh 10/19/2014  ? Blood glucose elevated 10/19/2014  ? A1C 5.7 [08/2013]   ? Calculus of gallbladder   ? Encounter for screening colonoscopy   ? Fast heart beat 10/19/2014  ? TFTs normal [02/2014]   ? GERD (gastroesophageal reflux disease)   ? Hemorrhoids, internal   ? Left axillary hidradenitis 11/05/2015  ? Restless leg   ? Tinnitus 11/25/2014  ? Tuberculosis   ? ? ?Past Surgical History:  ?Procedure Laterality Date  ? BILATERAL CARPAL TUNNEL RELEASE    ? COLONOSCOPY WITH PROPOFOL N/A 11/01/2018  ? Procedure: COLONOSCOPY WITH BIOPSY;  Surgeon: [name], MD;  Location: [place];  Service: Endoscopy;  Laterality: N/A;  ? KNEE ARTHROSCOPY WITH MEDIAL MENISECTOMY Left 01/18/2017  ? Procedure: KNEE ARTHROSCOPY WITH MEDIAL MENISECTOMY ROOT REPAIR CHONDROPLASTY;  Surgeon: Leim Fabry, MD;  Location: [place];  Service: Orthopedics;  Laterality: Left;  SUPINE WITH ACL LEG HOLDER ?[place], MENISCUS ROOT GUIDE ?STRYKER [place] CETERIX AIR  ?ANESTHESIA: ADDUCTOR CANAL NERVE BLOCK  ? KNEE ARTHROSCOPY WITH MEDIAL MENISECTOMY Right 07/16/2018  ? Procedure: KNEE ARTHROSCOPY WITH MEDIAL MENISCUS  ROOT REPAIR;   Surgeon: Leim Fabry, MD;  Location: [place];  Service: Orthopedics;  Laterality: Right;  MENSICUS ROOT GUIDE KIT + SWIVELOCKS,  SWIVELOCK TIBIAL FIXATION ?CETERIX ?OPEN KNEE TRAY ?BOVIE ELECTROCAUTERY  ? POLYPECTOMY N/A 11/01/2018  ? Procedure: POLYPECTOMY;  Surgeon: [name], MD;  Location: [place];  Service: Endoscopy;  Laterality: N/A;  ? TUBAL LIGATION    ? VESTIBULAR NERVE SECTION Right 2017  ? ? ?There were no vitals filed for this visit. ? ? ? Subjective Assessment - 05/13/21 1340   ? ? Subjective bilateral biceps tendonitis   ? Pertinent History Pt arrives to PT reporting bilateral shoulder pain that has been present for at least a year, maybe two. Insidious onset occurred when pt was working at [employer] where she was required to lift large and sometimes heavy boxes. Pain began in right shoulder and eventually progressed to affect both shoulders. She no longer works at [employer] 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 Pain has decreased over time however continues to persist. Her MD prescribed Meloxicam which has eliminated the N&T that she used to experience. She described right pain is typically worse than left. Pain is more proximal on left in the anterior shoulder; pain is  proximal and distal into the elbow on the right side.   ? Limitations Lifting;House hold activities   ? How long can you sit comfortably? N/A   ? How long can you stand comfortably? N/A   ? How long can you walk comfortably? N/A   ? Diagnostic tests x-ray 01/25/21 - degenerative changes in shoulders and cervical spine   ? Patient Stated Goals to dececrease pain   ? Currently in Pain? Yes   ? Pain Score 5    ? Pain Location Shoulder   ? Pain Orientation Right;Left   ? Pain Descriptors / Indicators Sharp   ? Pain Type Chronic pain   ? Pain Onset More than a month  ago   ? Pain Frequency Intermittent   ? Aggravating Factors  sleeping on her side, prolonged immobility, heavy lifting (ceramic pots for plants)   ? Pain Relieving Factors stretching, Meloxicam   ? ?  ?  ? ?  ? ? ? ? ? ?SUBJECTIVE ?Chief complaint: Pt arrives to PT reporting bilateral shoulder pain that has been present for at least a year, maybe two. Insidious onset occurred when pt was working at [employer] where she was required to lift large and sometimes heavy boxes. Pain began in right shoulder and eventually progressed to affect both shoulders. She no longer works at [employer] 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 Pain has decreased over time however continues to persist. Her MD prescribed Meloxicam which has eliminated the N&T that she used to experience. She described right pain is typically worse than left. Pain is more proximal on left in the anterior shoulder; pain is proximal and distal into the elbow on the right side.  ? ?Onset: >1 year ?Shoulder trauma: No  ?Pain quality: sharp ?Pain: 5/10 Present, 3/10 Best, 7/10 Worst ?Aggravating factors: sleeping on her side, prolonged immobility, heavy lifting (ceramic pots for plants) ?Easing factors: stretching, Meloxicam ?24 hour pain behavior: sore in the morning, worst in the evenings (sitting a lot in the evenings) ?Radiating pain: No - did before taking Meloxicam  ?Numbness/Tingling: No - did before taking Meloxicam  ?Prior history of shoulder injury or pain: No ?Prior history of therapy for shoulder: No ?Follow-up appointment with MD: Yes - in ~1 month  ?Dominant hand: Right ? ?OBJECTIVE ? ?MUSCULOSKELETAL: ?Tremor: Normal ?Bulk: Normal ?Tone: Normal ? ?Cervical Screen ?AROM w/ OP: next session  ?Spurlings A (ipsilateral lateral flexion/axial compression): next session ? ? ?Elbow Screen ?Elbow AROM: Within  Normal Limits ? ?Palpation ?No pain with palpation to anterior, posterior, lateral, and superior shoulder.  ? ?Strength ?R/L ?4*/4-* Shoulder flexion  ?4/4-* Shoulder abduction  ?4/4 Shoulder external rotation  ?5/5 Shoulder internal rotation  ?4-/4- Shoulder extension  ?4+/4+* Shoulder horizontal adduction ?5/5 Elbow flexion  ?5/5 Elbow extension  ? ?AROM ?R/L ?160/140 Shoulder flexion ?165/115 Shoulder abduction ?[facility] Shoulder external rotation ?[facility] Shoulder internal rotation ?31/40 Shoulder extension ?*Indicates pain, overpressure performed unless otherwise indicated ? ?PROM ?R/L ?169*/148* Shoulder flexion ?172*/125* Shoulder abduction ?[facility] Shoulder external rotation ?[facility] Shoulder internal rotation ?42/53 Shoulder extension ?*Indicates pain, overpressure performed unless otherwise indicated ? ?Accessory Motions/Glides ?Glenohumeral: ?Posterior: R: normal L: normal ?Inferior: R: normal L: normal ?Anterior: R: normal L: normal ? ?Muscle Length Testing ?Pectoralis Major: R: normal L: abnormal ?Pectoralis Minor: R: normal L: normal  ?Biceps:  R: normal L: normal ? ? ?NEUROLOGICAL: ? ?Sensation ?Grossly intact to light touch bilateral UE as determined by testing dermatomes C2-T2 ? ? ? ?SPECIAL TESTS ? ?Subacromial Impingement ?Hawkins-Kennedy: Negative ?Painful Arc (Pain from 60 to 120 degrees scaption): Positive - bilateral, L more painful than R ?Empty Can: Negative ?External Rotation Resistance: Negative ?Horizontal Adduction: Positive on LUE ? ? ?Bicep Tendon Pathology ?Speed (shoulder flexion to 90, external rotation, full elbow extension, and forearm supination with resistance: Negative ?Yergason's (resisted shoulder ER and supination/biceps tendon pathology): Negative ? ? ?Outcome Measures ?Quick DASH: 22.7% (21 points) ?  ? ? ?ASSESSMENT ?Pt is a pleasant 51 year-old female referred to PT for chronic bilateral shoulder pain, R typically worse than L however left seems to be more painful this date upon  evaluation. Recent imaging indicates degenerative changes in the shoulders and cervical spine. PT examination reveals deficits in [facility] strength and ROM, pain with testing. Mild limitations in flexibility of the pecs.

## 2021-05-17 ENCOUNTER — Encounter: Payer: Self-pay | Admitting: Physical Therapy

## 2021-05-17 ENCOUNTER — Ambulatory Visit: Payer: Commercial Managed Care - PPO | Attending: Family Medicine | Admitting: Physical Therapy

## 2021-05-17 DIAGNOSIS — M25512 Pain in left shoulder: Secondary | ICD-10-CM | POA: Insufficient documentation

## 2021-05-17 DIAGNOSIS — M6281 Muscle weakness (generalized): Secondary | ICD-10-CM | POA: Diagnosis present

## 2021-05-17 DIAGNOSIS — M25511 Pain in right shoulder: Secondary | ICD-10-CM | POA: Diagnosis present

## 2021-05-17 DIAGNOSIS — G8929 Other chronic pain: Secondary | ICD-10-CM | POA: Diagnosis present

## 2021-05-17 NOTE — Therapy (Signed)
[facility] ?[address] ?[place], [place], [address] ?Phone: [phone]   Fax:  [phone] ? ?Physical Therapy Treatment ? ?Patient Details  ?Name: Theresa Lester ?MRN: [id number] ?Date of Birth: 12-22-1970 ?No data recorded ? ?Encounter Date: 05/17/2021 ? ? PT End of Session - 05/17/21 1006   ? ? Visit Number 2   ? Number of Visits 17   ? Date for PT Re-Evaluation 07/08/21   ? Progress Note Due on Visit 10   ? PT Start Time 1002   ? PT Stop Time 1045   ? PT Time Calculation (min) 43 min   ? Activity Tolerance Patient tolerated treatment well   ? Behavior During Therapy [facility] for tasks assessed/performed   ? ?  ?  ? ?  ? ? ?Past Medical History:  ?Diagnosis Date  ? Acoustic neuroma ([place])   ? Arthralgia of hip or thigh 10/19/2014  ? Blood glucose elevated 10/19/2014  ? A1C 5.7 [08/2013]   ? Calculus of gallbladder   ? Encounter for screening colonoscopy   ? Fast heart beat 10/19/2014  ? TFTs normal [02/2014]   ? GERD (gastroesophageal reflux disease)   ? Hemorrhoids, internal   ? Left axillary hidradenitis 11/05/2015  ? Restless leg   ? Tinnitus 11/25/2014  ? Tuberculosis   ? ? ?Past Surgical History:  ?Procedure Laterality Date  ? BILATERAL CARPAL TUNNEL RELEASE    ? COLONOSCOPY WITH PROPOFOL N/A 11/01/2018  ? Procedure: COLONOSCOPY WITH BIOPSY;  Surgeon: [name], MD;  Location: [place];  Service: Endoscopy;  Laterality: N/A;  ? KNEE ARTHROSCOPY WITH MEDIAL MENISECTOMY Left 01/18/2017  ? Procedure: KNEE ARTHROSCOPY WITH MEDIAL MENISECTOMY ROOT REPAIR CHONDROPLASTY;  Surgeon: Leim Fabry, MD;  Location: [place];  Service: Orthopedics;  Laterality: Left;  SUPINE WITH ACL LEG HOLDER ?[place], MENISCUS ROOT GUIDE ?[place]ER [place] CETERIX AIR  ?ANESTHESIA: ADDUCTOR CANAL NERVE BLOCK  ? KNEE ARTHROSCOPY WITH MEDIAL MENISECTOMY Right 07/16/2018  ? Procedure: KNEE ARTHROSCOPY WITH MEDIAL MENISCUS  ROOT REPAIR;   Surgeon: Leim Fabry, MD;  Location: [place];  Service: Orthopedics;  Laterality: Right;  MENSICUS ROOT GUIDE KIT + SWIVELOCKS,  SWIVELOCK TIBIAL FIXATION ?CETERIX ?OPEN KNEE TRAY ?BOVIE ELECTROCAUTERY  ? POLYPECTOMY N/A 11/01/2018  ? Procedure: POLYPECTOMY;  Surgeon: [name], MD;  Location: [place];  Service: Endoscopy;  Laterality: N/A;  ? TUBAL LIGATION    ? VESTIBULAR NERVE SECTION Right 2017  ? ? ?There were no vitals filed for this visit. ? ? Subjective Assessment - 05/17/21 1005   ? ? Subjective Pt states she is well today. Denies pain. She took Meloxicam before coming in. No changes/updates since last session.   ? Pertinent History Pt arrives to PT reporting bilateral shoulder pain that has been present for at least a year, maybe two. Insidious onset occurred when pt was working at [employer] where she was required to lift large and sometimes heavy boxes. Pain began in right shoulder and eventually progressed to affect both shoulders. She no longer works at [employer] 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 Pain has decreased over time however continues to persist. Her MD prescribed Meloxicam which has eliminated the N&T that she used to experience. She described right pain is typically  worse than left. Pain is more proximal on left in the anterior shoulder; pain is proximal and distal into the elbow on the right side.   ? Limitations Lifting;House hold activities   ? How long can you sit comfortably? N/A   ? How long can you stand comfortably? N/A   ? How long can you walk comfortably? N/A   ? Diagnostic tests x-ray 01/25/21 - degenerative changes in shoulders and cervical spine   ? Patient Stated Goals to dececrease pain   ? Currently in Pain? No/denies   ? Pain Onset More than a month ago   ? ?  ?  ? ?  ? ? ? ?OBJECTIVE:  ?Spurlings:  negative bilaterally ?MMT: ?Lower trap 4-/4- ?Middle trap 4-/4- ? ? ? ?INTERVENTIONS ? ?-Arm ergometer level 3 for 5 minutes, changing direction midway. ? ?-standing row, BlueTB, 3x10 ?-shoulder extension, GreenTB, 3x10 ?-ER RedTB, 3x10 ? ?-GH flexion to 90d 2# DB, 3x10 ?-GH abduction to 90d 2# DB ? ?-prone I, T and Y, 3x10 ? -Y performed 1 side at a time due to challenge ? ?-flexion and abduction wall glides, performed bilateral UE, x10 reps each  ? ?Created HEP; performed within session to ensure understanding.  ?Access Code: [email or web address] ? ? ?Next Session:  ?continue strengthening, flexibility, GH ROM  ? ? ? ? ?Clinical Impression: Pt is pleasant and motivated throughout session. She remained pain-free with all exercises; did report fatigue in muscles as she was performing strengthening. PT provided printout and educated on frequency to perform. Pt demonstrated good technique and understanding through performance. Pt will benefit from PT services to address deficits in strength, mobility and pain in order to return to full function at home with less shoulder pain.  ? ? ? ? ? ? ? ? ? PT Short Term Goals - 05/13/21 1429   ? ?  ? PT SHORT TERM GOAL #1  ? Title Pt will be independent with HEP in order to improve strength and decrease pain in order to improve pain-free function at home and work.   ? Time 4   ? Period Weeks   ? Status New   ? Target Date 06/10/21   ? ?  ?  ? ?  ? ? ? ? PT Long Term Goals - 05/13/21 1429   ? ?  ? PT LONG TERM GOAL #1  ? Title Patient will increase FOTO score to equal to or greater than 73 to demonstrate statistically significant improvement in mobility and quality of life.   ? Baseline 05/13/21: 67   ? Time 8   ? Period Weeks   ? Status New   ? Target Date 07/08/21   ?  ? PT LONG TERM GOAL #2  ? Title Pt will decrease quick DASH score by at least 8% in order to demonstrate clinically significant reduction in disability.   ? Baseline 05/13/21: 22.7%   ? Time 8   ? Period Weeks   ? Status New    ? Target Date 07/08/21   ?  ? PT LONG TERM GOAL #3  ? Title Pt will decrease worst pain as reported on NPRS by at least 3 points in order to demonstrate clinically significant reduction in pain.   ? Baseline 05/13/21: 7/10 NPS   ? Time 8   ? Period Weeks   ? Status New   ? Target Date 07/08/21   ?  ? PT LONG TERM GOAL #4  ? Title Pt will increase strength  of bilteral GH flexion, abduction, ER and extension by at least 1/2 MMT grade in order to demonstrate improvement in strength and function   ? Baseline 05/13/21: general 4 and 4- throughout (please refer to eval note)   ? Time 8   ? Period Weeks   ? Status New   ? Target Date 07/08/21   ?  ? PT LONG TERM GOAL #5  ? Title Pt will increase bilateral GH AROM flexion and abduction to at least 165d for functional use and overhead reaching activities.   ? Baseline 05/13/21: flexion R 160d, L 140d; abduction R 165, L 115   ? Time 8   ? Period Weeks   ? Status New   ? Target Date 07/08/21   ? ?  ?  ? ?  ? ? ? ? ? ? ? ? Plan - 05/17/21 1236   ? ? Clinical Impression Statement Pt is pleasant and motivated throughout session. She remained pain-free with all exercises; did report fatigue in muscles as she was performing strengthening. PT provided printout and educated on frequency to perform. Pt demonstrated good technique and understanding through performance. Pt will benefit from PT services to address deficits in strength, mobility and pain in order to return to full function at home with less shoulder pain.   ? Personal Factors and Comorbidities Age;Time since onset of injury/illness/exacerbation;Fitness   ? Examination-Activity Limitations Bathing;Reach Overhead;Sleep;Lift;Carry   ? Examination-Participation Restrictions Community Activity;Shop   ? Stability/Clinical Decision Making Stable/Uncomplicated   ? Rehab Potential Good   ? PT Frequency 2x / week   ? PT Duration 8 weeks   ? PT Treatment/Interventions ADLs/Self Care Home Management;Aquatic  Therapy;Cryotherapy;Electrical Stimulation;Iontophoresis 59m/ml Dexamethasone;Moist Heat;Traction;Ultrasound;Functional mobility training;Therapeutic activities;Therapeutic exercise;Neuromuscular re-education;Patient/family education

## 2021-05-19 ENCOUNTER — Encounter: Payer: Self-pay | Admitting: Physical Therapy

## 2021-05-19 ENCOUNTER — Ambulatory Visit: Payer: Commercial Managed Care - PPO | Admitting: Physical Therapy

## 2021-05-19 DIAGNOSIS — M6281 Muscle weakness (generalized): Secondary | ICD-10-CM

## 2021-05-19 DIAGNOSIS — G8929 Other chronic pain: Secondary | ICD-10-CM

## 2021-05-19 NOTE — Therapy (Signed)
[place] [facility] ?[address] ?[place], [place], [address] ?Phone: [phone]   Fax:  [phone] ? ?Physical Therapy Treatment ? ?Patient Details  ?Name: Theresa Lester ?MRN: [id number] ?Date of Birth: 21-Jun-1970 ?No data recorded ? ?Encounter Date: 05/19/2021 ? ? PT End of Session - 05/19/21 1102   ? ? Visit Number 3   ? Number of Visits 17   ? Date for PT Re-Evaluation 07/08/21   ? Progress Note Due on Visit 10   ? PT Start Time 1001   ? PT Stop Time 1045   ? PT Time Calculation (min) 44 min   ? Activity Tolerance Patient tolerated treatment well   ? Behavior During Therapy [facility] for tasks assessed/performed   ? ?  ?  ? ?  ? ? ?Past Medical History:  ?Diagnosis Date  ? Acoustic neuroma ([place])   ? Arthralgia of hip or thigh 10/19/2014  ? Blood glucose elevated 10/19/2014  ? A1C 5.7 [08/2013]   ? Calculus of gallbladder   ? Encounter for screening colonoscopy   ? Fast heart beat 10/19/2014  ? TFTs normal [02/2014]   ? GERD (gastroesophageal reflux disease)   ? Hemorrhoids, internal   ? Left axillary hidradenitis 11/05/2015  ? Restless leg   ? Tinnitus 11/25/2014  ? Tuberculosis   ? ? ?Past Surgical History:  ?Procedure Laterality Date  ? BILATERAL CARPAL TUNNEL RELEASE    ? COLONOSCOPY WITH PROPOFOL N/A 11/01/2018  ? Procedure: COLONOSCOPY WITH BIOPSY;  Surgeon: [name], MD;  Location: [facility] [facility];  Service: Endoscopy;  Laterality: N/A;  ? KNEE ARTHROSCOPY WITH MEDIAL MENISECTOMY Left 01/18/2017  ? Procedure: KNEE ARTHROSCOPY WITH MEDIAL MENISECTOMY ROOT REPAIR CHONDROPLASTY;  Surgeon: Leim Fabry, MD;  Location: [facility];  Service: Orthopedics;  Laterality: Left;  SUPINE WITH ACL LEG HOLDER ?[place], MENISCUS ROOT GUIDE ?STRYKER [place] CETERIX AIR  ?ANESTHESIA: ADDUCTOR CANAL NERVE BLOCK  ? KNEE ARTHROSCOPY WITH MEDIAL MENISECTOMY Right 07/16/2018  ? Procedure: KNEE ARTHROSCOPY WITH MEDIAL MENISCUS  ROOT REPAIR;   Surgeon: Leim Fabry, MD;  Location: [facility];  Service: Orthopedics;  Laterality: Right;  MENSICUS ROOT GUIDE KIT + SWIVELOCKS,  SWIVELOCK TIBIAL FIXATION ?CETERIX ?OPEN KNEE TRAY ?BOVIE ELECTROCAUTERY  ? POLYPECTOMY N/A 11/01/2018  ? Procedure: POLYPECTOMY;  Surgeon: [name], MD;  Location: [facility];  Service: Endoscopy;  Laterality: N/A;  ? TUBAL LIGATION    ? VESTIBULAR NERVE SECTION Right 2017  ? ? ?There were no vitals filed for this visit. ? ? Subjective Assessment - 05/19/21 1007   ? ? Subjective Pt states she is well today. Denies pain over the past couple days - states she was not sore following last session.   ? Pertinent History Pt arrives to PT reporting bilateral shoulder pain that has been present for at least a year, maybe two. Insidious onset occurred when pt was working at [employer] where she was required to lift large and sometimes heavy boxes. Pain began in right shoulder and eventually progressed to affect both shoulders. She no longer works at [employer] 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 Pain has decreased over time however continues to persist. Her MD prescribed Meloxicam which has eliminated the N&T that she used to experience. She described right  pain is typically worse than left. Pain is more proximal on left in the anterior shoulder; pain is proximal and distal into the elbow on the right side.   ? Limitations Lifting;House hold activities   ? How long can you sit comfortably? N/A   ? How long can you stand comfortably? N/A   ? How long can you walk comfortably? N/A   ? Diagnostic tests x-ray 01/25/21 - degenerative changes in shoulders and cervical spine   ? Patient Stated Goals to dececrease pain   ? Currently in Pain? No/denies   ? Pain Onset More than a month ago   ? ?  ?  ? ?  ? ? ? ? ? ? ?INTERVENTIONS ?   ?-Arm ergometer level 3 for 5 minutes, changing direction midway. ?  ?-pectoral stretch at doorway, 2x30 seconds ? ?-prone W, T and Y, 3x10 BUE ? ?-standing cable row, 20#, 3x10 ? VC for 3 sec eccentric control  ? ?-ER BUE RedTB, 2x10 ?-GH flexion BUE to 90d 3# DB, 2x10 ?-GH abduction BUE to 90d 3# DB, 2x10 ?  ?-UT stretch x30 sec each ?-levator scap stretch x30 sec each  ?-PB rollout for shoulder flexion stretch, x60 seconds ?  ?Access Code: [email or web address] (no updates) ?  ?  ?Next Session:  ?continue strengthening, flexibility, GH ROM  ?  ?  ?  ?  ?Clinical Impression: Pt is pleasant and motivated throughout session. POC was progressed this date as pt tolerated last session well. She did report mild discomfort in right posterior shoulder and neck. PT removed third set of shoulder strengthening (flexion, abduction, ER) to include a few stretches at end of session due to reported discomfort. Encouraged pt to perform HEP at least once over the weekend. Pt will benefit from PT services to address deficits in strength, mobility and pain in order to return to full function at home with less shoulder pain.  ? ? ? ? ? ? ? ? ? ? PT Short Term Goals - 05/13/21 1429   ? ?  ? PT SHORT TERM GOAL #1  ? Title Pt will be independent with HEP in order to improve strength and decrease pain in order to improve pain-free function at home and work.   ? Time 4   ? Period Weeks   ? Status New   ? Target Date 06/10/21   ? ?  ?  ? ?  ? ? ? ? PT Long Term Goals - 05/13/21 1429   ? ?  ? PT LONG TERM GOAL #1  ? Title Patient will increase FOTO score to equal to or greater than 73 to demonstrate statistically significant improvement in mobility and quality of life.   ? Baseline 05/13/21: 67   ? Time 8   ? Period Weeks   ? Status New   ? Target Date 07/08/21   ?  ? PT LONG TERM GOAL #2  ? Title Pt will decrease quick DASH score by at least 8% in order to demonstrate clinically significant reduction in disability.   ? Baseline 05/13/21: 22.7%   ? Time  8   ? Period Weeks   ? Status New   ? Target Date 07/08/21   ?  ? PT LONG TERM GOAL #3  ? Title Pt will decrease worst pain as reported on NPRS by at least 3 points in order to demonstrate clinically significant reduction in pain.   ? Baseline 05/13/21: 7/10 NPS   ? Time 8   ?  Period Weeks   ? Status New   ? Target Date 07/08/21   ?  ? PT LONG TERM GOAL #4  ? Title Pt will increase strength of bilteral GH flexion, abduction, ER and extension by at least 1/2 MMT grade in order to demonstrate improvement in strength and function   ? Baseline 05/13/21: general 4 and 4- throughout (please refer to eval note)   ? Time 8   ? Period Weeks   ? Status New   ? Target Date 07/08/21   ?  ? PT LONG TERM GOAL #5  ? Title Pt will increase bilateral GH AROM flexion and abduction to at least 165d for functional use and overhead reaching activities.   ? Baseline 05/13/21: flexion R 160d, L 140d; abduction R 165, L 115   ? Time 8   ? Period Weeks   ? Status New   ? Target Date 07/08/21   ? ?  ?  ? ?  ? ? ? ? ? ? ? ? Plan - 05/19/21 1102   ? ? Clinical Impression Statement Pt is pleasant and motivated throughout session. POC was progressed this date as pt tolerated last session well. She did report mild discomfort in right posterior shoulder and neck. PT removed third set of shoulder strengthening (flexion, abduction, ER) to include a few stretches at end of session due to reported discomfort. Encouraged pt to perform HEP at least once over the weekend. Pt will benefit from PT services to address deficits in strength, mobility and pain in order to return to full function at home with less shoulder pain.   ? Personal Factors and Comorbidities Age;Time since onset of injury/illness/exacerbation;Fitness   ? Examination-Activity Limitations Bathing;Reach Overhead;Sleep;Lift;Carry   ? Examination-Participation Restrictions Community Activity;Shop   ? Stability/Clinical Decision Making Stable/Uncomplicated   ? Rehab Potential Good   ? PT  Frequency 2x / week   ? PT Duration 8 weeks   ? PT Treatment/Interventions ADLs/Self Care Home Management;Aquatic Therapy;Cryotherapy;Electrical Stimulation;Iontophoresis 26m/ml Dexamethasone;Moist Heat;Traction;Ult

## 2021-05-24 ENCOUNTER — Ambulatory Visit: Payer: Commercial Managed Care - PPO | Admitting: Physical Therapy

## 2021-05-24 ENCOUNTER — Encounter: Payer: Self-pay | Admitting: Physical Therapy

## 2021-05-24 DIAGNOSIS — G8929 Other chronic pain: Secondary | ICD-10-CM

## 2021-05-24 DIAGNOSIS — M6281 Muscle weakness (generalized): Secondary | ICD-10-CM | POA: Diagnosis not present

## 2021-05-24 NOTE — Therapy (Signed)
[place] [facility] ?[address] ?[place], [place], [address] ?Phone: [phone]   Fax:  [phone] ? ?Physical Therapy Treatment ? ?Patient Details  ?Name: Theresa Lester ?MRN: [id number] ?Date of Birth: 1970/11/19 ?No data recorded ? ?Encounter Date: 05/24/2021 ? ? PT End of Session - 05/24/21 0917   ? ? Visit Number 4   ? Number of Visits 17   ? Date for PT Re-Evaluation 07/08/21   ? Authorization - Number of Visits 4   ? Progress Note Due on Visit 10   ? PT Start Time 6759   ? PT Stop Time 0955   ? PT Time Calculation (min) 40 min   ? Activity Tolerance Patient tolerated treatment well   ? Behavior During Therapy [facility] for tasks assessed/performed   ? ?  ?  ? ?  ? ? ?Past Medical History:  ?Diagnosis Date  ? Acoustic neuroma ([place])   ? Arthralgia of hip or thigh 10/19/2014  ? Blood glucose elevated 10/19/2014  ? A1C 5.7 [08/2013]   ? Calculus of gallbladder   ? Encounter for screening colonoscopy   ? Fast heart beat 10/19/2014  ? TFTs normal [02/2014]   ? GERD (gastroesophageal reflux disease)   ? Hemorrhoids, internal   ? Left axillary hidradenitis 11/05/2015  ? Restless leg   ? Tinnitus 11/25/2014  ? Tuberculosis   ? ? ?Past Surgical History:  ?Procedure Laterality Date  ? BILATERAL CARPAL TUNNEL RELEASE    ? COLONOSCOPY WITH PROPOFOL N/A 11/01/2018  ? Procedure: COLONOSCOPY WITH BIOPSY;  Surgeon: [name], MD;  Location: [facility];  Service: Endoscopy;  Laterality: N/A;  ? KNEE ARTHROSCOPY WITH MEDIAL MENISECTOMY Left 01/18/2017  ? Procedure: KNEE ARTHROSCOPY WITH MEDIAL MENISECTOMY ROOT REPAIR CHONDROPLASTY;  Surgeon: Leim Fabry, MD;  Location: [facility] [place];  Service: Orthopedics;  Laterality: Left;  SUPINE WITH ACL LEG HOLDER ?[place], MENISCUS ROOT GUIDE ?[place] CETERIX AIR  ?ANESTHESIA: ADDUCTOR CANAL NERVE BLOCK  ? KNEE ARTHROSCOPY WITH MEDIAL MENISECTOMY Right 07/16/2018  ? Procedure: KNEE ARTHROSCOPY  WITH MEDIAL MENISCUS  ROOT REPAIR;  Surgeon: Leim Fabry, MD;  Location: [place];  Service: Orthopedics;  Laterality: Right;  MENSICUS ROOT GUIDE KIT + SWIVELOCKS,  SWIVELOCK TIBIAL FIXATION ?CETERIX ?OPEN KNEE TRAY ?BOVIE ELECTROCAUTERY  ? POLYPECTOMY N/A 11/01/2018  ? Procedure: POLYPECTOMY;  Surgeon: [name], MD;  Location: [facility];  Service: Endoscopy;  Laterality: N/A;  ? TUBAL LIGATION    ? VESTIBULAR NERVE SECTION Right 2017  ? ? ?There were no vitals filed for this visit. ? ? Subjective Assessment - 05/24/21 0909   ? ? Subjective Patient reports R should pain > L; 7/10 on R shoulder, 3/10 on L shoulder. Pt worked yesterday but reports she did not do anything differently. She is completing her HEP regularly and is doing well with it.   ? Pertinent History Pt arrives to PT reporting bilateral shoulder pain that has been present for at least a year, maybe two. Insidious onset occurred when pt was working at [employer] where she was required to lift large and sometimes heavy boxes. Pain began in right shoulder and eventually progressed to affect both shoulders. She no longer works at [employer] 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 Pain  has decreased over time however continues to persist. Her MD prescribed Meloxicam which has eliminated the N&T that she used to experience. She described right pain is typically worse than left. Pain is more proximal on left in the anterior shoulder; pain is proximal and distal into the elbow on the right side.   ? Limitations Lifting;House hold activities   ? How long can you sit comfortably? N/A   ? How long can you stand comfortably? N/A   ? How long can you walk comfortably? N/A   ? Diagnostic tests x-ray 01/25/21 - degenerative changes in shoulders and cervical spine   ? Patient Stated Goals to  dececrease pain   ? Pain Onset More than a month ago   ? ?  ?  ? ?  ? ? ?INTERVENTIONS ?  ?-Arm ergometer level 3 for 5 minutes, changing direction midway. ? ?Manual ~ 82mns ?Grade 2 post mobilization 3x 30sec each GHJ for pain reduction ?Grade 3 with flexion 229ms each with full PROM of R shoulder; approx 170d with L shoulder ?Cross friction massage to R prox bicep tendon ? ? - ER BUE RedTB, 2x 10 with min cuing to keep "elbows pinned to side" ?- Good mornings PVC x6 with good carry over of cuing for scapular retraction  ?- Carry over of GM to deadlift with PVC pipe only x8 with good carry over of demo and cuing for initial technique ?- Deadlift with 7# DB in each hand ? ?- post shoulder rolls x20  ?-pectoral stretch at doorway, 2x30 seconds ?  ?Education on carry over of therex from todays session in functional lifting and reaching with demo and verbalized understanding from patient ?  ?Access Code: [email or web address] (no updates) ?  ?  ? ? ? ? ? ? ? ? ? ? ? ? ? ? ? ? ? ? ? ? ? ? ? ? ? ? PT Education - 05/24/21 0916   ? ? Education Details therex form/technique   ? Person(s) Educated Patient   ? Methods Explanation;Demonstration;Verbal cues   ? Comprehension Verbalized understanding;Returned demonstration;Verbal cues required   ? ?  ?  ? ?  ? ? ? PT Short Term Goals - 05/13/21 1429   ? ?  ? PT SHORT TERM GOAL #1  ? Title Pt will be independent with HEP in order to improve strength and decrease pain in order to improve pain-free function at home and work.   ? Time 4   ? Period Weeks   ? Status New   ? Target Date 06/10/21   ? ?  ?  ? ?  ? ? ? ? PT Long Term Goals - 05/13/21 1429   ? ?  ? PT LONG TERM GOAL #1  ? Title Patient will increase FOTO score to equal to or greater than 73 to demonstrate statistically significant improvement in mobility and quality of life.   ? Baseline 05/13/21: 67   ? Time 8   ? Period Weeks   ? Status New   ? Target Date 07/08/21   ?  ? PT LONG TERM GOAL #2  ? Title Pt will decrease quick DASH  score by at least 8% in order to demonstrate clinically significant reduction in disability.   ? Baseline 05/13/21: 22.7%   ? Time 8   ? Period Weeks   ? Status New   ? Target Date 07/08/21   ?  ? PT LONG TERM GOAL #3  ? Title Pt will decrease  worst pain as reported on NPRS by at least 3 points in order to demonstrate clinically significant reduction in pain.   ? Baseline 05/13/21: 7/10 NPS   ? Time 8   ? Period Weeks   ? Status New   ? Target Date 07/08/21   ?  ? PT LONG TERM GOAL #4  ? Title Pt will increase strength of bilteral GH flexion, abduction, ER and extension by at least 1/2 MMT grade in order to demonstrate improvement in strength and function   ? Baseline 05/13/21: general 4 and 4- throughout (please refer to eval note)   ? Time 8   ? Period Weeks   ? Status New   ? Target Date 07/08/21   ?  ? PT LONG TERM GOAL #5  ? Title Pt will increase bilateral GH AROM flexion and abduction to at least 165d for functional use and overhead reaching activities.   ? Baseline 05/13/21: flexion R 160d, L 140d; abduction R 165, L 115   ? Time 8   ? Period Weeks   ? Status New   ? Target Date 07/08/21   ? ?  ?  ? ?  ? ? ? ? ? ? ? ? Plan - 05/24/21 1021   ? ? Clinical Impression Statement PT continued with progression of therex and manual techniques for increased mobility and periscapular strengthening. Pt with excellent mobility response to manual techniques, allowing for increased motor control for therex. Patinet able to comply with all cuing for proper technique of sequencing scapulo-thoracic complex for efficient movement to reduce bilat ant impingement. Patient reports no increased pain throughout session, with expected muscle fatigue. PT will continue therex progression as able.   ? Personal Factors and Comorbidities Age;Time since onset of injury/illness/exacerbation;Fitness   ? Examination-Activity Limitations Bathing;Reach Overhead;Sleep;Lift;Carry   ? Examination-Participation Restrictions Community Activity;Shop    ? Stability/Clinical Decision Making Evolving/Moderate complexity   ? Clinical Decision Making Moderate   ? Rehab Potential Good   ? PT Frequency 2x / week   ? PT Duration 8 weeks   ? PT Treatment/Interventions ADL

## 2021-05-26 ENCOUNTER — Ambulatory Visit: Payer: Commercial Managed Care - PPO | Admitting: Physical Therapy

## 2021-05-26 ENCOUNTER — Encounter: Payer: Self-pay | Admitting: Physical Therapy

## 2021-05-26 DIAGNOSIS — G8929 Other chronic pain: Secondary | ICD-10-CM

## 2021-05-26 DIAGNOSIS — M6281 Muscle weakness (generalized): Secondary | ICD-10-CM

## 2021-05-26 NOTE — Therapy (Signed)
[facility] ?[address] ?[place], [place], [address] ?Phone: [phone]   Fax:  [phone] ? ?Physical Therapy Treatment ? ?Patient Details  ?Name: Theresa Lester ?MRN: [id number] ?Date of Birth: [birth year] ?No data recorded ? ?Encounter Date: 05/26/2021 ? ? PT End of Session - 05/26/21 1008   ? ? Visit Number 5   ? Number of Visits 17   ? Date for PT Re-Evaluation 07/08/21   ? Authorization - Number of Visits 5   ? Progress Note Due on Visit 10   ? PT Start Time 1000   ? PT Stop Time 1856   ? PT Time Calculation (min) 40 min   ? Activity Tolerance Patient tolerated treatment well   ? Behavior During Therapy [facility] for tasks assessed/performed   ? ?  ?  ? ?  ? ? ?Past Medical History:  ?Diagnosis Date  ? Acoustic neuroma ([place])   ? Arthralgia of hip or thigh 10/19/2014  ? Blood glucose elevated 10/19/2014  ? A1C 5.7 [08/2013]   ? Calculus of gallbladder   ? Encounter for screening colonoscopy   ? Fast heart beat 10/19/2014  ? TFTs normal [02/2014]   ? GERD (gastroesophageal reflux disease)   ? Hemorrhoids, internal   ? Left axillary hidradenitis 11/05/2015  ? Restless leg   ? Tinnitus 11/25/2014  ? Tuberculosis   ? ? ?Past Surgical History:  ?Procedure Laterality Date  ? BILATERAL CARPAL TUNNEL RELEASE    ? COLONOSCOPY WITH PROPOFOL N/A 11/01/2018  ? Procedure: COLONOSCOPY WITH BIOPSY;  Surgeon: [name], MD;  Location: [facility];  Service: Endoscopy;  Laterality: N/A;  ? KNEE ARTHROSCOPY WITH MEDIAL MENISECTOMY Left 01/18/2017  ? Procedure: KNEE ARTHROSCOPY WITH MEDIAL MENISECTOMY ROOT REPAIR CHONDROPLASTY;  Surgeon: Leim Fabry, MD;  Location: [facility];  Service: Orthopedics;  Laterality: Left;  SUPINE WITH ACL LEG HOLDER ?[place], MENISCUS ROOT GUIDE ?STRYKER [place] CETERIX AIR  ?ANESTHESIA: ADDUCTOR CANAL NERVE BLOCK  ? KNEE ARTHROSCOPY WITH MEDIAL MENISECTOMY Right 07/16/2018  ? Procedure: KNEE ARTHROSCOPY  WITH MEDIAL MENISCUS  ROOT REPAIR;  Surgeon: Leim Fabry, MD;  Location: [facility];  Service: Orthopedics;  Laterality: Right;  MENSICUS ROOT GUIDE KIT + SWIVELOCKS,  SWIVELOCK TIBIAL FIXATION ?CETERIX ?OPEN KNEE TRAY ?BOVIE ELECTROCAUTERY  ? POLYPECTOMY N/A 11/01/2018  ? Procedure: POLYPECTOMY;  Surgeon: [name], MD;  Location: [facility];  Service: Endoscopy;  Laterality: N/A;  ? TUBAL LIGATION    ? VESTIBULAR NERVE SECTION Right 2017  ? ? ?There were no vitals filed for this visit. ? ? Subjective Assessment - 05/26/21 0959   ? ? Subjective Pt reports she felt good after last session and currently has no pain. Is completing HEP.   ? Pertinent History Pt arrives to PT reporting bilateral shoulder pain that has been present for at least a year, maybe two. Insidious onset occurred when pt was working at [employer] where she was required to lift large and sometimes heavy boxes. Pain began in right shoulder and eventually progressed to affect both shoulders. She no longer works at [employer] 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 Pain has decreased over time however continues to persist. Her MD prescribed Meloxicam which has eliminated the N&T that she used to experience.  She described right pain is typically worse than left. Pain is more proximal on left in the anterior shoulder; pain is proximal and distal into the elbow on the right side.   ? Limitations Lifting;House hold activities   ? How long can you sit comfortably? N/A   ? How long can you stand comfortably? N/A   ? How long can you walk comfortably? N/A   ? Diagnostic tests x-ray 01/25/21 - degenerative changes in shoulders and cervical spine   ? Patient Stated Goals to dececrease pain   ? Pain Onset More than a month ago   ? ?  ?  ? ?  ? ? ?INTERVENTIONS ?  ?-Arm ergometer level 3 for 5  minutes, changing direction midway. ?  ?Manual ~ 69mns ?Grade 2 post mobilization 3x 30sec each GHJ for pain reduction ?Grade 3 with flexion 236ms each with full PROM of R shoulder; approx 170d with L shoulder ?Cross friction massage to R prox bicep tendon ?  ?Dowel BUE shoulder flexion in supine with focus on scapulohumeral rhythm with good carry over ?Y on wall 2x 12 with decent carry over of demo and cuing for scapular retraction + depression  ?Thoracic ext over 1/2 foam with BUE flex with dowel 2x 12; cuing for synergistic scapulae, humeral, thoracic and cervical movement with good understanding ? - ER BUE RedTB, 2x 10 with min cuing to keep "elbows pinned to side" ?- post shoulder rolls x20  ? ? ? ? ? ? ? ? ? ? ? ? ? ? ? ? ? ? ? ? ? ? ? ? ? ? ? PT Education - 05/26/21 1007   ? ? Education Details therex form/technique   ? Person(s) Educated Patient   ? Methods Explanation;Demonstration;Verbal cues   ? Comprehension Verbalized understanding;Returned demonstration;Verbal cues required   ? ?  ?  ? ?  ? ? ? PT Short Term Goals - 05/13/21 1429   ? ?  ? PT SHORT TERM GOAL #1  ? Title Pt will be independent with HEP in order to improve strength and decrease pain in order to improve pain-free function at home and work.   ? Time 4   ? Period Weeks   ? Status New   ? Target Date 06/10/21   ? ?  ?  ? ?  ? ? ? ? PT Long Term Goals - 05/13/21 1429   ? ?  ? PT LONG TERM GOAL #1  ? Title Patient will increase FOTO score to equal to or greater than 73 to demonstrate statistically significant improvement in mobility and quality of life.   ? Baseline 05/13/21: 67   ? Time 8   ? Period Weeks   ? Status New   ? Target Date 07/08/21   ?  ? PT LONG TERM GOAL #2  ? Title Pt will decrease quick DASH score by at least 8% in order to demonstrate clinically significant reduction in disability.   ? Baseline 05/13/21: 22.7%   ? Time 8   ? Period Weeks   ? Status New   ? Target Date 07/08/21   ?  ? PT LONG TERM GOAL #3  ? Title Pt will  decrease worst pain as reported on NPRS by at least 3 points in order to demonstrate clinically significant reduction in pain.   ? Baseline 05/13/21: 7/10 NPS   ? Time 8   ? Period Weeks   ? Status New   ? Target Date 07/08/21   ?  ?  PT LONG TERM GOAL #4  ? Title Pt will increase strength of bilteral GH flexion, abduction, ER and extension by at least 1/2 MMT grade in order to demonstrate improvement in strength and function   ? Baseline 05/13/21: general 4 and 4- throughout (please refer to eval note)   ? Time 8   ? Period Weeks   ? Status New   ? Target Date 07/08/21   ?  ? PT LONG TERM GOAL #5  ? Title Pt will increase bilateral GH AROM flexion and abduction to at least 165d for functional use and overhead reaching activities.   ? Baseline 05/13/21: flexion R 160d, L 140d; abduction R 165, L 115   ? Time 8   ? Period Weeks   ? Status New   ? Target Date 07/08/21   ? ?  ?  ? ?  ? ? ? ? ? ? ? ? Plan - 05/26/21 1033   ? ? Clinical Impression Statement PT utilized manual therex with continued favorable response.Therex with focus on scapulohumeral rhythm with patient able to demosntrate understanding of this with demo and multimodal cuing. Patient with no pain following session. Patient reports some stretching discomfort with therex, but is very motivated througout session. PT will continue progression as able.   ? Personal Factors and Comorbidities Age;Time since onset of injury/illness/exacerbation;Fitness   ? Examination-Activity Limitations Bathing;Reach Overhead;Sleep;Lift;Carry   ? Examination-Participation Restrictions Community Activity;Shop   ? Stability/Clinical Decision Making Evolving/Moderate complexity   ? Clinical Decision Making Moderate   ? Rehab Potential Good   ? PT Frequency 2x / week   ? PT Duration 8 weeks   ? PT Treatment/Interventions ADLs/Self Care Home Management;Aquatic Therapy;Cryotherapy;Electrical Stimulation;Iontophoresis 35m/ml Dexamethasone;Moist Heat;Traction;Ultrasound;Functional  mobility training;Therapeutic activities;Therapeutic exercise;Neuromuscular re-education;Patient/family education;Orthotic Fit/Training;Manual techniques;Passive range of motion;Dry needling;Taping;Joint Manipulations

## 2021-05-31 ENCOUNTER — Ambulatory Visit: Payer: Commercial Managed Care - PPO | Admitting: Family Medicine

## 2021-06-02 ENCOUNTER — Encounter: Payer: Commercial Managed Care - PPO | Admitting: Physical Therapy

## 2021-06-09 ENCOUNTER — Encounter: Payer: Self-pay | Admitting: Physical Therapy

## 2021-06-09 ENCOUNTER — Ambulatory Visit: Payer: Commercial Managed Care - PPO

## 2021-06-09 DIAGNOSIS — G8929 Other chronic pain: Secondary | ICD-10-CM

## 2021-06-09 DIAGNOSIS — M6281 Muscle weakness (generalized): Secondary | ICD-10-CM | POA: Diagnosis not present

## 2021-06-09 NOTE — Therapy (Signed)
[facility] [address], [place], [address] Phone: [phone]   Fax:  [phone]  Physical Therapy Treatment  Patient Details  Name: Theresa Lester MRN: [id number] Date of Birth: December 26, 1970 No data recorded  Encounter Date: 06/09/2021   PT End of Session - 06/09/21 1006     Visit Number 6    Number of Visits 17    Date for PT Re-Evaluation 07/08/21    Authorization - Number of Visits 6    Progress Note Due on Visit 10    PT Start Time 1007    PT Stop Time 1045    PT Time Calculation (min) 38 min    Activity Tolerance Patient tolerated treatment well    Behavior During Therapy [facility] for tasks assessed/performed             Past Medical History:  Diagnosis Date   Acoustic neuroma ([place])    Arthralgia of hip or thigh 10/19/2014   Blood glucose elevated 10/19/2014   A1C 5.7 [08/2013]    Calculus of gallbladder    Encounter for screening colonoscopy    Fast heart beat 10/19/2014   TFTs normal [02/2014]    GERD (gastroesophageal reflux disease)    Hemorrhoids, internal    Left axillary hidradenitis 11/05/2015   Restless leg    Tinnitus 11/25/2014   Tuberculosis     Past Surgical History:  Procedure Laterality Date   BILATERAL CARPAL TUNNEL RELEASE     COLONOSCOPY WITH PROPOFOL N/A 11/01/2018   Procedure: COLONOSCOPY WITH BIOPSY;  Surgeon: [name], MD;  Location: [facility];  Service: Endoscopy;  Laterality: N/A;   KNEE ARTHROSCOPY WITH MEDIAL MENISECTOMY Left 01/18/2017   Procedure: KNEE ARTHROSCOPY WITH MEDIAL MENISECTOMY ROOT REPAIR CHONDROPLASTY;  Surgeon: Leim Fabry, MD;  Location: [facility];  Service: Orthopedics;  Laterality: Left;  SUPINE WITH ACL LEG HOLDER [place]TH[place] ALEX  FLIP CUTTER, MENISCUS ROOT GUIDE STRYKER [place] CETERIX AIR  ANESTHESIA: ADDUCTOR CANAL NERVE BLOCK   KNEE ARTHROSCOPY WITH MEDIAL MENISECTOMY Right 07/16/2018   Procedure: KNEE ARTHROSCOPY  WITH MEDIAL MENISCUS  ROOT REPAIR;  Surgeon: Leim Fabry, MD;  Location: [facility];  Service: Orthopedics;  Laterality: Right;  MENSICUS ROOT GUIDE KIT + SWIVELOCKS,  SWIVELOCK TIBIAL FIXATION CETERIX OPEN KNEE TRAY BOVIE ELECTROCAUTERY   POLYPECTOMY N/A 11/01/2018   Procedure: POLYPECTOMY;  Surgeon: [name], MD;  Location: [facility];  Service: Endoscopy;  Laterality: N/A;   TUBAL LIGATION     VESTIBULAR NERVE SECTION Right 2017    There were no vitals filed for this visit.   Subjective Assessment - 06/09/21 1008     Subjective Pt reports no pain in the last couple weeks since last sessionl Compliant with HEP.    Pertinent History Pt arrives to PT reporting bilateral shoulder pain that has been present for at least a year, maybe two. Insidious onset occurred when pt was working at [employer] where she was required to lift large and sometimes heavy boxes. Pain began in right shoulder and eventually progressed to affect both shoulders. She no longer works at [employer] 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 Pain has decreased over time however continues to persist. Her MD prescribed Meloxicam which has eliminated the N&T that she used to experience. She  described right pain is typically worse than left. Pain is more proximal on left in the anterior shoulder; pain is proximal and distal into the elbow on the right side.    Limitations Lifting;House hold activities    How long can you sit comfortably? N/A    How long can you stand comfortably? N/A    How long can you walk comfortably? N/A    Diagnostic tests x-ray 01/25/21 - degenerative changes in shoulders and cervical spine    Patient Stated Goals to dececrease pain    Currently in Pain? No/denies    Pain Onset More than a month ago            There.ex:    Arm  ergometer level 3 for 5 minutes, changing direction midway.   Dowel BUE shoulder flexion in seated with focus on scapulohumeral rhythm with good carry over. 1x15 no resistance, 1x15 with 3# AW. VC's for neutral cervical positioning.   Y on wall 2x 12. Good form/technique with scapular depression and retractions   GTB standing BUE shoulder ER: 2x12, min VC's for form/technique.    Lat pulldown at [employer]:    1x15/ 20 #, good understanding of form/technique with exercise after demo   2x8, 35# with maintained good form   Manual Therapy: 74mns pt in supine Grade 3 PA mobilization 3x 30sec each GHJ for improved overhead mobility Grade 3 inferior mobs with flexion of ~170 degs/shoulder to improve flexion mobility, 2x15 sec bouts    PT Education - 06/09/21 1009     Education Details form/technique with exercise    Person(s) Educated Patient    Methods Explanation;Demonstration;Tactile cues;Verbal cues    Comprehension Verbalized understanding;Returned demonstration              PT Short Term Goals - 05/13/21 1429       PT SHORT TERM GOAL #1   Title Pt will be independent with HEP in order to improve strength and decrease pain in order to improve pain-free function at home and work.    Time 4    Period Weeks    Status New    Target Date 06/10/21               PT Long Term Goals - 05/13/21 1429       PT LONG TERM GOAL #1   Title Patient will increase FOTO score to equal to or greater than 73 to demonstrate statistically significant improvement in mobility and quality of life.    Baseline 05/13/21: 67    Time 8    Period Weeks    Status New    Target Date 07/08/21      PT LONG TERM GOAL #2   Title Pt will decrease quick DASH score by at least 8% in order to demonstrate clinically significant reduction in disability.    Baseline 05/13/21: 22.7%    Time 8    Period Weeks    Status New    Target Date 07/08/21      PT LONG TERM GOAL #3   Title Pt will decrease worst  pain as reported on NPRS by at least 3 points in order to demonstrate clinically significant reduction in pain.    Baseline 05/13/21: 7/10 NPS    Time 8    Period Weeks    Status New    Target Date 07/08/21      PT LONG TERM GOAL #4   Title Pt will increase strength of bilteral GH flexion,  abduction, ER and extension by at least 1/2 MMT grade in order to demonstrate improvement in strength and function    Baseline 05/13/21: general 4 and 4- throughout (please refer to eval note)    Time 8    Period Weeks    Status New    Target Date 07/08/21      PT LONG TERM GOAL #5   Title Pt will increase bilateral GH AROM flexion and abduction to at least 165d for functional use and overhead reaching activities.    Baseline 05/13/21: flexion R 160d, L 140d; abduction R 165, L 115    Time 8    Period Weeks    Status New    Target Date 07/08/21                   Plan - 06/09/21 1058     Clinical Impression Statement Pt pleasant and eager to participate. Overall session slightly limited due to pt being late. Overall pt tolerating new exercises and progression of periscapular strengthening without exacerbation of pain. Pt progressing in PT with education on current length of POC as pt has follow up appointment with orthopedic next week. Pt encouraged to maintain POC prior to d/c'ing early to encourage and optimize recovery. Pt verbalizing understanding. PT to continue POC to progress strength and mobility to return to PLOF.    Personal Factors and Comorbidities Age;Time since onset of injury/illness/exacerbation;Fitness    Examination-Activity Limitations Bathing;Reach Overhead;Sleep;Lift;Carry    Statistician;Shop    Stability/Clinical Decision Making Evolving/Moderate complexity    Rehab Potential Good    PT Frequency 2x / week    PT Duration 8 weeks    PT Treatment/Interventions ADLs/Self Care Home Management;Aquatic  Therapy;Cryotherapy;Electrical Stimulation;Iontophoresis 84m/ml Dexamethasone;Moist Heat;Traction;Ultrasound;Functional mobility training;Therapeutic activities;Therapeutic exercise;Neuromuscular re-education;Patient/family education;Orthotic Fit/Training;Manual techniques;Passive range of motion;Dry needling;Taping;Joint Manipulations;Spinal Manipulations    PT Next Visit Plan continue BUE strengthening, flexibility, [place]    PT Home Exercise Plan Access Code: [email or web address]   Consulted and Agree with Plan of Care Patient             Patient will benefit from skilled therapeutic intervention in order to improve the following deficits and impairments:  Decreased strength, Impaired UE functional use, Decreased range of motion  Visit Diagnosis: Muscle weakness (generalized)  Chronic left shoulder pain  Chronic right shoulder pain     Problem List Patient Active Problem List   Diagnosis Date Noted   Biceps tendinitis of both shoulders 03/29/2021   Rotator cuff impingement syndrome, right 01/25/2021   Cervical paraspinal muscle spasm 01/25/2021   Lateral epicondylitis, left elbow 04/13/2020   Tricompartment osteoarthritis of right knee 04/13/2020   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 02/03/2020   Mild persistent asthma without complication 017/91/5056  Benign neoplasm of cecum    Immunization, BCG 03/01/2018   Tricompartment osteoarthritis of left knee 10/11/2016   Hemifacial spasm    TIA (transient ischemic attack) 06/11/2015   Episodic tension-type headache, not intractable 04/14/2015   Angioneurotic edema 10/19/2014   Calculus of gallbladder 10/19/2014   Acid reflux 10/19/2014   Hemorrhoids, internal 10/19/2014   Restless leg syndrome 10/19/2014   MSalem Caster Fairly IV, PT, DPT Physical Therapist- [facility] 06/09/2021, 11:50 AM  [facility] [address] [place]  [id number]: [phone]  Fax:  [phone] Name: Theresa BombaMRN: [id number] of Birth: 909-13-1972

## 2021-06-14 ENCOUNTER — Encounter: Payer: Self-pay | Admitting: Physical Therapy

## 2021-06-14 ENCOUNTER — Ambulatory Visit: Payer: Commercial Managed Care - PPO | Admitting: Physical Therapy

## 2021-06-14 DIAGNOSIS — M25512 Pain in left shoulder: Secondary | ICD-10-CM

## 2021-06-14 DIAGNOSIS — M6281 Muscle weakness (generalized): Secondary | ICD-10-CM | POA: Diagnosis not present

## 2021-06-14 DIAGNOSIS — G8929 Other chronic pain: Secondary | ICD-10-CM

## 2021-06-14 NOTE — Therapy (Signed)
[facility] [address], [place], [address] Phone: [phone]   Fax:  [phone]  Physical Therapy Treatment/DC Summary Reporting Period 05/13/21 - 06/14/21  Patient Details  Name: Theresa Lester MRN: [id number] Date of Birth: 05-25-1970 No data recorded  Encounter Date: 06/14/2021   PT End of Session - 06/14/21 6811     Visit Number 7    Number of Visits 17    Date for PT Re-Evaluation 07/08/21    Authorization - Number of Visits 7    Progress Note Due on Visit 10    PT Start Time 0915    PT Stop Time 1000    PT Time Calculation (min) 45 min    Activity Tolerance Patient tolerated treatment well    Behavior During Therapy [facility] for tasks assessed/performed             Past Medical History:  Diagnosis Date   Acoustic neuroma ([address])    Arthralgia of hip or thigh 10/19/2014   Blood glucose elevated 10/19/2014   A1C 5.7 [08/2013]    Calculus of gallbladder    Encounter for screening colonoscopy    Fast heart beat 10/19/2014   TFTs normal [02/2014]    GERD (gastroesophageal reflux disease)    Hemorrhoids, internal    Left axillary hidradenitis 11/05/2015   Restless leg    Tinnitus 11/25/2014   Tuberculosis     Past Surgical History:  Procedure Laterality Date   BILATERAL CARPAL TUNNEL RELEASE     COLONOSCOPY WITH PROPOFOL N/A 11/01/2018   Procedure: COLONOSCOPY WITH BIOPSY;  Surgeon: [name], MD;  Location: [place];  Service: Endoscopy;  Laterality: N/A;   KNEE ARTHROSCOPY WITH MEDIAL MENISECTOMY Left 01/18/2017   Procedure: KNEE ARTHROSCOPY WITH MEDIAL MENISECTOMY ROOT REPAIR CHONDROPLASTY;  Surgeon: Leim Fabry, MD;  Location: [place];  Service: Orthopedics;  Laterality: Left;  SUPINE WITH ACL LEG HOLDER ARTHREX ALEX  FLIP CUTTER, MENISCUS ROOT GUIDE STRYKER [place] CETERIX AIR  ANESTHESIA: ADDUCTOR CANAL NERVE BLOCK   KNEE ARTHROSCOPY WITH MEDIAL MENISECTOMY  Right 07/16/2018   Procedure: KNEE ARTHROSCOPY WITH MEDIAL MENISCUS  ROOT REPAIR;  Surgeon: Leim Fabry, MD;  Location: [place];  Service: Orthopedics;  Laterality: Right;  MENSICUS ROOT GUIDE KIT + SWIVELOCKS,  SWIVELOCK TIBIAL FIXATION CETERIX OPEN KNEE TRAY BOVIE ELECTROCAUTERY   POLYPECTOMY N/A 11/01/2018   Procedure: POLYPECTOMY;  Surgeon: [name], MD;  Location: [place];  Service: Endoscopy;  Laterality: N/A;   TUBAL LIGATION     VESTIBULAR NERVE SECTION Right 2017    There were no vitals filed for this visit.   Subjective Assessment - 06/14/21 0921     Subjective Pt reports more L shoulder> R shoulder pain. Reports she has been having L elbow pain as well, but cannot pinpoint exact event that was causing.    Pertinent History Pt arrives to PT reporting bilateral shoulder pain that has been present for at least a year, maybe two. Insidious onset occurred when pt was working at [employer] where she was required to lift large and sometimes heavy boxes. Pain began in right shoulder and eventually progressed to affect both shoulders. She no longer works at [employer] 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 Pain has decreased over time however continues  to persist. Her MD prescribed Meloxicam which has eliminated the N&T that she used to experience. She described right pain is typically worse than left. Pain is more proximal on left in the anterior shoulder; pain is proximal and distal into the elbow on the right side.    Limitations Lifting;House hold activities    How long can you sit comfortably? N/A    How long can you stand comfortably? N/A    How long can you walk comfortably? N/A    Diagnostic tests x-ray 01/25/21 - degenerative changes in shoulders and cervical spine    Patient Stated Goals to dececrease pain     Pain Onset More than a month ago               There.ex:  Ergometer 36mns FWD and BWD L3 PT reviewed the following HEP with patient with patient able to demonstrate a set of the following with min cuing for correction needed. PT educated patient on parameters of therex (how/when to inc/decrease intensity, frequency, rep/set range, stretch hold time, and purpose of therex) with verbalized understanding.  - Doorway Pec Stretch at 90 Degrees Abduction  - 1-2 x daily - 7 x weekly - 60 hold - Seated Thoracic Lumbar Extension with Pectoralis Stretch  - 1-2 x daily - 7 x weekly - 12-20 reps - Shoulder External Rotation and Scapular Retraction with Resistance  - 1 x daily - 2 x weekly - 3 sets - 6-10 reps - Shoulder extension with resistance - Neutral  - 1 x daily - 2 x weekly - 3 sets - 6-10 reps - Low Trap Setting at [place] - 1 x daily - 2 x weekly - 3 sets - 6-10 reps                           PT Education - 06/14/21 0929     Education Details DC HEP    Person(s) Educated Patient    Methods Explanation;Demonstration;Verbal cues    Comprehension Verbalized understanding;Returned demonstration;Verbal cues required              PT Short Term Goals - 05/13/21 1429       PT SHORT TERM GOAL #1   Title Pt will be independent with HEP in order to improve strength and decrease pain in order to improve pain-free function at home and work.    Time 4    Period Weeks    Status New    Target Date 06/10/21               PT Long Term Goals - 06/14/21 0924       PT LONG TERM GOAL #1   Title Patient will increase FOTO score to equal to or greater than 73 to demonstrate statistically significant improvement in mobility and quality of life.    Baseline 05/13/21: 67; 06/14/21 71    Time 8    Period Weeks    Status Partially Met      PT LONG TERM GOAL #2   Title Pt will decrease quick DASH score by at least 8% in order to demonstrate clinically significant  reduction in disability.    Baseline 05/13/21: 22.7%    Time 8    Period Weeks    Status Deferred      PT LONG TERM GOAL #3   Title Pt will decrease worst pain as reported on NPRS by at least 3 points in order  to demonstrate clinically significant reduction in pain.    Baseline 05/13/21: 7/10 NPS; 06/14/21 1x 6/10 soley L shoulder    Time 8    Period Weeks    Status Partially Met      PT LONG TERM GOAL #4   Title Pt will increase strength of bilteral GH flexion, abduction, ER and extension by at least 1/2 MMT grade in order to demonstrate improvement in strength and function    Baseline 05/13/21: general 4 and 4- throughout (please refer to eval note); 06/14/21 gross 5/5 strength    Time 8    Period Weeks    Status Achieved      PT LONG TERM GOAL #5   Title Pt will increase bilateral GH AROM flexion and abduction to at least 165d for functional use and overhead reaching activities.    Baseline 05/13/21: flexion R 160d, L 140d; abduction R 165, L 115; 06/14/21 Full AROM bilat with min pain at end range flex/abd with L shoulder    Time 8    Period Weeks    Status Achieved                   Plan - 06/14/21 1230     Clinical Impression Statement Pt has met strength and ROM goals, with progress toward pain and FOTO goal. Patient demonstrating excellent independence of theres for mobility/strengthening, desiring to d/c PT this session to robust HEP. Patient is able to demonstrate and verbalize understanding of HEP with handout given for maintenance. Pt given clinic contact info should questions or concerns arise. Pt to d/c PT.    Personal Factors and Comorbidities Age;Time since onset of injury/illness/exacerbation;Fitness    Examination-Activity Limitations Bathing;Reach Overhead;Sleep;Lift;Carry    Statistician;Shop    Stability/Clinical Decision Making Evolving/Moderate complexity    Clinical Decision Making Moderate    Rehab Potential  Good    PT Frequency 2x / week    PT Duration 8 weeks    PT Treatment/Interventions ADLs/Self Care Home Management;Aquatic Therapy;Cryotherapy;Electrical Stimulation;Iontophoresis 63m/ml Dexamethasone;Moist Heat;Traction;Ultrasound;Functional mobility training;Therapeutic activities;Therapeutic exercise;Neuromuscular re-education;Patient/family education;Orthotic Fit/Training;Manual techniques;Passive range of motion;Dry needling;Taping;Joint Manipulations;Spinal Manipulations    PT Next Visit Plan continue BUE strengthening, flexibility, [place]    PT Home Exercise Plan Access Code: [email or web address]   Consulted and Agree with Plan of Care Patient             Patient will benefit from skilled therapeutic intervention in order to improve the following deficits and impairments:  Decreased strength, Impaired UE functional use, Decreased range of motion  Visit Diagnosis: Muscle weakness (generalized)  Chronic left shoulder pain  Chronic right shoulder pain     Problem List Patient Active Problem List   Diagnosis Date Noted   Biceps tendinitis of both shoulders 03/29/2021   Rotator cuff impingement syndrome, right 01/25/2021   Cervical paraspinal muscle spasm 01/25/2021   Lateral epicondylitis, left elbow 04/13/2020   Tricompartment osteoarthritis of right knee 04/13/2020   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 02/03/2020   Mild persistent asthma without complication 017/51/0258  Benign neoplasm of cecum    Immunization, BCG 03/01/2018   Tricompartment osteoarthritis of left knee 10/11/2016   Hemifacial spasm    TIA (transient ischemic attack) 06/11/2015   Episodic tension-type headache, not intractable 04/14/2015   Angioneurotic edema 10/19/2014   Calculus of gallbladder 10/19/2014   Acid reflux 10/19/2014   Hemorrhoids, internal 10/19/2014   Restless leg syndrome 10/19/2014    CDurwin Reges PT 06/14/2021, 12:33 PM  [facility] [address] [place], [address] Phone: [phone]   Fax:  [phone]  Name: Theresa Lester MRN: [id number] Date of Birth: 12-19-1970

## 2021-06-16 ENCOUNTER — Ambulatory Visit: Payer: Commercial Managed Care - PPO | Admitting: Family Medicine

## 2021-07-14 ENCOUNTER — Ambulatory Visit: Payer: Commercial Managed Care - PPO | Admitting: Family Medicine

## 2021-07-22 ENCOUNTER — Ambulatory Visit: Payer: Commercial Managed Care - PPO | Admitting: Family Medicine

## 2021-07-22 ENCOUNTER — Encounter: Payer: Self-pay | Admitting: Family Medicine

## 2021-07-22 VITALS — BP 128/78 | HR 80 | Ht 63.0 in | Wt 201.0 lb

## 2021-07-22 DIAGNOSIS — M7521 Bicipital tendinitis, right shoulder: Secondary | ICD-10-CM | POA: Diagnosis not present

## 2021-07-22 DIAGNOSIS — M7522 Bicipital tendinitis, left shoulder: Secondary | ICD-10-CM | POA: Diagnosis not present

## 2021-07-22 DIAGNOSIS — M7541 Impingement syndrome of right shoulder: Secondary | ICD-10-CM

## 2021-07-22 MED ORDER — MELOXICAM 15 MG PO TABS: 15.0000 mg | ORAL_TABLET | Freq: Every day | ORAL | 0 refills | Status: DC | PRN

## 2021-07-22 NOTE — Patient Instructions (Signed)
-   Continue home exercises on a regular basis to keep symptoms from recurring - Dose meloxicam once daily on an as-needed basis - Can return to PT if symptoms begin to recur - If persistently symptomatic, follow-up with our office

## 2021-07-22 NOTE — Progress Notes (Signed)
     Primary Care / Sports Medicine Office Visit  Patient Information:  Patient ID: Theresa Lester, female DOB: [birth year] Age: 51 y.o. MRN: [id number]   Theresa Lester is a pleasant 51 y.o. female presenting with the following:  Chief Complaint  Patient presents with   Biceps tendinitis of both shoulders    States PT did go well, does stretches at home, does get pain from time to time.     Vitals:   07/22/21 0806  BP: 128/78  Pulse: 80  SpO2: 98%   Vitals:   07/22/21 0806  Weight: 201 lb (91.2 kg)  Height: '5\' 3"'$  (1.6 m)   Body mass index is 35.61 kg/m.  No results found.   Independent interpretation of notes and tests performed by another provider:   None  Procedures performed:   None  Pertinent History, Exam, Impression, and Recommendations:   Problem List Items Addressed This Visit       Musculoskeletal and Integument   Rotator cuff impingement syndrome, right    See additional assessment(s) for plan details.      Relevant Medications   meloxicam (MOBIC) 15 MG tablet   Biceps tendinitis of both shoulders - Primary    Patient presents for follow-up to bilateral biceps tendinitis and right rotator cuff impingement syndrome.  Since interval visit she has been attending regular physical therapy sessions, every other day home exercises, and requires meloxicam only sporadically (once every few weeks) with report of essential symptom resolution.  Her examination today in that regard is completely benign at bilateral shoulders with only mild discomfort at the distal biceps tendon on the right.  At this stage have advised her to continue maintenance home-based rehabilitation exercises for the shoulders, utilize meloxicam sporadically, new Rx provided, and follow-up on as-needed basis.        Orders & Medications Meds ordered this encounter  Medications   meloxicam (MOBIC) 15 MG tablet    Sig: Take 1 tablet (15 mg total) by mouth daily as needed  for pain.    Dispense:  90 tablet    Refill:  0   No orders of the defined types were placed in this encounter.    Return if symptoms worsen or fail to improve.     Montel Culver, MD   Primary Care Sports Medicine [place]

## 2021-07-22 NOTE — Assessment & Plan Note (Signed)
Patient presents for follow-up to bilateral biceps tendinitis and right rotator cuff impingement syndrome.  Since interval visit she has been attending regular physical therapy sessions, every other day home exercises, and requires meloxicam only sporadically (once every few weeks) with report of essential symptom resolution.  Her examination today in that regard is completely benign at bilateral shoulders with only mild discomfort at the distal biceps tendon on the right.  At this stage have advised her to continue maintenance home-based rehabilitation exercises for the shoulders, utilize meloxicam sporadically, new Rx provided, and follow-up on as-needed basis.

## 2021-07-22 NOTE — Assessment & Plan Note (Signed)
See additional assessment(s) for plan details. 

## 2021-08-12 ENCOUNTER — Other Ambulatory Visit: Payer: Self-pay | Admitting: Internal Medicine

## 2021-08-12 DIAGNOSIS — G2581 Restless legs syndrome: Secondary | ICD-10-CM

## 2021-08-14 MED ORDER — GABAPENTIN 100 MG PO CAPS: 100.0000 mg | ORAL_CAPSULE | Freq: Every evening | ORAL | 0 refills | Status: DC

## 2021-10-10 ENCOUNTER — Ambulatory Visit (INDEPENDENT_AMBULATORY_CARE_PROVIDER_SITE_OTHER): Payer: Commercial Managed Care - PPO | Admitting: Internal Medicine

## 2021-10-10 ENCOUNTER — Encounter: Payer: Self-pay | Admitting: Internal Medicine

## 2021-10-10 VITALS — BP 130/86 | HR 91 | Temp 98.1°F | Ht 63.0 in | Wt 197.0 lb

## 2021-10-10 DIAGNOSIS — R051 Acute cough: Secondary | ICD-10-CM

## 2021-10-10 DIAGNOSIS — J0100 Acute maxillary sinusitis, unspecified: Secondary | ICD-10-CM | POA: Diagnosis not present

## 2021-10-10 LAB — POC COVID19 BINAXNOW: SARS Coronavirus 2 Ag: NEGATIVE

## 2021-10-10 MED ORDER — AMOXICILLIN-POT CLAVULANATE 875-125 MG PO TABS: 1.0000 | ORAL_TABLET | Freq: Two times a day (BID) | ORAL | 0 refills | Status: AC

## 2021-10-10 MED ORDER — PROMETHAZINE-DM 6.25-15 MG/5ML PO SYRP: 5.0000 mL | ORAL_SOLUTION | Freq: Four times a day (QID) | ORAL | 0 refills | Status: AC | PRN

## 2021-10-10 NOTE — Progress Notes (Signed)
Date:  10/10/2021   Name:  Theresa Lester   DOB:  12-16-1970   MRN:  [id number]   Chief Complaint: Cough  Cough This is a new problem. Episode onset: 3 days. The problem has been waxing and waning. The cough is Productive of sputum. Associated symptoms include headaches, myalgias, nasal congestion, postnasal drip, a sore throat and shortness of breath. Pertinent negatives include no ear pain or fever.    Lab Results  Component Value Date   NA 139 02/24/2021   K 4.5 02/24/2021   CO2 25 02/24/2021   GLUCOSE 90 02/24/2021   BUN 17 02/24/2021   CREATININE 0.69 02/24/2021   CALCIUM 9.4 02/24/2021   EGFR 104 02/24/2021   GFRNONAA >60 01/19/2020   Lab Results  Component Value Date   CHOL 156 02/24/2021   HDL 53 02/24/2021   LDLCALC 82 02/24/2021   TRIG 115 02/24/2021   CHOLHDL 2.9 02/24/2021   Lab Results  Component Value Date   TSH 2.890 02/24/2021   Lab Results  Component Value Date   HGBA1C 5.9 (H) 02/24/2021   Lab Results  Component Value Date   WBC 7.7 02/24/2021   HGB 14.6 02/24/2021   HCT 44.7 02/24/2021   MCV 85 02/24/2021   PLT 277 02/24/2021   Lab Results  Component Value Date   ALT 23 02/24/2021   AST 19 02/24/2021   ALKPHOS 90 02/24/2021   BILITOT 0.2 02/24/2021   No results found for: "25OHVITD2", "25OHVITD3", "VD25OH"   Review of Systems  Constitutional:  Negative for fever.  HENT:  Positive for postnasal drip and sore throat. Negative for ear pain.   Respiratory:  Positive for cough and shortness of breath.   Musculoskeletal:  Positive for myalgias.  Neurological:  Positive for headaches.    Patient Active Problem List   Diagnosis Date Noted   Biceps tendinitis of both shoulders 03/29/2021   Rotator cuff impingement syndrome, right 01/25/2021   Cervical paraspinal muscle spasm 01/25/2021   Lateral epicondylitis, left elbow 04/13/2020   Tricompartment osteoarthritis of right knee 04/13/2020   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  02/03/2020   Mild persistent asthma without complication 35/36/1443   Benign neoplasm of cecum    Immunization, BCG 03/01/2018   Tricompartment osteoarthritis of left knee 10/11/2016   Hemifacial spasm    TIA (transient ischemic attack) 06/11/2015   Episodic tension-type headache, not intractable 04/14/2015   Angioneurotic edema 10/19/2014   Calculus of gallbladder 10/19/2014   Acid reflux 10/19/2014   Hemorrhoids, internal 10/19/2014   Restless leg syndrome 10/19/2014    No Known Allergies  Past Surgical History:  Procedure Laterality Date   BILATERAL CARPAL TUNNEL RELEASE     COLONOSCOPY WITH PROPOFOL N/A 11/01/2018   Procedure: COLONOSCOPY WITH BIOPSY;  Surgeon: [name], MD;  Location: [place];  Service: Endoscopy;  Laterality: N/A;   KNEE ARTHROSCOPY WITH MEDIAL MENISECTOMY Left 01/18/2017   Procedure: KNEE ARTHROSCOPY WITH MEDIAL MENISECTOMY ROOT REPAIR CHONDROPLASTY;  Surgeon: Leim Fabry, MD;  Location: [place];  Service: Orthopedics;  Laterality: Left;  SUPINE WITH ACL LEG HOLDER ARTHREX ALEX  FLIP CUTTER, MENISCUS ROOT GUIDE STRYKER [place] CETERIX AIR  ANESTHESIA: ADDUCTOR CANAL NERVE BLOCK   KNEE ARTHROSCOPY WITH MEDIAL MENISECTOMY Right 07/16/2018   Procedure: KNEE ARTHROSCOPY WITH MEDIAL MENISCUS  ROOT REPAIR;  Surgeon: Leim Fabry, MD;  Location: [place];  Service: Orthopedics;  Laterality: Right;  MENSICUS ROOT GUIDE KIT + SWIVELOCKS,  SWIVELOCK TIBIAL FIXATION CETERIX  OPEN KNEE TRAY BOVIE ELECTROCAUTERY   POLYPECTOMY N/A 11/01/2018   Procedure: POLYPECTOMY;  Surgeon: [name], MD;  Location: [place];  Service: Endoscopy;  Laterality: N/A;   TUBAL LIGATION     VESTIBULAR NERVE SECTION Right 2017    Social History   Tobacco Use   Smoking status: Never   Smokeless tobacco: Never  Vaping Use   Vaping Use: Never used  Substance Use Topics   Alcohol use: Not Currently    Alcohol/week: 0.0 standard drinks of  alcohol   Drug use: Never     Medication list has been reviewed and updated.  Current Meds  Medication Sig   albuterol (VENTOLIN HFA) 108 (90 Base) MCG/ACT inhaler Inhale 2 puffs into the lungs every 6 (six) hours as needed for wheezing or shortness of breath.   Budesonide (PULMICORT FLEXHALER) 90 MCG/ACT inhaler Inhale 1 puff into the lungs 2 (two) times daily.   carbamazepine (TEGRETOL XR) 100 MG 12 hr tablet Take 100 mg by mouth daily as needed.   cyclobenzaprine (FLEXERIL) 10 MG tablet Take 1 tablet (10 mg total) by mouth 2 (two) times daily as needed for muscle spasms.   gabapentin (NEURONTIN) 100 MG capsule Take 1 capsule (100 mg total) by mouth every evening.   hydrocortisone (ANUSOL-HC) 2.5 % rectal cream Place 1 application rectally 2 (two) times daily.   meloxicam (MOBIC) 15 MG tablet Take 1 tablet (15 mg total) by mouth daily as needed for pain.   triamcinolone cream (KENALOG) 0.1 % Apply 1 application topically 2 (two) times daily. To rash on feet       10/10/2021   10:31 AM 07/22/2021    8:08 AM 03/29/2021   10:07 AM 02/24/2021   10:01 AM  GAD 7 : Generalized Anxiety Score  Nervous, Anxious, on Edge 0 0 0 0  Control/stop worrying 0 0 0 0  Worry too much - different things 0 0 0 0  Trouble relaxing 0 0 0 0  Restless 0 0 0 0  Easily annoyed or irritable 0 0 0 0  Afraid - awful might happen 0 0 0 0  Total GAD 7 Score 0 0 0 0  Anxiety Difficulty Not difficult at all Not difficult at all  Not difficult at all       10/10/2021   10:31 AM 07/22/2021    8:08 AM 03/29/2021   10:07 AM  Depression screen PHQ 2/9  Decreased Interest 0 0 0  Down, Depressed, Hopeless 0 0 0  PHQ - 2 Score 0 0 0  Altered sleeping 0 0 0  Tired, decreased energy 0 0 0  Change in appetite 0 0 0  Feeling bad or failure about yourself  0 0 0  Trouble concentrating 0 0 0  Moving slowly or fidgety/restless 0 0 0  Suicidal thoughts 0 0 0  PHQ-9 Score 0 0 0  Difficult doing work/chores Not difficult  at all Not difficult at all     BP Readings from Last 3 Encounters:  10/10/21 130/86  07/22/21 128/78  03/29/21 136/78    Physical Exam Constitutional:      Appearance: Normal appearance.  HENT:     Right Ear: Tympanic membrane and ear canal normal.     Left Ear: Tympanic membrane and ear canal normal.     Nose:     Right Sinus: Maxillary sinus tenderness present. No frontal sinus tenderness.     Left Sinus: Maxillary sinus tenderness present. No frontal sinus  tenderness.     Mouth/Throat:     Pharynx: Posterior oropharyngeal erythema present. No oropharyngeal exudate.  Cardiovascular:     Rate and Rhythm: Normal rate and regular rhythm.     Pulses: Normal pulses.  Pulmonary:     Effort: Pulmonary effort is normal.     Breath sounds: Normal breath sounds. No wheezing or rhonchi.  Musculoskeletal:     Cervical back: Normal range of motion.  Lymphadenopathy:     Cervical: No cervical adenopathy.  Neurological:     Mental Status: She is alert.     Wt Readings from Last 3 Encounters:  10/10/21 197 lb (89.4 kg)  07/22/21 201 lb (91.2 kg)  03/29/21 202 lb 9.6 oz (91.9 kg)    BP 130/86   Pulse 91   Temp 98.1 F (36.7 C) (Oral)   Ht _0  (1.6 m)   Wt 197 lb (89.4 kg)   SpO2 96%   BMI 34.90 kg/m   Assessment and Plan: 1. Acute non-recurrent maxillary sinusitis Recommend OTC Flonase spray Continue Pulmicort - amoxicillin-clavulanate (AUGMENTIN) 875-125 MG tablet; Take 1 tablet by mouth 2 (two) times daily for 10 days.  Dispense: 20 tablet; Refill: 0 - promethazine-dextromethorphan (PROMETHAZINE-DM) 6.25-15 MG/5ML syrup; Take 5 mLs by mouth 4 (four) times daily as needed for up to 9 days for cough.  Dispense: 118 mL; Refill: 0  2. Acute cough Covid negative Note for work - POC COVID-19 BinaxNow - promethazine-dextromethorphan (PROMETHAZINE-DM) 6.25-15 MG/5ML syrup; Take 5 mLs by mouth 4 (four) times daily as needed for up to 9 days for cough.  Dispense: 118 mL;  Refill: 0   Partially dictated using Editor, commissioning. Any errors are unintentional.  Halina Maidens, MD [facility]  10/10/2021

## 2021-10-10 NOTE — Patient Instructions (Signed)
Flonase Nasal spray - over the counter - use it daily.

## 2021-10-26 ENCOUNTER — Ambulatory Visit (INDEPENDENT_AMBULATORY_CARE_PROVIDER_SITE_OTHER): Payer: Commercial Managed Care - PPO

## 2021-10-26 DIAGNOSIS — Z23 Encounter for immunization: Secondary | ICD-10-CM

## 2021-11-23 ENCOUNTER — Ambulatory Visit (INDEPENDENT_AMBULATORY_CARE_PROVIDER_SITE_OTHER): Payer: Commercial Managed Care - PPO | Admitting: Internal Medicine

## 2021-11-23 ENCOUNTER — Encounter: Payer: Self-pay | Admitting: Internal Medicine

## 2021-11-23 VITALS — BP 128/74 | HR 84 | Temp 98.4°F | Ht 63.0 in | Wt 198.0 lb

## 2021-11-23 DIAGNOSIS — R1319 Other dysphagia: Secondary | ICD-10-CM | POA: Diagnosis not present

## 2021-11-23 DIAGNOSIS — K219 Gastro-esophageal reflux disease without esophagitis: Secondary | ICD-10-CM

## 2021-11-23 MED ORDER — OMEPRAZOLE 40 MG PO CPDR: 40.0000 mg | DELAYED_RELEASE_CAPSULE | Freq: Every day | ORAL | 0 refills | Status: DC

## 2021-11-23 NOTE — Progress Notes (Signed)
Date:  11/23/2021   Name:  Theresa Lester   DOB:  1970/08/14   MRN:  [id number]   Chief Complaint: Sore Throat (Swollen )  Sore Throat  This is a new problem. Episode onset: X1 month. The problem has been gradually improving. Sore throat worse side: both sides hurt. There has been no fever. The pain is at a severity of 0/10. The patient is experiencing no pain. Associated symptoms include abdominal pain (intermittent GERD), coughing, swollen glands and trouble swallowing. Pertinent negatives include no diarrhea or vomiting. Associated symptoms comments: Difficultly swallowing . Treatments tried: cough medication. The treatment provided mild relief.  Gastroesophageal Reflux She complains of abdominal pain (intermittent GERD), coughing, dysphagia, globus sensation and heartburn. She reports no nausea or no wheezing. This is a new problem. The current episode started more than 1 month ago. The problem occurs frequently. The problem has been unchanged. Pertinent negatives include no fatigue.    Lab Results  Component Value Date   NA 139 02/24/2021   K 4.5 02/24/2021   CO2 25 02/24/2021   GLUCOSE 90 02/24/2021   BUN 17 02/24/2021   CREATININE 0.69 02/24/2021   CALCIUM 9.4 02/24/2021   EGFR 104 02/24/2021   GFRNONAA >60 01/19/2020   Lab Results  Component Value Date   CHOL 156 02/24/2021   HDL 53 02/24/2021   LDLCALC 82 02/24/2021   TRIG 115 02/24/2021   CHOLHDL 2.9 02/24/2021   Lab Results  Component Value Date   TSH 2.890 02/24/2021   Lab Results  Component Value Date   HGBA1C 5.9 (H) 02/24/2021   Lab Results  Component Value Date   WBC 7.7 02/24/2021   HGB 14.6 02/24/2021   HCT 44.7 02/24/2021   MCV 85 02/24/2021   PLT 277 02/24/2021   Lab Results  Component Value Date   ALT 23 02/24/2021   AST 19 02/24/2021   ALKPHOS 90 02/24/2021   BILITOT 0.2 02/24/2021   No results found for: "25OHVITD2", "25OHVITD3", "VD25OH"   Review of Systems   Constitutional:  Negative for chills, fatigue and fever.  HENT:  Positive for trouble swallowing.   Respiratory:  Positive for cough. Negative for chest tightness and wheezing.   Gastrointestinal:  Positive for abdominal pain (intermittent GERD), dysphagia and heartburn. Negative for constipation, diarrhea, nausea and vomiting.  Psychiatric/Behavioral:  Negative for dysphoric mood and sleep disturbance. The patient is not nervous/anxious.     Patient Active Problem List   Diagnosis Date Noted   Biceps tendinitis of both shoulders 03/29/2021   Rotator cuff impingement syndrome, right 01/25/2021   Cervical paraspinal muscle spasm 01/25/2021   Lateral epicondylitis, left elbow 04/13/2020   Tricompartment osteoarthritis of right knee 04/13/2020   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 02/03/2020   Mild persistent asthma without complication 69/62/9528   Benign neoplasm of cecum    Immunization, BCG 03/01/2018   Tricompartment osteoarthritis of left knee 10/11/2016   Hemifacial spasm    TIA (transient ischemic attack) 06/11/2015   Episodic tension-type headache, not intractable 04/14/2015   Angioneurotic edema 10/19/2014   Calculus of gallbladder 10/19/2014   Acid reflux 10/19/2014   Hemorrhoids, internal 10/19/2014   Restless leg syndrome 10/19/2014    No Known Allergies  Past Surgical History:  Procedure Laterality Date   BILATERAL CARPAL TUNNEL RELEASE     COLONOSCOPY WITH PROPOFOL N/A 11/01/2018   Procedure: COLONOSCOPY WITH BIOPSY;  Surgeon: [name], MD;  Location: [place];  Service: Endoscopy;  Laterality: N/A;  KNEE ARTHROSCOPY WITH MEDIAL MENISECTOMY Left 01/18/2017   Procedure: KNEE ARTHROSCOPY WITH MEDIAL MENISECTOMY ROOT REPAIR CHONDROPLASTY;  Surgeon: Leim Fabry, MD;  Location: [place];  Service: Orthopedics;  Laterality: Left;  SUPINE WITH ACL LEG HOLDER ARTHREX ALEX  FLIP CUTTER, MENISCUS ROOT GUIDE STRYKER [place] CETERIX AIR  ANESTHESIA:  ADDUCTOR CANAL NERVE BLOCK   KNEE ARTHROSCOPY WITH MEDIAL MENISECTOMY Right 07/16/2018   Procedure: KNEE ARTHROSCOPY WITH MEDIAL MENISCUS  ROOT REPAIR;  Surgeon: Leim Fabry, MD;  Location: [place];  Service: Orthopedics;  Laterality: Right;  MENSICUS ROOT GUIDE KIT + SWIVELOCKS,  SWIVELOCK TIBIAL FIXATION CETERIX OPEN KNEE TRAY BOVIE ELECTROCAUTERY   POLYPECTOMY N/A 11/01/2018   Procedure: POLYPECTOMY;  Surgeon: [name], MD;  Location: [place];  Service: Endoscopy;  Laterality: N/A;   TUBAL LIGATION     VESTIBULAR NERVE SECTION Right 2017    Social History   Tobacco Use   Smoking status: Never   Smokeless tobacco: Never  Vaping Use   Vaping Use: Never used  Substance Use Topics   Alcohol use: Not Currently    Alcohol/week: 0.0 standard drinks of alcohol   Drug use: Never     Medication list has been reviewed and updated.  Current Meds  Medication Sig   albuterol (VENTOLIN HFA) 108 (90 Base) MCG/ACT inhaler Inhale 2 puffs into the lungs every 6 (six) hours as needed for wheezing or shortness of breath.   Budesonide (PULMICORT FLEXHALER) 90 MCG/ACT inhaler Inhale 1 puff into the lungs 2 (two) times daily.   carbamazepine (TEGRETOL XR) 100 MG 12 hr tablet Take 100 mg by mouth daily as needed.   cyclobenzaprine (FLEXERIL) 10 MG tablet Take 1 tablet (10 mg total) by mouth 2 (two) times daily as needed for muscle spasms.   gabapentin (NEURONTIN) 100 MG capsule Take 1 capsule (100 mg total) by mouth every evening.   hydrocortisone (ANUSOL-HC) 2.5 % rectal cream Place 1 application rectally 2 (two) times daily.   meloxicam (MOBIC) 15 MG tablet Take 1 tablet (15 mg total) by mouth daily as needed for pain.   omeprazole (PRILOSEC) 40 MG capsule Take 1 capsule (40 mg total) by mouth daily.   triamcinolone cream (KENALOG) 0.1 % Apply 1 application topically 2 (two) times daily. To rash on feet       11/23/2021    3:30 PM 10/10/2021   10:31 AM 07/22/2021     8:08 AM 03/29/2021   10:07 AM  GAD 7 : Generalized Anxiety Score  Nervous, Anxious, on Edge 0 0 0 0  Control/stop worrying 0 0 0 0  Worry too much - different things 0 0 0 0  Trouble relaxing 0 0 0 0  Restless 0 0 0 0  Easily annoyed or irritable 0 0 0 0  Afraid - awful might happen 0 0 0 0  Total GAD 7 Score 0 0 0 0  Anxiety Difficulty Not difficult at all Not difficult at all Not difficult at all        11/23/2021    3:30 PM 10/10/2021   10:31 AM 07/22/2021    8:08 AM  Depression screen PHQ 2/9  Decreased Interest 0 0 0  Down, Depressed, Hopeless 0 0 0  PHQ - 2 Score 0 0 0  Altered sleeping 0 0 0  Tired, decreased energy 0 0 0  Change in appetite 0 0 0  Feeling bad or failure about yourself  0 0 0  Trouble concentrating 1 0  0  Moving slowly or fidgety/restless 0 0 0  Suicidal thoughts 0 0 0  PHQ-9 Score 1 0 0  Difficult doing work/chores Not difficult at all Not difficult at all Not difficult at all    BP Readings from Last 3 Encounters:  11/23/21 128/74  10/10/21 130/86  07/22/21 128/78    Physical Exam Vitals and nursing note reviewed.  Constitutional:      General: She is not in acute distress.    Appearance: She is well-developed.  HENT:     Head: Normocephalic and atraumatic.     Mouth/Throat:     Mouth: Mucous membranes are moist.     Palate: No mass and lesions.     Pharynx: Oropharynx is clear. No pharyngeal swelling, oropharyngeal exudate or posterior oropharyngeal erythema.     Tonsils: No tonsillar exudate.  Neck:     Thyroid: No thyromegaly.  Cardiovascular:     Rate and Rhythm: Normal rate and regular rhythm.  Pulmonary:     Effort: Pulmonary effort is normal. No respiratory distress.     Breath sounds: No wheezing or rhonchi.  Musculoskeletal:     Cervical back: Normal range of motion and neck supple.  Lymphadenopathy:     Cervical: No cervical adenopathy.  Skin:    General: Skin is warm and dry.     Findings: No rash.  Neurological:      Mental Status: She is alert and oriented to person, place, and time.  Psychiatric:        Mood and Affect: Mood normal.        Behavior: Behavior normal.     Wt Readings from Last 3 Encounters:  11/23/21 198 lb (89.8 kg)  10/10/21 197 lb (89.4 kg)  07/22/21 201 lb (91.2 kg)    BP 128/74   Pulse 84   Temp 98.4 F (36.9 C) (Oral)   Ht _0  (1.6 m)   Wt 198 lb (89.8 kg)   SpO2 97%   BMI 35.07 kg/m   Assessment and Plan: 1. Esophageal dysphagia New problem occurring with solids only Refer to GI - Ambulatory referral to Gastroenterology  2. Gastroesophageal reflux disease, unspecified whether esophagitis present Begin PPI daily Anti-reflux measures discussed - omeprazole (PRILOSEC) 40 MG capsule; Take 1 capsule (40 mg total) by mouth daily.  Dispense: 90 capsule; Refill: 0   Partially dictated using Editor, commissioning. Any errors are unintentional.  Halina Maidens, MD [facility]  11/23/2021

## 2022-02-07 ENCOUNTER — Encounter: Payer: Self-pay | Admitting: Internal Medicine

## 2022-02-09 ENCOUNTER — Other Ambulatory Visit: Payer: Self-pay

## 2022-02-09 ENCOUNTER — Telehealth: Payer: Self-pay

## 2022-02-09 ENCOUNTER — Ambulatory Visit
Admission: RE | Admit: 2022-02-09 | Discharge: 2022-02-09 | Disposition: A | Discharge status: Home / Self Care | Payer: Commercial Managed Care - PPO | Source: Ambulatory Visit | Attending: Infectious Diseases | Admitting: Infectious Diseases

## 2022-02-09 ENCOUNTER — Ambulatory Visit
Admission: RE | Admit: 2022-02-09 | Discharge: 2022-02-09 | Disposition: A | Discharge status: Home / Self Care | Payer: Commercial Managed Care - PPO | Attending: Infectious Diseases | Admitting: Infectious Diseases

## 2022-02-09 DIAGNOSIS — Z111 Encounter for screening for respiratory tuberculosis: Secondary | ICD-10-CM | POA: Insufficient documentation

## 2022-02-09 NOTE — Telephone Encounter (Signed)
She was treated for latent TB in the past by Dr. Delaine Lame and will need chest x-ray to show no active TB as her testing will likely always be positive.

## 2022-02-09 NOTE — Telephone Encounter (Signed)
Patient called stating she needed a TB skin test for work. Patient advised Dr. Delaine Lame is currently out of the office and we will not be able to do a PPD test until she returns in February.  Please advise

## 2022-02-09 NOTE — Telephone Encounter (Signed)
Patient advised and will get chest x ray done today. Order placed for the patient

## 2022-02-14 ENCOUNTER — Other Ambulatory Visit: Payer: Self-pay | Admitting: *Deleted

## 2022-02-14 DIAGNOSIS — D333 Benign neoplasm of cranial nerves: Secondary | ICD-10-CM

## 2022-02-28 ENCOUNTER — Other Ambulatory Visit (HOSPITAL_COMMUNITY)
Admission: RE | Admit: 2022-02-28 | Discharge: 2022-02-28 | Disposition: A | Discharge status: Home / Self Care | Payer: Commercial Managed Care - PPO | Source: Ambulatory Visit | Attending: Internal Medicine | Admitting: Internal Medicine

## 2022-02-28 ENCOUNTER — Ambulatory Visit (INDEPENDENT_AMBULATORY_CARE_PROVIDER_SITE_OTHER): Payer: Commercial Managed Care - PPO | Admitting: Internal Medicine

## 2022-02-28 ENCOUNTER — Encounter: Payer: Self-pay | Admitting: Internal Medicine

## 2022-02-28 VITALS — BP 124/70 | HR 94 | Ht 63.0 in | Wt 196.8 lb

## 2022-02-28 DIAGNOSIS — N760 Acute vaginitis: Secondary | ICD-10-CM | POA: Insufficient documentation

## 2022-02-28 DIAGNOSIS — K219 Gastro-esophageal reflux disease without esophagitis: Secondary | ICD-10-CM | POA: Diagnosis not present

## 2022-02-28 DIAGNOSIS — J4530 Mild persistent asthma, uncomplicated: Secondary | ICD-10-CM | POA: Diagnosis not present

## 2022-02-28 DIAGNOSIS — B356 Tinea cruris: Secondary | ICD-10-CM

## 2022-02-28 DIAGNOSIS — Z0000 Encounter for general adult medical examination without abnormal findings: Secondary | ICD-10-CM | POA: Diagnosis not present

## 2022-02-28 DIAGNOSIS — R7303 Prediabetes: Secondary | ICD-10-CM

## 2022-02-28 DIAGNOSIS — Z1231 Encounter for screening mammogram for malignant neoplasm of breast: Secondary | ICD-10-CM

## 2022-02-28 DIAGNOSIS — Z791 Long term (current) use of non-steroidal anti-inflammatories (NSAID): Secondary | ICD-10-CM | POA: Diagnosis not present

## 2022-02-28 DIAGNOSIS — Z23 Encounter for immunization: Secondary | ICD-10-CM

## 2022-02-28 DIAGNOSIS — G2581 Restless legs syndrome: Secondary | ICD-10-CM

## 2022-02-28 MED ORDER — FLUCONAZOLE 100 MG PO TABS: 100.0000 mg | ORAL_TABLET | Freq: Every day | ORAL | 0 refills | Status: AC

## 2022-02-28 MED ORDER — NYSTATIN-TRIAMCINOLONE 100000-0.1 UNIT/GM-% EX OINT: 1.0000 | TOPICAL_OINTMENT | Freq: Two times a day (BID) | CUTANEOUS | 0 refills | Status: DC

## 2022-02-28 MED ORDER — GABAPENTIN 100 MG PO CAPS: 100.0000 mg | ORAL_CAPSULE | Freq: Every evening | ORAL | 0 refills | Status: DC

## 2022-02-28 NOTE — Assessment & Plan Note (Signed)
Asthma is well controlled on steroid inhaler. Using albuterol intermittently.

## 2022-02-28 NOTE — Patient Instructions (Signed)
Call [facility] to schedule your mammogram at [phone].  

## 2022-02-28 NOTE — Progress Notes (Signed)
Date:  02/28/2022   Name:  Theresa Lester   DOB:  1970/10/10   MRN:  [id number]   Chief Complaint: Annual Exam and Vaginitis (Vaginal itching, bumps on labia.) Theresa Lester is a 52 y.o. female who presents today for her Complete Annual Exam. She feels well. She reports stretching. She reports she is sleeping well. Breast complaints - none.  Mammogram: 04/2021 DEXA: none Pap smear: 02/2021 neg/neg Colonoscopy: 10/2018 repeat 5 yrs  Health Maintenance Due  Topic Date Due   COVID-19 Vaccine (4 - 2023-24 season) 09/16/2021    Immunization History  Administered Date(s) Administered   Hepatitis B, adult 02/27/2018, 03/29/2018, 08/30/2018   Influenza, Seasonal, Injecte, Preservative Fre 01/31/2012   Influenza,inj,Quad PF,6+ Mos 12/18/2013, 10/20/2014, 10/11/2016, 09/13/2018, 11/03/2019, 11/08/2020, 10/26/2021   Moderna Sars-Covid-2 Vaccination 05/06/2019, 06/03/2019, 01/28/2020   PNEUMOCOCCAL CONJUGATE-20 02/28/2022   PPD Test 02/25/2018   Tdap 09/11/2017   Zoster Recombinat (Shingrix) 07/11/2019, 10/14/2019    Asthma There is no cough, shortness of breath or wheezing. The problem has been unchanged. Pertinent negatives include no appetite change, chest pain, fever, headaches or trouble swallowing. Her symptoms are alleviated by beta-agonist and steroid inhaler. She reports significant improvement on treatment. Her past medical history is significant for asthma.  Gastroesophageal Reflux She reports no abdominal pain, no chest pain, no coughing or no wheezing. This is a recurrent problem. The problem occurs occasionally. Pertinent negatives include no fatigue. She has tried a PPI for the symptoms.  Rash This is a chronic problem. The current episode started more than 1 month ago. The affected locations include the groin. The rash is characterized by itchiness and redness. She was exposed to nothing. Pertinent negatives include no congestion, cough, diarrhea, fatigue,  fever, shortness of breath or vomiting. Her past medical history is significant for asthma.    Lab Results  Component Value Date   NA 139 02/24/2021   K 4.5 02/24/2021   CO2 25 02/24/2021   GLUCOSE 90 02/24/2021   BUN 17 02/24/2021   CREATININE 0.69 02/24/2021   CALCIUM 9.4 02/24/2021   EGFR 104 02/24/2021   GFRNONAA >60 01/19/2020   Lab Results  Component Value Date   CHOL 156 02/24/2021   HDL 53 02/24/2021   LDLCALC 82 02/24/2021   TRIG 115 02/24/2021   CHOLHDL 2.9 02/24/2021   Lab Results  Component Value Date   TSH 2.890 02/24/2021   Lab Results  Component Value Date   HGBA1C 5.9 (H) 02/24/2021   Lab Results  Component Value Date   WBC 7.7 02/24/2021   HGB 14.6 02/24/2021   HCT 44.7 02/24/2021   MCV 85 02/24/2021   PLT 277 02/24/2021   Lab Results  Component Value Date   ALT 23 02/24/2021   AST 19 02/24/2021   ALKPHOS 90 02/24/2021   BILITOT 0.2 02/24/2021   No results found for: "25OHVITD2", "25OHVITD3", "VD25OH"   Review of Systems  Constitutional:  Negative for appetite change, chills, fatigue, fever and unexpected weight change.  HENT:  Negative for congestion, hearing loss, tinnitus, trouble swallowing and voice change.   Eyes:  Negative for visual disturbance.  Respiratory:  Negative for cough, chest tightness, shortness of breath and wheezing.   Cardiovascular:  Negative for chest pain, palpitations and leg swelling.  Gastrointestinal:  Negative for abdominal pain, constipation, diarrhea and vomiting.  Endocrine: Negative for polydipsia and polyuria.  Genitourinary:  Positive for vaginal discharge (mimimal). Negative for dysuria, frequency, genital sores, hematuria and vaginal bleeding.  Musculoskeletal:  Negative for arthralgias, gait problem and joint swelling.  Skin:  Positive for rash. Negative for color change.  Neurological:  Negative for dizziness, tremors, light-headedness, numbness and headaches.  Hematological:  Negative for adenopathy.  Does not bruise/bleed easily.  Psychiatric/Behavioral:  Negative for dysphoric mood and sleep disturbance. The patient is not nervous/anxious.     Patient Active Problem List   Diagnosis Date Noted   Biceps tendinitis of both shoulders 03/29/2021   Rotator cuff impingement syndrome, right 01/25/2021   Cervical paraspinal muscle spasm 01/25/2021   Lateral epicondylitis, left elbow 04/13/2020   Tricompartment osteoarthritis of right knee 04/13/2020   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 02/03/2020   Mild persistent asthma without complication [id number]   Benign neoplasm of cecum    Immunization, BCG 03/01/2018   Tricompartment osteoarthritis of left knee 10/11/2016   Hemifacial spasm    TIA (transient ischemic attack) 06/11/2015   Episodic tension-type headache, not intractable 04/14/2015   Angioneurotic edema 10/19/2014   Calculus of gallbladder 10/19/2014   GERD (gastroesophageal reflux disease) 10/19/2014   Hemorrhoids, internal 10/19/2014   Restless leg syndrome 10/19/2014    No Known Allergies  Past Surgical History:  Procedure Laterality Date   BILATERAL CARPAL TUNNEL RELEASE     COLONOSCOPY WITH PROPOFOL N/A 11/01/2018   Procedure: COLONOSCOPY WITH BIOPSY;  Surgeon: [name], MD;  Location: [place];  Service: Endoscopy;  Laterality: N/A;   KNEE ARTHROSCOPY WITH MEDIAL MENISECTOMY Left 01/18/2017   Procedure: KNEE ARTHROSCOPY WITH MEDIAL MENISECTOMY ROOT REPAIR CHONDROPLASTY;  Surgeon: Leim Fabry, MD;  Location: [place];  Service: Orthopedics;  Laterality: Left;  SUPINE WITH ACL LEG HOLDER ARTHREX ALEX  FLIP CUTTER, MENISCUS ROOT GUIDE STRYKER [place] CETERIX AIR  ANESTHESIA: ADDUCTOR CANAL NERVE BLOCK   KNEE ARTHROSCOPY WITH MEDIAL MENISECTOMY Right 07/16/2018   Procedure: KNEE ARTHROSCOPY WITH MEDIAL MENISCUS  ROOT REPAIR;  Surgeon: Leim Fabry, MD;  Location: [place];  Service: Orthopedics;  Laterality: Right;  MENSICUS ROOT GUIDE KIT +  SWIVELOCKS,  SWIVELOCK TIBIAL FIXATION CETERIX OPEN KNEE TRAY BOVIE ELECTROCAUTERY   POLYPECTOMY N/A 11/01/2018   Procedure: POLYPECTOMY;  Surgeon: [name], MD;  Location: [place];  Service: Endoscopy;  Laterality: N/A;   TUBAL LIGATION     VESTIBULAR NERVE SECTION Right 2017    Social History   Tobacco Use   Smoking status: Never   Smokeless tobacco: Never  Vaping Use   Vaping Use: Never used  Substance Use Topics   Alcohol use: Not Currently    Alcohol/week: 0.0 standard drinks of alcohol   Drug use: Never     Medication list has been reviewed and updated.  Current Meds  Medication Sig   albuterol (VENTOLIN HFA) 108 (90 Base) MCG/ACT inhaler Inhale 2 puffs into the lungs every 6 (six) hours as needed for wheezing or shortness of breath.   Budesonide (PULMICORT FLEXHALER) 90 MCG/ACT inhaler Inhale 1 puff into the lungs 2 (two) times daily.   carbamazepine (TEGRETOL XR) 100 MG 12 hr tablet Take 100 mg by mouth daily as needed.   cyclobenzaprine (FLEXERIL) 10 MG tablet Take 1 tablet (10 mg total) by mouth 2 (two) times daily as needed for muscle spasms.   fluconazole (DIFLUCAN) 100 MG tablet Take 1 tablet (100 mg total) by mouth daily for 3 days.   hydrocortisone (ANUSOL-HC) 2.5 % rectal cream Place 1 application rectally 2 (two) times daily.   meloxicam (MOBIC) 15 MG tablet Take 1 tablet (15 mg total)  by mouth daily as needed for pain.   nystatin-triamcinolone ointment (MYCOLOG) Apply 1 Application topically 2 (two) times daily.   omeprazole (PRILOSEC) 40 MG capsule Take 1 capsule (40 mg total) by mouth daily.   triamcinolone cream (KENALOG) 0.1 % Apply 1 application topically 2 (two) times daily. To rash on feet   [DISCONTINUED] gabapentin (NEURONTIN) 100 MG capsule Take 1 capsule (100 mg total) by mouth every evening.       02/28/2022    9:47 AM 11/23/2021    3:30 PM 10/10/2021   10:31 AM 07/22/2021    8:08 AM  GAD 7 : Generalized Anxiety Score  Nervous,  Anxious, on Edge 0 0 0 0  Control/stop worrying 0 0 0 0  Worry too much - different things 0 0 0 0  Trouble relaxing 0 0 0 0  Restless 1 0 0 0  Easily annoyed or irritable 0 0 0 0  Afraid - awful might happen 1 0 0 0  Total GAD 7 Score 2 0 0 0  Anxiety Difficulty Not difficult at all Not difficult at all Not difficult at all Not difficult at all       02/28/2022    9:47 AM 11/23/2021    3:30 PM 10/10/2021   10:31 AM  Depression screen PHQ 2/9  Decreased Interest 0 0 0  Down, Depressed, Hopeless 0 0 0  PHQ - 2 Score 0 0 0  Altered sleeping 0 0 0  Tired, decreased energy 1 0 0  Change in appetite 1 0 0  Feeling bad or failure about yourself  0 0 0  Trouble concentrating 0 1 0  Moving slowly or fidgety/restless 1 0 0  Suicidal thoughts 0 0 0  PHQ-9 Score 3 1 0  Difficult doing work/chores Not difficult at all Not difficult at all Not difficult at all    BP Readings from Last 3 Encounters:  02/28/22 124/70  11/23/21 128/74  10/10/21 130/86    Physical Exam Vitals and nursing note reviewed.  Constitutional:      General: She is not in acute distress.    Appearance: She is well-developed.  HENT:     Head: Normocephalic and atraumatic.     Right Ear: Tympanic membrane and ear canal normal.     Left Ear: Tympanic membrane and ear canal normal.     Nose:     Right Sinus: No maxillary sinus tenderness.     Left Sinus: No maxillary sinus tenderness.  Eyes:     General: No scleral icterus.       Right eye: No discharge.        Left eye: No discharge.     Conjunctiva/sclera: Conjunctivae normal.  Neck:     Thyroid: No thyromegaly.     Vascular: No carotid bruit.  Cardiovascular:     Rate and Rhythm: Normal rate and regular rhythm.     Pulses: Normal pulses.     Heart sounds: Normal heart sounds.  Pulmonary:     Effort: Pulmonary effort is normal. No respiratory distress.     Breath sounds: No wheezing.  Chest:  Breasts:    Right: No mass, nipple discharge, skin  change or tenderness.     Left: No mass, nipple discharge, skin change or tenderness.  Abdominal:     General: Bowel sounds are normal.     Palpations: Abdomen is soft.     Tenderness: There is no abdominal tenderness.  Genitourinary:    Labia:  Right: Rash present. No tenderness, lesion or injury.        Left: Rash present. No tenderness, lesion or injury.      Comments: Rash c/w tinea Musculoskeletal:     Cervical back: Normal range of motion. No erythema.     Right lower leg: No edema.     Left lower leg: No edema.  Lymphadenopathy:     Cervical: No cervical adenopathy.  Skin:    General: Skin is warm and dry.     Capillary Refill: Capillary refill takes less than 2 seconds.     Findings: No rash.  Neurological:     Mental Status: She is alert and oriented to person, place, and time.     Cranial Nerves: No cranial nerve deficit.     Sensory: No sensory deficit.     Deep Tendon Reflexes: Reflexes are normal and symmetric.  Psychiatric:        Attention and Perception: Attention normal.        Mood and Affect: Mood normal.     Wt Readings from Last 3 Encounters:  02/28/22 196 lb 12.8 oz (89.3 kg)  11/23/21 198 lb (89.8 kg)  10/10/21 197 lb (89.4 kg)    BP 124/70   Pulse 94   Ht 5' 3"$  (1.6 m)   Wt 196 lb 12.8 oz (89.3 kg)   SpO2 97%   BMI 34.86 kg/m   Assessment and Plan: Problem List Items Addressed This Visit       Respiratory   Mild persistent asthma without complication (Chronic)    Asthma is well controlled on steroid inhaler. Using albuterol intermittently.      Relevant Orders   CBC with Differential/Platelet     Digestive   GERD (gastroesophageal reflux disease)    Symptoms well controlled on daily PPI No red flag signs such as weight loss, n/v, melena Will continue omeprazole.       Relevant Orders   CBC with Differential/Platelet     Other   Restless leg syndrome (Chronic)   Relevant Medications   gabapentin (NEURONTIN) 100 MG  capsule   Other Visit Diagnoses     Annual physical exam    -  Primary   up to date on immunizations except for Prevnar   Relevant Orders   CBC with Differential/Platelet   Comprehensive metabolic panel   Hemoglobin A1c   Lipid panel   TSH   Encounter for screening mammogram for breast cancer       schedule at [facility]   Relevant Orders   MM 3D SCREEN BREAST BILATERAL   Prediabetes       Last A1C 5.9   Relevant Orders   Hemoglobin A1c   Encounter for long-term (current) use of NSAIDs       Relevant Orders   Comprehensive metabolic panel   Need for vaccination for pneumococcus       Relevant Orders   Pneumococcal conjugate vaccine 20-valent (Completed)   Acute vaginitis       will send swab for trich/BV/candida   Relevant Orders   Cervicovaginal ancillary only   Tinea cruris       Relevant Medications   fluconazole (DIFLUCAN) 100 MG tablet   nystatin-triamcinolone ointment (MYCOLOG)        Partially dictated using Editor, commissioning. Any errors are unintentional.  Halina Maidens, MD [facility]  02/28/2022

## 2022-02-28 NOTE — Assessment & Plan Note (Signed)
Symptoms well controlled on daily PPI No red flag signs such as weight loss, n/v, melena Will continue omeprazole.

## 2022-03-01 ENCOUNTER — Other Ambulatory Visit: Payer: Self-pay | Admitting: Internal Medicine

## 2022-03-01 DIAGNOSIS — B9689 Other specified bacterial agents as the cause of diseases classified elsewhere: Secondary | ICD-10-CM

## 2022-03-01 LAB — COMPREHENSIVE METABOLIC PANEL
ALT: 33 IU/L — ABNORMAL HIGH (ref 0–32)
AST: 25 IU/L (ref 0–40)
Albumin/Globulin Ratio: 1.6 (ref 1.2–2.2)
Albumin: 4.4 g/dL (ref 3.8–4.9)
Alkaline Phosphatase: 106 IU/L (ref 44–121)
BUN/Creatinine Ratio: 17 (ref 9–23)
BUN: 12 mg/dL (ref 6–24)
Bilirubin Total: 0.3 mg/dL (ref 0.0–1.2)
CO2: 22 mmol/L (ref 20–29)
Calcium: 9.2 mg/dL (ref 8.7–10.2)
Chloride: 100 mmol/L (ref 96–106)
Creatinine, Ser: 0.72 mg/dL (ref 0.57–1.00)
Globulin, Total: 2.8 g/dL (ref 1.5–4.5)
Glucose: 92 mg/dL (ref 70–99)
Potassium: 4.4 mmol/L (ref 3.5–5.2)
Sodium: 139 mmol/L (ref 134–144)
Total Protein: 7.2 g/dL (ref 6.0–8.5)
eGFR: 100 mL/min/{1.73_m2} (ref >59)

## 2022-03-01 LAB — CBC WITH DIFFERENTIAL/PLATELET
Basophils Absolute: 0.1 10*3/uL (ref 0.0–0.2)
Basos: 1 %
EOS (ABSOLUTE): 0.3 10*3/uL (ref 0.0–0.4)
Eos: 3 %
Hematocrit: 43.1 % (ref 34.0–46.6)
Hemoglobin: 14.1 g/dL (ref 11.1–15.9)
Immature Grans (Abs): 0 10*3/uL (ref 0.0–0.1)
Immature Granulocytes: 0 %
Lymphocytes Absolute: 2.5 10*3/uL (ref 0.7–3.1)
Lymphs: 23 %
MCH: 27.2 pg (ref 26.6–33.0)
MCHC: 32.7 g/dL (ref 31.5–35.7)
MCV: 83 fL (ref 79–97)
Monocytes Absolute: 0.8 10*3/uL (ref 0.1–0.9)
Monocytes: 7 %
Neutrophils Absolute: 7.6 10*3/uL — ABNORMAL HIGH (ref 1.4–7.0)
Neutrophils: 66 %
Platelets: 321 10*3/uL (ref 150–450)
RBC: 5.18 x10E6/uL (ref 3.77–5.28)
RDW: 13.2 % (ref 11.7–15.4)
WBC: 11.3 10*3/uL — ABNORMAL HIGH (ref 3.4–10.8)

## 2022-03-01 LAB — LIPID PANEL
Chol/HDL Ratio: 3 ratio (ref 0.0–4.4)
Cholesterol, Total: 169 mg/dL (ref 100–199)
HDL: 56 mg/dL (ref >39)
LDL Chol Calc (NIH): 92 mg/dL (ref 0–99)
Triglycerides: 118 mg/dL (ref 0–149)
VLDL Cholesterol Cal: 21 mg/dL (ref 5–40)

## 2022-03-01 LAB — TSH: TSH: 2.5 u[IU]/mL (ref 0.450–4.500)

## 2022-03-01 LAB — CERVICOVAGINAL ANCILLARY ONLY
Bacterial Vaginitis (gardnerella): POSITIVE — AB
Candida Glabrata: NEGATIVE
Candida Vaginitis: NEGATIVE
Comment: NEGATIVE
Comment: NEGATIVE
Comment: NEGATIVE
Comment: NEGATIVE
Trichomonas: NEGATIVE

## 2022-03-01 LAB — HEMOGLOBIN A1C
Est. average glucose Bld gHb Est-mCnc: 131 mg/dL
Hgb A1c MFr Bld: 6.2 % — ABNORMAL HIGH (ref 4.8–5.6)

## 2022-03-01 MED ORDER — METRONIDAZOLE 500 MG PO TABS: 500.0000 mg | ORAL_TABLET | Freq: Two times a day (BID) | ORAL | 0 refills | Status: AC

## 2022-04-28 ENCOUNTER — Other Ambulatory Visit: Payer: Self-pay | Admitting: Internal Medicine

## 2022-04-28 DIAGNOSIS — K219 Gastro-esophageal reflux disease without esophagitis: Secondary | ICD-10-CM

## 2022-04-28 DIAGNOSIS — B356 Tinea cruris: Secondary | ICD-10-CM

## 2022-05-01 NOTE — Telephone Encounter (Signed)
Requested medication (s) are due for refill today: Yes  Requested medication (s) are on the active medication list: Yes  Last refill:  02/28/22  Future visit scheduled: Yes  Notes to clinic:  Protocol not assigned.    Requested Prescriptions  Pending Prescriptions Disp Refills   nystatin-triamcinolone ointment (MYCOLOG) [Pharmacy Med Name: NYSTATIN/TRIAMCINOLONE OINT 30GM] 30 g 0    Sig: APPLY TOPICALLY TO THE AFFECTED AREA TWICE DAILY     Off-Protocol Failed - 04/28/2022  7:45 PM      Failed - Medication not assigned to a protocol, review manually.      Passed - Valid encounter within last 12 months    Recent Outpatient Visits           2 months ago Annual physical exam   [place] [facility] at [facility], Nyoka Cowden, MD   5 months ago Esophageal dysphagia   [place] [facility] at [facility], Nyoka Cowden, MD   6 months ago Acute non-recurrent maxillary sinusitis   [place] [facility] at [facility], Nyoka Cowden, MD   9 months ago Biceps tendinitis of both shoulders   [place] [facility] at [facility], Ocie Bob, MD   1 year ago Biceps tendinitis of both shoulders   [place] [facility] at [facility], Ocie Bob, MD       Future Appointments             In 10 months Judithann Graves Nyoka Cowden, MD [facility] Primary Care & Sports Medicine at [facility], [facility]            Signed Prescriptions Disp Refills   omeprazole (PRILOSEC) 40 MG capsule 90 capsule 0    Sig: TAKE 1 CAPSULE(40 MG) BY MOUTH DAILY     Gastroenterology: Proton Pump Inhibitors Passed - 04/28/2022  7:45 PM      Passed - Valid encounter within last 12 months    Recent Outpatient Visits           2 months ago Annual physical exam   [place] [facility] at [facility], Nyoka Cowden, MD   5 months ago  Esophageal dysphagia   [facility] Medicine at [facility], Nyoka Cowden, MD   6 months ago Acute non-recurrent maxillary sinusitis   [place] [facility] at [facility], Nyoka Cowden, MD   9 months ago Biceps tendinitis of both shoulders   [facility] at [facility], Ocie Bob, MD   1 year ago Biceps tendinitis of both shoulders   [facility] & Sports Medicine at [facility], Ocie Bob, MD       Future Appointments             In 10 months Judithann Graves Nyoka Cowden, MD [facility] Primary Care & Sports Medicine at [facility], [facility]            2

## 2022-05-01 NOTE — Telephone Encounter (Signed)
Requested Prescriptions  Pending Prescriptions Disp Refills   nystatin-triamcinolone ointment (MYCOLOG) [Pharmacy Med Name: NYSTATIN/TRIAMCINOLONE OINT 30GM] 30 g 0    Sig: APPLY TOPICALLY TO THE AFFECTED AREA TWICE DAILY     Off-Protocol Failed - 04/28/2022  7:45 PM      Failed - Medication not assigned to a protocol, review manually.      Passed - Valid encounter within last 12 months    Recent Outpatient Visits           2 months ago Annual physical exam   [place] [facility] at [facility], Nyoka Cowden, MD   5 months ago Esophageal dysphagia   [place] [facility] at [facility], Nyoka Cowden, MD   6 months ago Acute non-recurrent maxillary sinusitis   [place] [facility] at [facility], Nyoka Cowden, MD   9 months ago Biceps tendinitis of both shoulders   [place] [facility] at [facility], Ocie Bob, MD   1 year ago Biceps tendinitis of both shoulders   [place] [facility] at [facility], Ocie Bob, MD       Future Appointments             In 10 months Judithann Graves Nyoka Cowden, MD [facility] Primary [facility] Medicine at [facility] [place], [facility]             omeprazole (PRILOSEC) 40 MG capsule [Pharmacy Med Name: OMEPRAZOLE 40MG  CAPSULES] 90 capsule 0    Sig: TAKE 1 CAPSULE(40 MG) BY MOUTH DAILY     Gastroenterology: Proton Pump Inhibitors Passed - 04/28/2022  7:45 PM      Passed - Valid encounter within last 12 months    Recent Outpatient Visits           2 months ago Annual physical exam   [place] [facility] at [facility], Nyoka Cowden, MD   5 months ago Esophageal dysphagia   [facility] at [facility], Nyoka Cowden, MD   6 months ago Acute non-recurrent maxillary sinusitis    Primary Care & Sports Medicine at [facility], Nyoka Cowden, MD   9 months ago Biceps tendinitis of both shoulders   [facility] & Sports Medicine at [facility], Ocie Bob, MD   1 year ago Biceps tendinitis of both shoulders   [facility] Medicine at [facility], Ocie Bob, MD       Future Appointments             In 10 months Judithann Graves Nyoka Cowden, MD [facility] Sports Medicine at [facility], [facility]

## 2022-06-17 ENCOUNTER — Other Ambulatory Visit: Payer: Self-pay | Admitting: Internal Medicine

## 2022-06-17 DIAGNOSIS — G2581 Restless legs syndrome: Secondary | ICD-10-CM

## 2022-06-19 NOTE — Telephone Encounter (Signed)
Requested Prescriptions  Pending Prescriptions Disp Refills   gabapentin (NEURONTIN) 100 MG capsule [Pharmacy Med Name: GABAPENTIN 100MG  CAPSULES] 90 capsule 2    Sig: TAKE 1 CAPSULE(100 MG) BY MOUTH EVERY EVENING     Neurology: Anticonvulsants - gabapentin Passed - 06/17/2022  8:21 PM      Passed - Cr in normal range and within 360 days    Creatinine  Date Value Ref Range Status  02/17/2015 0.7 0.6 - 1.1 mg/dL Final   Creatinine, Ser  Date Value Ref Range Status  02/28/2022 0.72 0.57 - 1.00 mg/dL Final         Passed - Completed PHQ-2 or PHQ-9 in the last 360 days      Passed - Valid encounter within last 12 months    Recent Outpatient Visits           3 months ago Annual physical exam   [place] [facility] at [facility], Nyoka Cowden, MD   6 months ago Esophageal dysphagia   [facility] Medicine at [facility], Nyoka Cowden, MD   8 months ago Acute non-recurrent maxillary sinusitis   [place] [facility] at [facility], Nyoka Cowden, MD   11 months ago Biceps tendinitis of both shoulders   [facility] at [facility], Ocie Bob, MD   1 year ago Biceps tendinitis of both shoulders   [facility] Primary [facility] & Sports Medicine at [facility], Ocie Bob, MD       Future Appointments             In 8 months Judithann Graves, Nyoka Cowden, MD [facility] Primary Care & Sports Medicine at [facility], [facility]

## 2022-08-23 ENCOUNTER — Other Ambulatory Visit: Payer: Self-pay | Admitting: Internal Medicine

## 2022-08-23 DIAGNOSIS — B356 Tinea cruris: Secondary | ICD-10-CM

## 2022-08-29 ENCOUNTER — Ambulatory Visit (INDEPENDENT_AMBULATORY_CARE_PROVIDER_SITE_OTHER): Payer: Commercial Managed Care - PPO | Admitting: Internal Medicine

## 2022-08-29 ENCOUNTER — Encounter: Payer: Self-pay | Admitting: Internal Medicine

## 2022-08-29 VITALS — BP 118/70 | HR 77 | Temp 98.1°F | Ht 63.0 in | Wt 200.0 lb

## 2022-08-29 DIAGNOSIS — R0981 Nasal congestion: Secondary | ICD-10-CM

## 2022-08-29 MED ORDER — FLUTICASONE PROPIONATE 50 MCG/ACT NA SUSP: 2.0000 | Freq: Every day | NASAL | 6 refills | Status: DC

## 2022-08-29 MED ORDER — FEXOFENADINE HCL 180 MG PO TABS: 180.0000 mg | ORAL_TABLET | Freq: Every day | ORAL | 1 refills | Status: DC

## 2022-08-29 NOTE — Progress Notes (Signed)
Date:  08/29/2022   Name:  Theresa Lester   DOB:  [birth year]   MRN:  [id number]   Chief Complaint: Cough (X 1 month. Sinus pressure and congestion. Cough- clear production. Taking OTC flu medicine and its not helping. Patient has been tested for Covid several times at home, and they all were negative. )  URI  This is a new problem. The current episode started 1 to 4 weeks ago. The problem has been unchanged. There has been no fever. Associated symptoms include congestion, coughing, headaches and a plugged ear sensation. Pertinent negatives include no chest pain, diarrhea, dysuria, nausea, sinus pain, sore throat, swollen glands, vomiting or wheezing. Treatments tried: alkaseltzer cold. The treatment provided no relief.    Lab Results  Component Value Date   NA 139 02/28/2022   K 4.4 02/28/2022   CO2 22 02/28/2022   GLUCOSE 92 02/28/2022   BUN 12 02/28/2022   CREATININE 0.72 02/28/2022   CALCIUM 9.2 02/28/2022   EGFR 100 02/28/2022   GFRNONAA >60 01/19/2020   Lab Results  Component Value Date   CHOL 169 02/28/2022   HDL 56 02/28/2022   LDLCALC 92 02/28/2022   TRIG 118 02/28/2022   CHOLHDL 3.0 02/28/2022   Lab Results  Component Value Date   TSH 2.500 02/28/2022   Lab Results  Component Value Date   HGBA1C 6.2 (H) 02/28/2022   Lab Results  Component Value Date   WBC 11.3 (H) 02/28/2022   HGB 14.1 02/28/2022   HCT 43.1 02/28/2022   MCV 83 02/28/2022   PLT 321 02/28/2022   Lab Results  Component Value Date   ALT 33 (H) 02/28/2022   AST 25 02/28/2022   ALKPHOS 106 02/28/2022   BILITOT 0.3 02/28/2022   No results found for: "25OHVITD2", "25OHVITD3", "VD25OH"   Review of Systems  Constitutional:  Negative for chills, fatigue and fever.  HENT:  Positive for congestion and postnasal drip. Negative for sinus pain and sore throat.   Respiratory:  Positive for cough. Negative for chest tightness, shortness of breath and wheezing.   Cardiovascular:  Negative  for chest pain.  Gastrointestinal:  Negative for diarrhea, nausea and vomiting.  Genitourinary:  Negative for dysuria.  Neurological:  Positive for headaches. Negative for dizziness.    Patient Active Problem List   Diagnosis Date Noted   Biceps tendinitis of both shoulders 03/29/2021   Rotator cuff impingement syndrome, right 01/25/2021   Cervical paraspinal muscle spasm 01/25/2021   Lateral epicondylitis, left elbow 04/13/2020   Tricompartment osteoarthritis of right knee 04/13/2020   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 02/03/2020   Mild persistent asthma without complication 01/19/2020   Benign neoplasm of cecum    Immunization, BCG 03/01/2018   Tricompartment osteoarthritis of left knee 10/11/2016   Hemifacial spasm    TIA (transient ischemic attack) 06/11/2015   Episodic tension-type headache, not intractable 04/14/2015   Angioneurotic edema 10/19/2014   Calculus of gallbladder 10/19/2014   GERD (gastroesophageal reflux disease) 10/19/2014   Hemorrhoids, internal 10/19/2014   Restless leg syndrome 10/19/2014    No Known Allergies  Past Surgical History:  Procedure Laterality Date   BILATERAL CARPAL TUNNEL RELEASE     COLONOSCOPY WITH PROPOFOL N/A 11/01/2018   Procedure: COLONOSCOPY WITH BIOPSY;  Surgeon: Midge Minium, MD;  Location: [facility];  Service: Endoscopy;  Laterality: N/A;   KNEE ARTHROSCOPY WITH MEDIAL MENISECTOMY Left 01/18/2017   Procedure: KNEE ARTHROSCOPY WITH MEDIAL MENISECTOMY ROOT REPAIR CHONDROPLASTY;  Surgeon: Signa Kell,  MD;  Location: [facility];  Service: Orthopedics;  Laterality: Left;  SUPINE WITH ACL LEG HOLDER ARTHREX ALEX  FLIP CUTTER, MENISCUS ROOT GUIDE STRYKER [place] CETERIX AIR  ANESTHESIA: ADDUCTOR CANAL NERVE BLOCK   KNEE ARTHROSCOPY WITH MEDIAL MENISECTOMY Right 07/16/2018   Procedure: KNEE ARTHROSCOPY WITH MEDIAL MENISCUS  ROOT REPAIR;  Surgeon: Signa Kell, MD;  Location: [facility];  Service: Orthopedics;   Laterality: Right;  MENSICUS ROOT GUIDE KIT + SWIVELOCKS,  SWIVELOCK TIBIAL FIXATION CETERIX OPEN KNEE TRAY BOVIE ELECTROCAUTERY   POLYPECTOMY N/A 11/01/2018   Procedure: POLYPECTOMY;  Surgeon: Midge Minium, MD;  Location[facility];  Service: Endoscopy;  Laterality: N/A;   TUBAL LIGATION     VESTIBULAR NERVE SECTION Right 2017    Social History   Tobacco Use   Smoking status: Never   Smokeless tobacco: Never  Vaping Use   Vaping status: Never Used  Substance Use Topics   Alcohol use: Not Currently    Alcohol/week: 0.0 standard drinks of alcohol   Drug use: Never     Medication list has been reviewed and updated.  Current Meds  Medication Sig   albuterol (VENTOLIN HFA) 108 (90 Base) MCG/ACT inhaler Inhale 2 puffs into the lungs every 6 (six) hours as needed for wheezing or shortness of breath.   Budesonide (PULMICORT FLEXHALER) 90 MCG/ACT inhaler Inhale 1 puff into the lungs 2 (two) times daily.   carbamazepine (TEGRETOL XR) 100 MG 12 hr tablet Take 100 mg by mouth daily as needed.   cyclobenzaprine (FLEXERIL) 10 MG tablet Take 1 tablet (10 mg total) by mouth 2 (two) times daily as needed for muscle spasms.   fexofenadine (ALLEGRA ALLERGY) 180 MG tablet Take 1 tablet (180 mg total) by mouth daily.   fluticasone (FLONASE) 50 MCG/ACT nasal spray Place 2 sprays into both nostrils daily.   gabapentin (NEURONTIN) 100 MG capsule TAKE 1 CAPSULE(100 MG) BY MOUTH EVERY EVENING   hydrocortisone (ANUSOL-HC) 2.5 % rectal cream Place 1 application rectally 2 (two) times daily.   meloxicam (MOBIC) 15 MG tablet Take 1 tablet (15 mg total) by mouth daily as needed for pain.   nystatin-triamcinolone ointment (MYCOLOG) APPLY TOPICALLY TO THE AFFECTED AREA TWICE DAILY   omeprazole (PRILOSEC) 40 MG capsule TAKE 1 CAPSULE(40 MG) BY MOUTH DAILY   triamcinolone cream (KENALOG) 0.1 % Apply 1 application topically 2 (two) times daily. To rash on feet       08/29/2022    1:27 PM 02/28/2022     9:47 AM 11/23/2021    3:30 PM 10/10/2021   10:31 AM  GAD 7 : Generalized Anxiety Score  Nervous, Anxious, on Edge 0 0 0 0  Control/stop worrying 0 0 0 0  Worry too much - different things 0 0 0 0  Trouble relaxing 0 0 0 0  Restless 0 1 0 0  Easily annoyed or irritable 0 0 0 0  Afraid - awful might happen 0 1 0 0  Total GAD 7 Score 0 2 0 0  Anxiety Difficulty Not difficult at all Not difficult at all Not difficult at all Not difficult at all       08/29/2022    1:27 PM 02/28/2022    9:47 AM 11/23/2021    3:30 PM  Depression screen PHQ 2/9  Decreased Interest 0 0 0  Down, Depressed, Hopeless 0 0 0  PHQ - 2 Score 0 0 0  Altered sleeping 0 0 0  Tired, decreased energy 0 1  0  Change in appetite 0 1 0  Feeling bad or failure about yourself  0 0 0  Trouble concentrating 0 0 1  Moving slowly or fidgety/restless 0 1 0  Suicidal thoughts 0 0 0  PHQ-9 Score 0 3 1  Difficult doing work/chores Not difficult at all Not difficult at all Not difficult at all    BP Readings from Last 3 Encounters:  08/29/22 118/70  02/28/22 124/70  11/23/21 128/74    Physical Exam Vitals and nursing note reviewed.  Constitutional:      General: She is not in acute distress.    Appearance: Normal appearance. She is well-developed.  HENT:     Head: Normocephalic and atraumatic.     Right Ear: Tympanic membrane and ear canal normal.     Left Ear: Tympanic membrane and ear canal normal.     Nose:     Right Sinus: No maxillary sinus tenderness or frontal sinus tenderness.     Left Sinus: No maxillary sinus tenderness or frontal sinus tenderness.     Mouth/Throat:     Tongue: No lesions.     Pharynx: Oropharynx is clear.     Tonsils: No tonsillar exudate.  Cardiovascular:     Rate and Rhythm: Normal rate and regular rhythm.  Pulmonary:     Effort: Pulmonary effort is normal. No respiratory distress.     Breath sounds: No wheezing or rhonchi.  Musculoskeletal:     Cervical back: Normal range of  motion.  Lymphadenopathy:     Cervical: No cervical adenopathy.  Skin:    General: Skin is warm and dry.     Findings: No rash.  Neurological:     Mental Status: She is alert and oriented to person, place, and time.  Psychiatric:        Mood and Affect: Mood normal.        Behavior: Behavior normal.     Wt Readings from Last 3 Encounters:  08/29/22 200 lb (90.7 kg)  02/28/22 196 lb 12.8 oz (89.3 kg)  11/23/21 198 lb (89.8 kg)    BP 118/70 (BP Location: Right Arm, Cuff Size: Large)   Pulse 77   Temp 98.1 F (36.7 C) (Oral)   Ht 5\' 3"  (1.6 m)   Wt 200 lb (90.7 kg)   SpO2 98%   BMI 35.43 kg/m   Assessment and Plan:  Problem List Items Addressed This Visit   None Visit Diagnoses     Nasal sinus congestion    -  Primary   no indication for antibiotics at this time recommend Allegra daily and Flonase daily then continued if helpful   Relevant Medications   fexofenadine (ALLEGRA ALLERGY) 180 MG tablet   fluticasone (FLONASE) 50 MCG/ACT nasal spray       No follow-ups on file.    Reubin Milan, MD [facility] Primary Care and Sports Medicine [place]

## 2022-11-20 ENCOUNTER — Other Ambulatory Visit: Payer: Self-pay | Admitting: Internal Medicine

## 2022-11-20 DIAGNOSIS — R21 Rash and other nonspecific skin eruption: Secondary | ICD-10-CM

## 2022-11-20 DIAGNOSIS — B356 Tinea cruris: Secondary | ICD-10-CM

## 2022-11-21 NOTE — Telephone Encounter (Signed)
Requested medication (s) are due for refill today: Yes  Requested medication (s) are on the active medication list: Yes  Last refill:  08/23/22  Future visit scheduled: Yes  Notes to clinic:  Unable to refill per protocol, medication not assigned to the refill protocol. Unable to refill per protocol, cannot delegate.      Requested Prescriptions  Pending Prescriptions Disp Refills   nystatin-triamcinolone ointment (MYCOLOG) [Pharmacy Med Name: NYSTATIN/TRIAMCINOLONE OINT 30GM] 30 g 0    Sig: APPLY TOPICALLY TO THE AFFECTED AREA TWICE DAILY     Off-Protocol Failed - 11/20/2022  5:29 PM      Failed - Medication not assigned to a protocol, review manually.      Passed - Valid encounter within last 12 months    Recent Outpatient Visits           2 months ago Nasal sinus congestion   [place] [facility] at [facility], Nyoka Cowden, MD   8 months ago Annual physical exam   [facility] Medicine at [facility], Nyoka Cowden, MD   12 months ago Esophageal dysphagia   [place] [facility] Medicine at [facility], Nyoka Cowden, MD   1 year ago Acute non-recurrent maxillary sinusitis   [place] [facility] Medicine at [facility], Nyoka Cowden, MD   1 year ago Biceps tendinitis of both shoulders   [place] [facility] at [facility], Ocie Bob, MD       Future Appointments             Tomorrow Reubin Milan, MD [facility] Primary Care & Sports Medicine at [facility], [facility]   In 3 months Judithann Graves Nyoka Cowden, MD [facility] Primary Care & Sports Medicine at [facility] [place], [facility]             triamcinolone cream (KENALOG) 0.1 % [Pharmacy Med Name: TRIAMCINOLONE 0.1% CREAM  30GM] 30 g 5    Sig: APPLY TOPICALLY TO RASH ON FEET TWICE DAILY     Not Delegated - Dermatology:  Corticosteroids Failed - 11/20/2022  5:29 PM      Failed - This  refill cannot be delegated      Passed - Valid encounter within last 12 months    Recent Outpatient Visits           2 months ago Nasal sinus congestion   [place] [facility] Medicine at [facility], Nyoka Cowden, MD   8 months ago Annual physical exam   [facility] Primary Care & Sports Medicine at [facility], Nyoka Cowden, MD   12 months ago Esophageal dysphagia   [facility] Primary Care & Sports Medicine at [facility], Nyoka Cowden, MD   1 year ago Acute non-recurrent maxillary sinusitis   [place] [facility] Medicine at [facility], Nyoka Cowden, MD   1 year ago Biceps tendinitis of both shoulders   [facility] Primary Care [facility] Sports Medicine at [facility], Ocie Bob, MD       Future Appointments             Tomorrow Judithann Graves Nyoka Cowden, MD [facility] Health Primary Care & Sports Medicine at [facility], [facility]   In 3 months Judithann Graves, Nyoka Cowden, MD [facility] Health Primary Care & Sports Medicine at [facility], [facility]

## 2022-11-22 ENCOUNTER — Other Ambulatory Visit: Payer: Self-pay

## 2022-11-22 ENCOUNTER — Encounter: Payer: Self-pay | Admitting: Internal Medicine

## 2022-11-22 ENCOUNTER — Ambulatory Visit
Admission: RE | Admit: 2022-11-22 | Discharge: 2022-11-22 | Disposition: A | Discharge status: Home / Self Care | Payer: Commercial Managed Care - PPO | Attending: Internal Medicine | Admitting: Internal Medicine

## 2022-11-22 ENCOUNTER — Ambulatory Visit
Admission: RE | Admit: 2022-11-22 | Discharge: 2022-11-22 | Disposition: A | Discharge status: Home / Self Care | Payer: Commercial Managed Care - PPO | Source: Ambulatory Visit | Attending: Internal Medicine | Admitting: Internal Medicine

## 2022-11-22 ENCOUNTER — Ambulatory Visit (INDEPENDENT_AMBULATORY_CARE_PROVIDER_SITE_OTHER): Payer: Commercial Managed Care - PPO | Admitting: Internal Medicine

## 2022-11-22 VITALS — BP 132/86 | HR 78 | Ht 63.0 in | Wt 194.0 lb

## 2022-11-22 DIAGNOSIS — Z111 Encounter for screening for respiratory tuberculosis: Secondary | ICD-10-CM

## 2022-11-22 DIAGNOSIS — Z23 Encounter for immunization: Secondary | ICD-10-CM

## 2022-11-22 NOTE — Progress Notes (Signed)
Date:  11/22/2022   Name:  Theresa Lester   DOB:  January 24, 1970   MRN:  [id number]   Chief Complaint: TB Testing Needs TB testing for work.  She has been vaccinated for BCG and in 2022 had a positive Quantiferon-Gold test.  She was treated for latent TB.  Going forward, she will need to be screened with a CXR, not a skin test or blood test as these will likely remain positive for life.  HPI  Review of Systems  Constitutional:  Negative for chills, diaphoresis, fever and unexpected weight change.  Respiratory:  Negative for cough and chest tightness.   Cardiovascular:  Negative for chest pain.     Lab Results  Component Value Date   NA 139 02/28/2022   K 4.4 02/28/2022   CO2 22 02/28/2022   GLUCOSE 92 02/28/2022   BUN 12 02/28/2022   CREATININE 0.72 02/28/2022   CALCIUM 9.2 02/28/2022   EGFR 100 02/28/2022   GFRNONAA >60 01/19/2020   Lab Results  Component Value Date   CHOL 169 02/28/2022   HDL 56 02/28/2022   LDLCALC 92 02/28/2022   TRIG 118 02/28/2022   CHOLHDL 3.0 02/28/2022   Lab Results  Component Value Date   TSH 2.500 02/28/2022   Lab Results  Component Value Date   HGBA1C 6.2 (H) 02/28/2022   Lab Results  Component Value Date   WBC 11.3 (H) 02/28/2022   HGB 14.1 02/28/2022   HCT 43.1 02/28/2022   MCV 83 02/28/2022   PLT 321 02/28/2022   Lab Results  Component Value Date   ALT 33 (H) 02/28/2022   AST 25 02/28/2022   ALKPHOS 106 02/28/2022   BILITOT 0.3 02/28/2022   No results found for: "25OHVITD2", "25OHVITD3", "VD25OH"   Patient Active Problem List   Diagnosis Date Noted   Biceps tendinitis of both shoulders 03/29/2021   Rotator cuff impingement syndrome, right 01/25/2021   Cervical paraspinal muscle spasm 01/25/2021   Lateral epicondylitis, left elbow 04/13/2020   Tricompartment osteoarthritis of right knee 04/13/2020   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 02/03/2020   Mild persistent asthma without complication 01/19/2020   Benign  neoplasm of cecum    Immunization, BCG 03/01/2018   Tricompartment osteoarthritis of left knee 10/11/2016   Hemifacial spasm    TIA (transient ischemic attack) 06/11/2015   Episodic tension-type headache, not intractable 04/14/2015   Angioneurotic edema 10/19/2014   Calculus of gallbladder 10/19/2014   GERD (gastroesophageal reflux disease) 10/19/2014   Hemorrhoids, internal 10/19/2014   Restless leg syndrome 10/19/2014    No Known Allergies  Past Surgical History:  Procedure Laterality Date   BILATERAL CARPAL TUNNEL RELEASE     COLONOSCOPY WITH PROPOFOL N/A 11/01/2018   Procedure: COLONOSCOPY WITH BIOPSY;  Surgeon: Midge Minium, MD;  [facility];  Service: Endoscopy;  Laterality: N/A;   KNEE ARTHROSCOPY WITH MEDIAL MENISECTOMY Left 01/18/2017   Procedure: KNEE ARTHROSCOPY WITH MEDIAL MENISECTOMY ROOT REPAIR CHONDROPLASTY;  Surgeon: Signa Kell, MD;  Location: [facility];  Service: Orthopedics;  Laterality: Left;  SUPINE WITH ACL LEG HOLDER ARTHREX ALEX  FLIP CUTTER, MENISCUS ROOT GUIDE STRYKER [place] CETERIX AIR  ANESTHESIA: ADDUCTOR CANAL NERVE BLOCK   KNEE ARTHROSCOPY WITH MEDIAL MENISECTOMY Right 07/16/2018   Procedure: KNEE ARTHROSCOPY WITH MEDIAL MENISCUS  ROOT REPAIR;  Surgeon: Signa Kell, MD;  Location: [facility];  Service: Orthopedics;  Laterality: Right;  MENSICUS ROOT GUIDE KIT + SWIVELOCKS,  SWIVELOCK TIBIAL FIXATION CETERIX OPEN KNEE TRAY  BOVIE ELECTROCAUTERY   POLYPECTOMY N/A 11/01/2018   Procedure: POLYPECTOMY;  Surgeon: Midge Minium, MD;  Location: [facility];  Service: Endoscopy;  Laterality: N/A;   TUBAL LIGATION     VESTIBULAR NERVE SECTION Right 2017    Social History   Tobacco Use   Smoking status: Never   Smokeless tobacco: Never  Vaping Use   Vaping status: Never Used  Substance Use Topics   Alcohol use: Not Currently    Alcohol/week: 0.0 standard drinks of alcohol   Drug use: Never     Medication  list has been reviewed and updated.  Current Meds  Medication Sig   albuterol (VENTOLIN HFA) 108 (90 Base) MCG/ACT inhaler Inhale 2 puffs into the lungs every 6 (six) hours as needed for wheezing or shortness of breath.   Budesonide (PULMICORT FLEXHALER) 90 MCG/ACT inhaler Inhale 1 puff into the lungs 2 (two) times daily.   carbamazepine (TEGRETOL XR) 100 MG 12 hr tablet Take 100 mg by mouth daily as needed.   cyclobenzaprine (FLEXERIL) 10 MG tablet Take 1 tablet (10 mg total) by mouth 2 (two) times daily as needed for muscle spasms.   fexofenadine (ALLEGRA ALLERGY) 180 MG tablet Take 1 tablet (180 mg total) by mouth daily.   fluticasone (FLONASE) 50 MCG/ACT nasal spray Place 2 sprays into both nostrils daily.   gabapentin (NEURONTIN) 100 MG capsule TAKE 1 CAPSULE(100 MG) BY MOUTH EVERY EVENING   hydrocortisone (ANUSOL-HC) 2.5 % rectal cream Place 1 application rectally 2 (two) times daily.   meloxicam (MOBIC) 15 MG tablet Take 1 tablet (15 mg total) by mouth daily as needed for pain.   omeprazole (PRILOSEC) 40 MG capsule TAKE 1 CAPSULE(40 MG) BY MOUTH DAILY   [DISCONTINUED] nystatin-triamcinolone ointment (MYCOLOG) APPLY TOPICALLY TO THE AFFECTED AREA TWICE DAILY   [DISCONTINUED] triamcinolone cream (KENALOG) 0.1 % Apply 1 application topically 2 (two) times daily. To rash on feet       11/22/2022    8:12 AM 08/29/2022    1:27 PM 02/28/2022    9:47 AM 11/23/2021    3:30 PM  GAD 7 : Generalized Anxiety Score  Nervous, Anxious, on Edge 0 0 0 0  Control/stop worrying 0 0 0 0  Worry too much - different things 0 0 0 0  Trouble relaxing 0 0 0 0  Restless 0 0 1 0  Easily annoyed or irritable 0 0 0 0  Afraid - awful might happen 0 0 1 0  Total GAD 7 Score 0 0 2 0  Anxiety Difficulty Not difficult at all Not difficult at all Not difficult at all Not difficult at all       11/22/2022    8:12 AM 08/29/2022    1:27 PM 02/28/2022    9:47 AM  Depression screen PHQ 2/9  Decreased Interest 0 0 0   Down, Depressed, Hopeless 0 0 0  PHQ - 2 Score 0 0 0  Altered sleeping 0 0 0  Tired, decreased energy 0 0 1  Change in appetite 0 0 1  Feeling bad or failure about yourself  0 0 0  Trouble concentrating 0 0 0  Moving slowly or fidgety/restless 0 0 1  Suicidal thoughts 0 0 0  PHQ-9 Score 0 0 3  Difficult doing work/chores Not difficult at all Not difficult at all Not difficult at all    BP Readings from Last 3 Encounters:  11/22/22 132/86  08/29/22 118/70  02/28/22 124/70    Physical Exam  Vitals and nursing note reviewed.  Constitutional:      General: She is not in acute distress.    Appearance: She is well-developed.  HENT:     Head: Normocephalic and atraumatic.  Cardiovascular:     Rate and Rhythm: Normal rate and regular rhythm.  Pulmonary:     Effort: Pulmonary effort is normal. No respiratory distress.     Breath sounds: No wheezing or rhonchi.  Musculoskeletal:     Cervical back: Normal range of motion.  Lymphadenopathy:     Cervical: No cervical adenopathy.  Skin:    General: Skin is warm and dry.     Findings: No rash.  Neurological:     Mental Status: She is alert and oriented to person, place, and time.  Psychiatric:        Mood and Affect: Mood normal.        Behavior: Behavior normal.     Wt Readings from Last 3 Encounters:  11/22/22 194 lb (88 kg)  08/29/22 200 lb (90.7 kg)  02/28/22 196 lb 12.8 oz (89.3 kg)    BP 132/86   Pulse 78   Ht 5\' 3"  (1.6 m)   Wt 194 lb (88 kg)   SpO2 98%   BMI 34.37 kg/m   Assessment and Plan:  Problem List Items Addressed This Visit   None Visit Diagnoses     Screening-pulmonary TB    -  Primary   Relevant Orders   DG Chest 2 View   Need for influenza vaccination       Relevant Orders   Flu vaccine trivalent PF, 6mos and older(Flulaval,Afluria,Fluarix,Fluzone) (Completed)       No follow-ups on file.    Reubin Milan, MD [facility] Primary Care and Sports Medicine [place]

## 2022-12-04 ENCOUNTER — Encounter: Payer: Self-pay | Admitting: *Deleted

## 2022-12-11 ENCOUNTER — Telehealth: Payer: Self-pay | Admitting: Internal Medicine

## 2022-12-11 NOTE — Telephone Encounter (Signed)
Called patient and gave # for her to call and schedule mammo.  - Drew Lips

## 2022-12-11 NOTE — Telephone Encounter (Signed)
Copied from CRM [id number]. Topic: Referral - Status >> Dec 11, 2022  1:35 PM Macon Large wrote: Reason for CRM: Pt requests referral for mammogram. Cb# [phone]

## 2022-12-21 ENCOUNTER — Ambulatory Visit: Payer: Commercial Managed Care - PPO | Admitting: Family Medicine

## 2022-12-21 ENCOUNTER — Encounter: Payer: Self-pay | Admitting: Family Medicine

## 2022-12-21 VITALS — BP 124/82 | HR 101 | Ht 63.0 in | Wt 192.2 lb

## 2022-12-21 DIAGNOSIS — M7521 Bicipital tendinitis, right shoulder: Secondary | ICD-10-CM | POA: Diagnosis not present

## 2022-12-21 DIAGNOSIS — M7541 Impingement syndrome of right shoulder: Secondary | ICD-10-CM | POA: Diagnosis not present

## 2022-12-21 MED ORDER — DICLOFENAC SODIUM 50 MG PO TBEC: 50.0000 mg | DELAYED_RELEASE_TABLET | Freq: Two times a day (BID) | ORAL | 0 refills | Status: DC

## 2022-12-21 MED ORDER — METHOCARBAMOL 500 MG PO TABS
500.0000 mg | ORAL_TABLET | Freq: Three times a day (TID) | ORAL | 0 refills | Status: DC | PRN
Start: 2022-12-21 — End: 2023-06-05

## 2022-12-21 NOTE — Progress Notes (Signed)
Primary Care / Sports Medicine Office Visit  Patient Information:  Patient ID: Theresa Lester, female DOB: 10-Apr-1970 Age: 52 y.o. MRN: [id number]   Theresa Lester is a pleasant 52 y.o. female presenting with the following:  Chief Complaint  Patient presents with   Arm Pain    Patient presents today for right arm pain. Her arm has been bothering her since last week after she lifted a patient at work. She has been having numbness in entire arm, difficulty combing her hair. She has sharpe pain in the shoulder occasionally.    Vitals:   12/21/22 0909  BP: 124/82  Pulse: (!) 101  SpO2: 98%   Vitals:   12/21/22 0909  Weight: 192 lb 3.2 oz (87.2 kg)  Height: 5\' 3"  (1.6 m)   Body mass index is 34.05 kg/m.     Independent interpretation of notes and tests performed by another provider:   None  Procedures performed:   None  Pertinent History, Exam, Impression, and Recommendations:   Problem List Items Addressed This Visit       Musculoskeletal and Integument   Biceps tendinitis of both shoulders    See additional assessment(s) for plan details.      Rotator cuff impingement syndrome, right - Primary    History of Present Illness The patient, a Research scientist (physical sciences), presents with right arm pain and numbness that started a week ago after lifting a patient at work. The discomfort has made it difficult for them to perform tasks such as combing their hair. They also report numbness in the entire arm and wrist. The patient has a history of similar symptoms, which were last addressed in July of the previous year. The patient has been managing the pain with ibuprofen, taken twice daily, which provides relief for about eight hours.  Results RADIOLOGY Right shoulder X-ray: Degenerative changes (2018) Neck X-ray: Degenerative changes (2018)  Physical Exam MUSCULOSKELETAL: Right shoulder negative empty can test, equivocal pain with empty can test, 5/5 strength  with isolated supraspinatus testing, painless with internal rotation, non-tender subacromial and bicipital groove, positive Speed's test, positive Yergason's test. Resolving rash around right elbow. NEUROLOGICAL: Negative Spurling test bilaterally, paraspinal muscular tightness and tenderness with muscular spasm in right upper trapezius, right rhomboids, left rhomboids, and left upper trapezius.  Assessment & Plan Right Arm Pain and Numbness New onset following lifting a patient at work. Previous similar episodes resolved. Positive Speed's and Yergason's tests suggesting biceps tendon involvement. Paraspinal muscular tightness and tenderness with muscular spasms in the right upper trapezius, right and left rhomboids, and left upper trapezius. No arm tingles with Spurling's test. -Recommend light duty at work for 2 weeks. -Prescribe Diclofenac to be taken twice daily with food. -Prescribe Methocarbamol (Robaxin) as a muscle relaxer to be taken as needed, primarily at night due to sedation side effect. -Increase Gabapentin to 300mg  at night for nerve pain. -Use of arm sleeve as needed for comfort. -Check in at 2-6 weeks to assess progress. If no improvement, consider updated x-ray and possible MRI of the neck.      Relevant Medications   diclofenac (VOLTAREN) 50 MG EC tablet   methocarbamol (ROBAXIN) 500 MG tablet     Orders & Medications Medications:  Meds ordered this encounter  Medications   diclofenac (VOLTAREN) 50 MG EC tablet    Sig: Take 1 tablet (50 mg total) by mouth 2 (two) times daily. X 2 weeks then twice daily as-needed    Dispense:  60 tablet    Refill:  0   methocarbamol (ROBAXIN) 500 MG tablet    Sig: Take 1 tablet (500 mg total) by mouth every 8 (eight) hours as needed for muscle spasms.    Dispense:  30 tablet    Refill:  0   No orders of the defined types were placed in this encounter.    No follow-ups on file.     Jerrol Banana, MD, [facility]   Primary Care  Sports Medicine Primary Care and Sports Medicine at [facility]

## 2022-12-21 NOTE — Assessment & Plan Note (Signed)
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 The discomfort has made it difficult for them to perform tasks such as combing their hair. They also report numbness in the entire arm and wrist. The patient has a history of similar symptoms, which were last addressed in July of the previous year. The patient has been managing the pain with ibuprofen, taken twice daily, which provides relief for about eight hours.  Results RADIOLOGY Right shoulder X-ray: Degenerative changes (2018) Neck X-ray: Degenerative changes (2018)  Physical Exam MUSCULOSKELETAL: Right shoulder negative empty can test, equivocal pain with empty can test, 5/5 strength with isolated supraspinatus testing, painless with internal rotation, non-tender subacromial and bicipital groove, positive Speed's test, positive Yergason's test. Resolving rash around right elbow. NEUROLOGICAL: Negative Spurling test bilaterally, paraspinal muscular tightness and tenderness with muscular spasm in right upper trapezius, right rhomboids, left rhomboids, and left upper trapezius.  Assessment & Plan Right Arm Pain and Numbness New onset following lifting a patient at work. Previous similar episodes resolved. Positive Speed's and Yergason's tests suggesting biceps tendon involvement. Paraspinal muscular tightness and tenderness with muscular spasms in the right upper trapezius, right and left rhomboids, and left upper trapezius. No arm tingles with Spurling's test. -Recommend light duty at work for 2 weeks. -Prescribe Diclofenac to be taken twice daily with food. -Prescribe Methocarbamol (Robaxin) as a muscle relaxer to be taken as needed, primarily at night due to sedation side effect. -Increase Gabapentin to 300mg  at night for nerve pain. -Use of arm sleeve as needed for comfort. -Check in at 2-6 weeks to assess progress. If no  improvement, consider updated x-ray and possible MRI of the neck.

## 2022-12-21 NOTE — Assessment & Plan Note (Signed)
See additional assessment(s) for plan details. 

## 2023-01-04 ENCOUNTER — Ambulatory Visit
Admission: RE | Admit: 2023-01-04 | Discharge: 2023-01-04 | Disposition: A | Discharge status: Home / Self Care | Payer: Commercial Managed Care - PPO | Source: Ambulatory Visit | Attending: Internal Medicine | Admitting: Internal Medicine

## 2023-01-04 DIAGNOSIS — Z1231 Encounter for screening mammogram for malignant neoplasm of breast: Secondary | ICD-10-CM | POA: Diagnosis present

## 2023-01-27 ENCOUNTER — Encounter: Payer: Self-pay | Admitting: Family Medicine

## 2023-01-28 ENCOUNTER — Other Ambulatory Visit: Payer: Self-pay | Admitting: Family Medicine

## 2023-01-28 DIAGNOSIS — M7541 Impingement syndrome of right shoulder: Secondary | ICD-10-CM

## 2023-01-29 NOTE — Telephone Encounter (Signed)
 Please review. Ok to refill?  KP

## 2023-01-30 ENCOUNTER — Encounter: Payer: Self-pay | Admitting: Family Medicine

## 2023-01-30 NOTE — Telephone Encounter (Signed)
 Requested Prescriptions  Pending Prescriptions Disp Refills   diclofenac  (VOLTAREN ) 50 MG EC tablet [Pharmacy Med Name: DICLOFENAC  SODIUM 50MG  DR TABLETS] 60 tablet 0    Sig: TAKE 1 TABLET(50 MG) BY MOUTH TWICE DAILY FOR 2 WEEKS, THEN TWICE DAILY AS NEEDED     Analgesics:  NSAIDS Failed - 01/30/2023 11:50 AM      Failed - Manual Review: Labs are only required if the patient has taken medication for more than 8 weeks.      Passed - Cr in normal range and within 360 days    Creatinine  Date Value Ref Range Status  02/17/2015 0.7 0.6 - 1.1 mg/dL Final   Creatinine, Ser  Date Value Ref Range Status  02/28/2022 0.72 0.57 - 1.00 mg/dL Final         Passed - HGB in normal range and within 360 days    Hemoglobin  Date Value Ref Range Status  02/28/2022 14.1 11.1 - 15.9 g/dL Final         Passed - PLT in normal range and within 360 days    Platelets  Date Value Ref Range Status  02/28/2022 321 150 - 450 x10E3/uL Final         Passed - HCT in normal range and within 360 days    Hematocrit  Date Value Ref Range Status  02/28/2022 43.1 34.0 - 46.6 % Final         Passed - eGFR is 30 or above and within 360 days    GFR calc Af Amer  Date Value Ref Range Status  09/13/2018 124 >59 mL/min/1.73 Final   GFR, Estimated  Date Value Ref Range Status  01/19/2020 >60 >60 mL/min Final    Comment:    (NOTE) Calculated using the CKD-EPI Creatinine Equation (2021)    eGFR  Date Value Ref Range Status  02/28/2022 100 >59 mL/min/1.73 Final         Passed - Patient is not pregnant      Passed - Valid encounter within last 12 months    Recent Outpatient Visits           1 month ago Rotator cuff impingement syndrome, right   [place] [facility] at [facility] [place] Alvia, Selinda PARAS, MD   2 months ago Screening-pulmonary TB   [facility] Medicine at [facility], Leita DEL, MD   5 months ago Nasal sinus congestion   [facility] & Sports Medicine at [facility], Leita DEL, MD   11 months ago Annual physical exam   [facility] Primary Care & Sports Medicine at [facility], Leita DEL, MD   1 year ago Esophageal dysphagia   [facility] Primary Care & Sports Medicine at [facility], Leita DEL, MD       Future Appointments             In 1 month Justus, Leita DEL, MD [facility] Primary Care & Sports Medicine at [facility], [facility]

## 2023-02-01 ENCOUNTER — Other Ambulatory Visit: Payer: Self-pay

## 2023-02-01 DIAGNOSIS — G2581 Restless legs syndrome: Secondary | ICD-10-CM

## 2023-02-01 MED ORDER — GABAPENTIN 100 MG PO CAPS
200.0000 mg | ORAL_CAPSULE | Freq: Every day | ORAL | 0 refills | Status: DC
Start: 2023-02-01 — End: 2023-06-05

## 2023-02-01 NOTE — Telephone Encounter (Signed)
Please review.  KP

## 2023-03-07 ENCOUNTER — Other Ambulatory Visit: Payer: Self-pay | Admitting: Internal Medicine

## 2023-03-07 DIAGNOSIS — B356 Tinea cruris: Secondary | ICD-10-CM

## 2023-03-07 NOTE — Telephone Encounter (Signed)
 Requested medication (s) are due for refill today: yes  Requested medication (s) are on the active medication list: yes  Last refill:  11/22/22 30 grams  Future visit scheduled: yes  Notes to clinic:  med not assigned to a protocol   Requested Prescriptions  Pending Prescriptions Disp Refills   nystatin-triamcinolone ointment (MYCOLOG) [Pharmacy Med Name: NYSTATIN/TRIAMCINOLONE OINT 30GM] 30 g 0    Sig: APPLY TOPICALLY TO THE AFFECTED AREA TWICE DAILY     Off-Protocol Failed - 03/07/2023  4:26 PM      Failed - Medication not assigned to a protocol, review manually.      Passed - Valid encounter within last 12 months    Recent Outpatient Visits           2 months ago Rotator cuff impingement syndrome, right   [place] [facility] at [facility], Ocie Bob, MD   3 months ago Screening-pulmonary TB   [facility] Care & Sports Medicine at [facility], Nyoka Cowden, MD   6 months ago Nasal sinus congestion   [facility] Primary Care & Sports Medicine at [facility], Nyoka Cowden, MD   1 year ago Annual physical exam   [facility] Primary Care & Sports Medicine at [facility], Nyoka Cowden, MD   1 year ago Esophageal dysphagia   [facility] Primary Care & Sports Medicine at [facility], Nyoka Cowden, MD       Future Appointments             In 2 weeks Judithann Graves Nyoka Cowden, MD [facility] Primary Care & Sports Medicine at [facility], [facility]   In 3 months Judithann Graves, Nyoka Cowden, MD [facility] Primary Care & Sports Medicine at [facility], [facility]   In 6 months [place], Salvadore Oxford, NP [facility], [facility]

## 2023-03-15 ENCOUNTER — Encounter: Payer: Self-pay | Admitting: Internal Medicine

## 2023-03-26 ENCOUNTER — Ambulatory Visit: Payer: Commercial Managed Care - PPO | Admitting: Internal Medicine

## 2023-03-26 ENCOUNTER — Encounter: Payer: Self-pay | Admitting: Internal Medicine

## 2023-03-26 VITALS — BP 130/86 | HR 76 | Ht 63.0 in | Wt 191.1 lb

## 2023-03-26 DIAGNOSIS — L309 Dermatitis, unspecified: Secondary | ICD-10-CM

## 2023-03-26 DIAGNOSIS — B353 Tinea pedis: Secondary | ICD-10-CM

## 2023-03-26 MED ORDER — FLUCONAZOLE 150 MG PO TABS: 150.0000 mg | ORAL_TABLET | ORAL | 0 refills | Status: AC

## 2023-03-26 MED ORDER — CEPHALEXIN 500 MG PO CAPS: 500.0000 mg | ORAL_CAPSULE | Freq: Four times a day (QID) | ORAL | 0 refills | Status: AC

## 2023-03-26 MED ORDER — TRIAMCINOLONE ACETONIDE 0.5 % EX OINT: 1.0000 | TOPICAL_OINTMENT | Freq: Two times a day (BID) | CUTANEOUS | 0 refills | Status: DC

## 2023-03-26 NOTE — Progress Notes (Signed)
 Date:  03/26/2023   Name:  Theresa Lester   DOB:  1970-08-06   MRN:  [id number]   Chief Complaint: Rash (Patient said she has skin dryness on her hands and feet, toe nails turned black, hand and foot pain, rash has moved up to private area )  Rash This is a recurrent problem. The affected locations include the right foot, left foot, left hand and right hand. The rash is characterized by peeling, redness and dryness (and cracking). Pertinent negatives include no fatigue or shortness of breath.    Review of Systems  Constitutional:  Negative for chills and fatigue.  Respiratory:  Negative for chest tightness and shortness of breath.   Cardiovascular:  Negative for chest pain.  Skin:  Positive for rash.  Psychiatric/Behavioral:  Negative for sleep disturbance.      Lab Results  Component Value Date   NA 139 02/28/2022   K 4.4 02/28/2022   CO2 22 02/28/2022   GLUCOSE 92 02/28/2022   BUN 12 02/28/2022   CREATININE 0.72 02/28/2022   CALCIUM 9.2 02/28/2022   EGFR 100 02/28/2022   GFRNONAA >60 01/19/2020   Lab Results  Component Value Date   CHOL 169 02/28/2022   HDL 56 02/28/2022   LDLCALC 92 02/28/2022   TRIG 118 02/28/2022   CHOLHDL 3.0 02/28/2022   Lab Results  Component Value Date   TSH 2.500 02/28/2022   Lab Results  Component Value Date   HGBA1C 6.2 (H) 02/28/2022   Lab Results  Component Value Date   WBC 11.3 (H) 02/28/2022   HGB 14.1 02/28/2022   HCT 43.1 02/28/2022   MCV 83 02/28/2022   PLT 321 02/28/2022   Lab Results  Component Value Date   ALT 33 (H) 02/28/2022   AST 25 02/28/2022   ALKPHOS 106 02/28/2022   BILITOT 0.3 02/28/2022   No results found for: "25OHVITD2", "25OHVITD3", "VD25OH"   Patient Active Problem List   Diagnosis Date Noted   Biceps tendinitis of both shoulders 03/29/2021   Rotator cuff impingement syndrome, right 01/25/2021   Cervical paraspinal muscle spasm 01/25/2021   Lateral epicondylitis, left elbow 04/13/2020    Tricompartment osteoarthritis of right knee 04/13/2020   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 02/03/2020   Mild persistent asthma without complication 01/19/2020   Benign neoplasm of cecum    Immunization, BCG 03/01/2018   Tricompartment osteoarthritis of left knee 10/11/2016   Hemifacial spasm    TIA (transient ischemic attack) 06/11/2015   Episodic tension-type headache, not intractable 04/14/2015   Angioneurotic edema 10/19/2014   Calculus of gallbladder 10/19/2014   GERD (gastroesophageal reflux disease) 10/19/2014   Hemorrhoids, internal 10/19/2014   Restless leg syndrome 10/19/2014    No Known Allergies  Past Surgical History:  Procedure Laterality Date   BILATERAL CARPAL TUNNEL RELEASE     COLONOSCOPY WITH PROPOFOL N/A 11/01/2018   Procedure: COLONOSCOPY WITH BIOPSY;  Surgeon: Midge Minium, MD;  Location: [facility];  Service: Endoscopy;  Laterality: N/A;   KNEE ARTHROSCOPY WITH MEDIAL MENISECTOMY Left 01/18/2017   Procedure: KNEE ARTHROSCOPY WITH MEDIAL MENISECTOMY ROOT REPAIR CHONDROPLASTY;  Surgeon: Signa Kell, MD;  Location: [facility];  Service: Orthopedics;  Laterality: Left;  SUPINE WITH ACL LEG HOLDER ARTHREX ALEX  FLIP CUTTER, MENISCUS ROOT GUIDE STRYKER [place] CETERIX AIR  ANESTHESIA: ADDUCTOR CANAL NERVE BLOCK   KNEE ARTHROSCOPY WITH MEDIAL MENISECTOMY Right 07/16/2018   Procedure: KNEE ARTHROSCOPY WITH MEDIAL MENISCUS  ROOT REPAIR;  Surgeon: Signa Kell, MD;  Location: [facility];  Service: Orthopedics;  Laterality: Right;  MENSICUS ROOT GUIDE KIT + SWIVELOCKS,  SWIVELOCK TIBIAL FIXATION CETERIX OPEN KNEE TRAY BOVIE ELECTROCAUTERY   POLYPECTOMY N/A 11/01/2018   Procedure: POLYPECTOMY;  Surgeon: Midge Minium, MD;  Location: [facility];  Service: Endoscopy;  Laterality: N/A;   TUBAL LIGATION     VESTIBULAR NERVE SECTION Right 2017    Social History   Tobacco Use   Smoking status: Never   Smokeless tobacco: Never  Vaping Use    Vaping status: Never Used  Substance Use Topics   Alcohol use: Not Currently    Alcohol/week: 0.0 standard drinks of alcohol   Drug use: Never     Medication list has been reviewed and updated.  Current Meds  Medication Sig   albuterol (VENTOLIN HFA) 108 (90 Base) MCG/ACT inhaler Inhale 2 puffs into the lungs every 6 (six) hours as needed for wheezing or shortness of breath.   Budesonide (PULMICORT FLEXHALER) 90 MCG/ACT inhaler Inhale 1 puff into the lungs 2 (two) times daily.   carbamazepine (TEGRETOL XR) 100 MG 12 hr tablet Take 100 mg by mouth daily as needed.   cephALEXin (KEFLEX) 500 MG capsule Take 1 capsule (500 mg total) by mouth 4 (four) times daily for 7 days.   diclofenac (VOLTAREN) 50 MG EC tablet TAKE 1 TABLET(50 MG) BY MOUTH TWICE DAILY FOR 2 WEEKS, THEN TWICE DAILY AS NEEDED   fluconazole (DIFLUCAN) 150 MG tablet Take 1 tablet (150 mg total) by mouth once a week for 6 doses.   gabapentin (NEURONTIN) 100 MG capsule Take 2 capsules (200 mg total) by mouth daily.   hydrocortisone (ANUSOL-HC) 2.5 % rectal cream Place 1 application rectally 2 (two) times daily.   methocarbamol (ROBAXIN) 500 MG tablet Take 1 tablet (500 mg total) by mouth every 8 (eight) hours as needed for muscle spasms.   nystatin-triamcinolone ointment (MYCOLOG) APPLY TOPICALLY TO THE AFFECTED AREA TWICE DAILY   omeprazole (PRILOSEC) 40 MG capsule TAKE 1 CAPSULE(40 MG) BY MOUTH DAILY   triamcinolone ointment (KENALOG) 0.5 % Apply 1 Application topically 2 (two) times daily. To rash on hands   [DISCONTINUED] triamcinolone cream (KENALOG) 0.1 % APPLY TOPICALLY TO RASH ON FEET TWICE DAILY       03/26/2023    3:58 PM 12/21/2022    9:13 AM 11/22/2022    8:12 AM 08/29/2022    1:27 PM  GAD 7 : Generalized Anxiety Score  Nervous, Anxious, on Edge 0 0 0 0  Control/stop worrying 0 0 0 0  Worry too much - different things 0 0 0 0  Trouble relaxing 0 0 0 0  Restless 0 0 0 0  Easily annoyed or irritable 0 0 0 0   Afraid - awful might happen 0 0 0 0  Total GAD 7 Score 0 0 0 0  Anxiety Difficulty Not difficult at all Not difficult at all Not difficult at all Not difficult at all       03/26/2023    3:58 PM 12/21/2022    9:13 AM 11/22/2022    8:12 AM  Depression screen PHQ 2/9  Decreased Interest 0 0 0  Down, Depressed, Hopeless 0 0 0  PHQ - 2 Score 0 0 0  Altered sleeping 0 0 0  Tired, decreased energy 0 0 0  Change in appetite 0 0 0  Feeling bad or failure about yourself  0 0 0  Trouble concentrating 0 0 0  Moving slowly or  fidgety/restless 0 0 0  Suicidal thoughts 0 0 0  PHQ-9 Score 0 0 0  Difficult doing work/chores Not difficult at all Not difficult at all Not difficult at all    BP Readings from Last 3 Encounters:  03/26/23 130/86  12/21/22 124/82  11/22/22 132/86    Physical Exam Vitals and nursing note reviewed.  Constitutional:      General: She is not in acute distress.    Appearance: She is well-developed.  HENT:     Head: Normocephalic and atraumatic.  Cardiovascular:     Rate and Rhythm: Normal rate and regular rhythm.     Heart sounds: No murmur heard. Pulmonary:     Effort: Pulmonary effort is normal. No respiratory distress.     Breath sounds: No wheezing or rhonchi.  Musculoskeletal:     Cervical back: Normal range of motion and neck supple.  Skin:    General: Skin is warm and dry.     Capillary Refill: Capillary refill takes less than 2 seconds.     Findings: No rash.  Neurological:     General: No focal deficit present.     Mental Status: She is alert and oriented to person, place, and time.  Psychiatric:        Mood and Affect: Mood normal.        Behavior: Behavior normal.     Wt Readings from Last 3 Encounters:  03/26/23 191 lb 2 oz (86.7 kg)  12/21/22 192 lb 3.2 oz (87.2 kg)  11/22/22 194 lb (88 kg)    BP 130/86   Pulse 76   Ht 5\' 3"  (1.6 m)   Wt 191 lb 2 oz (86.7 kg)   SpO2 98%   BMI 33.86 kg/m   Assessment and Plan:  Problem List  Items Addressed This Visit   None Visit Diagnoses       Tinea pedis of both feet    -  Primary   extensive plaque and cracking with concern for bacterial infection diflucan 150 mg weekly x 6 keflex   Relevant Medications   fluconazole (DIFLUCAN) 150 MG tablet   cephALEXin (KEFLEX) 500 MG capsule     Hand eczema       Kenalog ointment bid cotton gloves while sleeping   Relevant Medications   triamcinolone ointment (KENALOG) 0.5 %       Return in about 1 month (around 04/26/2023) for rash.    Reubin Milan, MD [facility] [place]

## 2023-03-26 NOTE — Patient Instructions (Signed)
 Use the ointment on hands twice a day - at bedtime, apply ointment and wear white cotton gloves

## 2023-04-05 ENCOUNTER — Other Ambulatory Visit: Payer: Self-pay | Admitting: Internal Medicine

## 2023-04-05 DIAGNOSIS — L309 Dermatitis, unspecified: Secondary | ICD-10-CM

## 2023-04-05 DIAGNOSIS — R21 Rash and other nonspecific skin eruption: Secondary | ICD-10-CM

## 2023-04-06 NOTE — Telephone Encounter (Signed)
 Requested medication (s) are due for refill today: yes  Requested medication (s) are on the active medication list: yes  Last refill:  03/26/23 #30g/0  Future visit scheduled: yes  Notes to clinic:  Unable to refill per protocol, cannot delegate.    Requested Prescriptions  Pending Prescriptions Disp Refills   triamcinolone cream (KENALOG) 0.1 % [Pharmacy Med Name: TRIAMCINOLONE 0.1% CREAM  30GM] 30 g 5    Sig: APPLY TOPICALLY TO RASH ON FEET TWICE DAILY     Not Delegated - Dermatology:  Corticosteroids Failed - 04/06/2023  2:53 PM      Failed - This refill cannot be delegated      Passed - Valid encounter within last 12 months    Recent Outpatient Visits           3 months ago Rotator cuff impingement syndrome, right   [place] [facility], Ocie Bob, MD   4 months ago Screening-pulmonary TB   [facility] at [facility], Nyoka Cowden, MD   7 months ago Nasal sinus congestion   [place] [facility], Nyoka Cowden, MD   1 year ago Annual physical exam   [facility] at [facility], Nyoka Cowden, MD   1 year ago Esophageal dysphagia   [place] [facility] at [facility], Nyoka Cowden, MD       Future Appointments             In 3 weeks Judithann Graves Nyoka Cowden, MD [facility] at [facility], [facility]   In 2 months Judithann Graves Nyoka Cowden, MD [facility] Primary [facility] & Sports Medicine at [facility], [facility]   In 5 months Sampson Si, Salvadore Oxford, NP [facility], [facility]             triamcinolone ointment (KENALOG) 0.5 % [Pharmacy Med Name: TRIAMCINOLONE 0.5% OINT 15GM] 30 g 0    Sig: APPLY TWICE DAILY TO RASH ON HANDS     Not Delegated - Dermatology:  Corticosteroids Failed - 04/06/2023  2:53 PM      Failed - This refill cannot be  delegated      Passed - Valid encounter within last 12 months    Recent Outpatient Visits           3 months ago Rotator cuff impingement syndrome, right   [place] [facility] at [facility], Ocie Bob, MD   4 months ago Screening-pulmonary TB   [facility] at [facility], Nyoka Cowden, MD   7 months ago Nasal sinus congestion   [place] [facility] at [facility], Nyoka Cowden, MD   1 year ago Annual physical exam   [facility] at [facility], Nyoka Cowden, MD   1 year ago Esophageal dysphagia   [facility] at [facility], Nyoka Cowden, MD       Future Appointments             In 3 weeks Judithann Graves Nyoka Cowden, MD [facility] Primary [facility] & Sports Medicine at [facility], [facility]   In 2 months Judithann Graves, Nyoka Cowden, MD [facility] Primary Care & Sports Medicine  at [employer], [facility]   In 5 months Baity, Salvadore Oxford, NP [facility], [place]

## 2023-04-07 ENCOUNTER — Other Ambulatory Visit: Payer: Self-pay | Admitting: Internal Medicine

## 2023-04-07 DIAGNOSIS — M7541 Impingement syndrome of right shoulder: Secondary | ICD-10-CM

## 2023-04-09 NOTE — Telephone Encounter (Signed)
 Requested medications are due for refill today.  yes  Requested medications are on the active medications list.  yes  Last refill. 01/30/2023 #60 1 rf  Future visit scheduled.   yes  Notes to clinic.  Labs are expired.    Requested Prescriptions  Pending Prescriptions Disp Refills   diclofenac (VOLTAREN) 50 MG EC tablet [Pharmacy Med Name: DICLOFENAC SODIUM 50MG  DR TABLETS] 60 tablet 1    Sig: TAKE 1 TABLET(50 MG) BY MOUTH TWICE DAILY FOR 2 WEEKS AS NEEDED     Analgesics:  NSAIDS Failed - 04/09/2023  2:55 PM      Failed - Manual Review: Labs are only required if the patient has taken medication for more than 8 weeks.      Failed - Cr in normal range and within 360 days    Creatinine  Date Value Ref Range Status  02/17/2015 0.7 0.6 - 1.1 mg/dL Final   Creatinine, Ser  Date Value Ref Range Status  02/28/2022 0.72 0.57 - 1.00 mg/dL Final         Failed - HGB in normal range and within 360 days    Hemoglobin  Date Value Ref Range Status  02/28/2022 14.1 11.1 - 15.9 g/dL Final         Failed - PLT in normal range and within 360 days    Platelets  Date Value Ref Range Status  02/28/2022 321 150 - 450 x10E3/uL Final         Failed - HCT in normal range and within 360 days    Hematocrit  Date Value Ref Range Status  02/28/2022 43.1 34.0 - 46.6 % Final         Failed - eGFR is 30 or above and within 360 days    GFR calc Af Amer  Date Value Ref Range Status  09/13/2018 124 >59 mL/min/1.73 Final   GFR, Estimated  Date Value Ref Range Status  01/19/2020 >60 >60 mL/min Final    Comment:    (NOTE) Calculated using the CKD-EPI Creatinine Equation (2021)    eGFR  Date Value Ref Range Status  02/28/2022 100 >59 mL/min/1.73 Final         Passed - Patient is not pregnant      Passed - Valid encounter within last 12 months    Recent Outpatient Visits           3 months ago Rotator cuff impingement syndrome, right   [place] [facility] at  [facility], Ocie Bob, MD   4 months ago Screening-pulmonary TB   [facility] at [facility], Nyoka Cowden, MD   7 months ago Nasal sinus congestion   [place] [facility] at [facility], Nyoka Cowden, MD   1 year ago Annual physical exam   [facility] at [facility], Nyoka Cowden, MD   1 year ago Esophageal dysphagia   [facility] Medicine at [facility], Nyoka Cowden, MD       Future Appointments             In 2 weeks Judithann Graves, Nyoka Cowden, MD [facility] Medicine at [facility], [facility]   In 1 month Judithann Graves, Nyoka Cowden, MD [facility] Primary [facility] & Sports Medicine at [facility], [facility]   In 5 months [place], Salvadore Oxford, NP [facility]  [facility], [facility]

## 2023-04-09 NOTE — Telephone Encounter (Signed)
 Med refill

## 2023-04-22 ENCOUNTER — Other Ambulatory Visit: Payer: Self-pay | Admitting: Internal Medicine

## 2023-04-22 DIAGNOSIS — B356 Tinea cruris: Secondary | ICD-10-CM

## 2023-04-24 NOTE — Telephone Encounter (Signed)
 Requested medication (s) are due for refill today: yes  Requested medication (s) are on the active medication list: yes  Last refill:  03/08/23 30 grams   Future visit scheduled: yes  Notes to clinic:  med not assigned to a protocol   Requested Prescriptions  Pending Prescriptions Disp Refills   nystatin-triamcinolone ointment (MYCOLOG) [Pharmacy Med Name: NYSTATIN/TRIAMCINOLONE OINT 30GM] 30 g 0    Sig: APPLY TOPICALLY TO THE AFFECTED AREA TWICE DAILY     Off-Protocol Failed - 04/24/2023  9:03 AM      Failed - Medication not assigned to a protocol, review manually.      Passed - Valid encounter within last 12 months    Recent Outpatient Visits           4 weeks ago Tinea pedis of both feet   [place] [facility] at [facility], Nyoka Cowden, MD       Future Appointments             In 3 days Judithann Graves Nyoka Cowden, MD [facility] Primary Care & Sports Medicine at [facility], [facility]   In 1 month Judithann Graves, Nyoka Cowden, MD [facility] Primary Care & Sports Medicine at [facility], [facility]   In 4 months [place], Salvadore Oxford, NP [facility], [facility]

## 2023-04-27 ENCOUNTER — Encounter: Payer: Self-pay | Admitting: Internal Medicine

## 2023-04-27 ENCOUNTER — Ambulatory Visit: Admitting: Internal Medicine

## 2023-04-27 VITALS — BP 140/96 | HR 85 | Ht 63.0 in | Wt 189.3 lb

## 2023-04-27 DIAGNOSIS — B353 Tinea pedis: Secondary | ICD-10-CM | POA: Diagnosis not present

## 2023-04-27 DIAGNOSIS — L2084 Intrinsic (allergic) eczema: Secondary | ICD-10-CM | POA: Diagnosis not present

## 2023-04-27 MED ORDER — NYSTATIN-TRIAMCINOLONE 100000-0.1 UNIT/GM-% EX OINT: TOPICAL_OINTMENT | Freq: Two times a day (BID) | CUTANEOUS | 0 refills | Status: DC

## 2023-04-27 NOTE — Progress Notes (Signed)
 Date:  04/27/2023   Name:  Theresa Lester   DOB:  02-08-1970   MRN:  [id number]   Chief Complaint: Rash (Foot fungus, patient said its going up her legs and on her hands)  Rash This is a chronic problem. The problem has been gradually worsening since onset. Pertinent negatives include no fatigue, fever or shortness of breath.  She was treated a month ago with Diflucan and Keflex as well as triamcinolone ointment. Her feet are slightly improved but now having more peeling on her palms.  Review of Systems  Constitutional:  Negative for chills, fatigue and fever.  Respiratory:  Negative for chest tightness and shortness of breath.   Cardiovascular:  Negative for chest pain.  Skin:  Positive for color change and rash.       Itching, peeling and cracking  Neurological:  Negative for dizziness and headaches.     Lab Results  Component Value Date   NA 139 02/28/2022   K 4.4 02/28/2022   CO2 22 02/28/2022   GLUCOSE 92 02/28/2022   BUN 12 02/28/2022   CREATININE 0.72 02/28/2022   CALCIUM 9.2 02/28/2022   EGFR 100 02/28/2022   GFRNONAA >60 01/19/2020   Lab Results  Component Value Date   CHOL 169 02/28/2022   HDL 56 02/28/2022   LDLCALC 92 02/28/2022   TRIG 118 02/28/2022   CHOLHDL 3.0 02/28/2022   Lab Results  Component Value Date   TSH 2.500 02/28/2022   Lab Results  Component Value Date   HGBA1C 6.2 (H) 02/28/2022   Lab Results  Component Value Date   WBC 11.3 (H) 02/28/2022   HGB 14.1 02/28/2022   HCT 43.1 02/28/2022   MCV 83 02/28/2022   PLT 321 02/28/2022   Lab Results  Component Value Date   ALT 33 (H) 02/28/2022   AST 25 02/28/2022   ALKPHOS 106 02/28/2022   BILITOT 0.3 02/28/2022   No results found for: "25OHVITD2", "25OHVITD3", "VD25OH"   Patient Active Problem List   Diagnosis Date Noted   Biceps tendinitis of both shoulders 03/29/2021   Rotator cuff impingement syndrome, right 01/25/2021   Cervical paraspinal muscle spasm 01/25/2021    Lateral epicondylitis, left elbow 04/13/2020   Tricompartment osteoarthritis of right knee 04/13/2020   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 02/03/2020   Mild persistent asthma without complication 01/19/2020   Benign neoplasm of cecum    Immunization, BCG 03/01/2018   Tricompartment osteoarthritis of left knee 10/11/2016   Hemifacial spasm    TIA (transient ischemic attack) 06/11/2015   Episodic tension-type headache, not intractable 04/14/2015   Angioneurotic edema 10/19/2014   Calculus of gallbladder 10/19/2014   GERD (gastroesophageal reflux disease) 10/19/2014   Hemorrhoids, internal 10/19/2014   Restless leg syndrome 10/19/2014    No Known Allergies  Past Surgical History:  Procedure Laterality Date   BILATERAL CARPAL TUNNEL RELEASE     COLONOSCOPY WITH PROPOFOL N/A 11/01/2018   Procedure: COLONOSCOPY WITH BIOPSY;  Surgeon: Midge Minium, MD;  Location: [facility];  Service: Endoscopy;  Laterality: N/A;   KNEE ARTHROSCOPY WITH MEDIAL MENISECTOMY Left 01/18/2017   Procedure: KNEE ARTHROSCOPY WITH MEDIAL MENISECTOMY ROOT REPAIR CHONDROPLASTY;  Surgeon: Signa Kell, MD;  Location: [facility];  Service: Orthopedics;  Laterality: Left;  SUPINE WITH ACL LEG HOLDER ARTHREX ALEX  FLIP CUTTER, MENISCUS ROOT GUIDE STRYKER [place] CETERIX AIR  ANESTHESIA: ADDUCTOR CANAL NERVE BLOCK   KNEE ARTHROSCOPY WITH MEDIAL MENISECTOMY Right 07/16/2018   Procedure: KNEE ARTHROSCOPY WITH  MEDIAL MENISCUS  ROOT REPAIR;  Surgeon: Signa Kell, MD;  Location: [facility];  Service: Orthopedics;  Laterality: Right;  MENSICUS ROOT GUIDE KIT + SWIVELOCKS,  SWIVELOCK TIBIAL FIXATION CETERIX OPEN KNEE TRAY BOVIE ELECTROCAUTERY   POLYPECTOMY N/A 11/01/2018   Procedure: POLYPECTOMY;  Surgeon: Midge Minium, MD;  Location: [facility];  Service: Endoscopy;  Laterality: N/A;   TUBAL LIGATION     VESTIBULAR NERVE SECTION Right 2017    Social History   Tobacco Use   Smoking status:  Never   Smokeless tobacco: Never  Vaping Use   Vaping status: Never Used  Substance Use Topics   Alcohol use: Not Currently    Alcohol/week: 0.0 standard drinks of alcohol   Drug use: Never     Medication list has been reviewed and updated.  Current Meds  Medication Sig   albuterol (VENTOLIN HFA) 108 (90 Base) MCG/ACT inhaler Inhale 2 puffs into the lungs every 6 (six) hours as needed for wheezing or shortness of breath.   Budesonide (PULMICORT FLEXHALER) 90 MCG/ACT inhaler Inhale 1 puff into the lungs 2 (two) times daily.   carbamazepine (TEGRETOL XR) 100 MG 12 hr tablet Take 100 mg by mouth daily as needed.   diclofenac (VOLTAREN) 50 MG EC tablet TAKE 1 TABLET(50 MG) BY MOUTH TWICE DAILY FOR 2 WEEKS AS NEEDED   fluconazole (DIFLUCAN) 150 MG tablet Take 1 tablet (150 mg total) by mouth once a week for 6 doses.   gabapentin (NEURONTIN) 100 MG capsule Take 2 capsules (200 mg total) by mouth daily.   hydrocortisone (ANUSOL-HC) 2.5 % rectal cream Place 1 application rectally 2 (two) times daily.   methocarbamol (ROBAXIN) 500 MG tablet Take 1 tablet (500 mg total) by mouth every 8 (eight) hours as needed for muscle spasms.   omeprazole (PRILOSEC) 40 MG capsule TAKE 1 CAPSULE(40 MG) BY MOUTH DAILY   triamcinolone ointment (KENALOG) 0.5 % Apply 1 Application topically 2 (two) times daily. To rash on hands   [DISCONTINUED] nystatin-triamcinolone ointment (MYCOLOG) APPLY TOPICALLY TO THE AFFECTED AREA TWICE DAILY       03/26/2023    3:58 PM 12/21/2022    9:13 AM 11/22/2022    8:12 AM 08/29/2022    1:27 PM  GAD 7 : Generalized Anxiety Score  Nervous, Anxious, on Edge 0 0 0 0  Control/stop worrying 0 0 0 0  Worry too much - different things 0 0 0 0  Trouble relaxing 0 0 0 0  Restless 0 0 0 0  Easily annoyed or irritable 0 0 0 0  Afraid - awful might happen 0 0 0 0  Total GAD 7 Score 0 0 0 0  Anxiety Difficulty Not difficult at all Not difficult at all Not difficult at all Not difficult  at all       03/26/2023    3:58 PM 12/21/2022    9:13 AM 11/22/2022    8:12 AM  Depression screen PHQ 2/9  Decreased Interest 0 0 0  Down, Depressed, Hopeless 0 0 0  PHQ - 2 Score 0 0 0  Altered sleeping 0 0 0  Tired, decreased energy 0 0 0  Change in appetite 0 0 0  Feeling bad or failure about yourself  0 0 0  Trouble concentrating 0 0 0  Moving slowly or fidgety/restless 0 0 0  Suicidal thoughts 0 0 0  PHQ-9 Score 0 0 0  Difficult doing work/chores Not difficult at all Not difficult at all Not  difficult at all    BP Readings from Last 3 Encounters:  04/27/23 (!) 140/96  03/26/23 130/86  12/21/22 124/82    Physical Exam Vitals and nursing note reviewed.  Constitutional:      General: She is not in acute distress.    Appearance: She is well-developed.  HENT:     Head: Normocephalic and atraumatic.  Cardiovascular:     Rate and Rhythm: Normal rate and regular rhythm.  Pulmonary:     Effort: Pulmonary effort is normal. No respiratory distress.     Breath sounds: No wheezing or rhonchi.  Skin:    General: Skin is warm and dry.     Findings: Rash present.     Comments: Thick dystrophic toenails Thick skin on soles of both feet with peeling and cracking Eczematous peeling rash on both palms extending partly up the arms  Neurological:     Mental Status: She is alert and oriented to person, place, and time.  Psychiatric:        Mood and Affect: Mood normal.        Behavior: Behavior normal.     Wt Readings from Last 3 Encounters:  04/27/23 189 lb 5 oz (85.9 kg)  03/26/23 191 lb 2 oz (86.7 kg)  12/21/22 192 lb 3.2 oz (87.2 kg)    BP (!) 140/96   Pulse 85   Ht 5\' 3"  (1.6 m)   Wt 189 lb 5 oz (85.9 kg)   SpO2 98%   BMI 33.54 kg/m   Assessment and Plan:  Problem List Items Addressed This Visit   None Visit Diagnoses       Intrinsic eczema    -  Primary   continue TAC ointment to hands and feet refer to Dermatology urgently   Relevant Orders    Ambulatory referral to Dermatology     Tinea pedis of both feet       s/p Diflucan and Keflex for bacterial superinfection will hold off additional treatment in anticipation of Dermatology evaluation   Relevant Medications   nystatin-triamcinolone ointment (MYCOLOG)   Other Relevant Orders   Ambulatory referral to Dermatology       No follow-ups on file.    Reubin Milan, MD [facility] [place]

## 2023-05-01 ENCOUNTER — Other Ambulatory Visit: Payer: Self-pay | Admitting: Internal Medicine

## 2023-05-01 DIAGNOSIS — G2581 Restless legs syndrome: Secondary | ICD-10-CM

## 2023-05-02 NOTE — Telephone Encounter (Signed)
 Requested medication (s) are due for refill today: Yes  Requested medication (s) are on the active medication list: Yes  Last refill:  02/01/23  Future visit scheduled: Yes  Notes to clinic:  Unable to refill per protocol due to failed labs, no updated results.      Requested Prescriptions  Pending Prescriptions Disp Refills   gabapentin (NEURONTIN) 100 MG capsule [Pharmacy Med Name: GABAPENTIN 100MG  CAPSULES] 90 capsule     Sig: TAKE 1 CAPSULE(100 MG) BY MOUTH EVERY EVENING     Neurology: Anticonvulsants - gabapentin Failed - 05/02/2023  3:24 PM      Failed - Cr in normal range and within 360 days    Creatinine  Date Value Ref Range Status  02/17/2015 0.7 0.6 - 1.1 mg/dL Final   Creatinine, Ser  Date Value Ref Range Status  02/28/2022 0.72 0.57 - 1.00 mg/dL Final         Passed - Completed PHQ-2 or PHQ-9 in the last 360 days      Passed - Valid encounter within last 12 months    Recent Outpatient Visits           5 days ago Intrinsic eczema   [facility], Chales [place], MD   1 month ago Tinea pedis of both feet   [facility] at [facility], Chales [place], MD       Future Appointments             In 1 month Gala Jubilee, Chales [place], MD [facility] at [facility], [facility]   In 4 months [place], Rankin Buzzard, NP [facility], [facility]

## 2023-06-05 ENCOUNTER — Ambulatory Visit (INDEPENDENT_AMBULATORY_CARE_PROVIDER_SITE_OTHER): Payer: Commercial Managed Care - PPO | Admitting: Internal Medicine

## 2023-06-05 ENCOUNTER — Encounter: Payer: Self-pay | Admitting: Internal Medicine

## 2023-06-05 VITALS — BP 132/84 | HR 91 | Ht 63.0 in | Wt 186.4 lb

## 2023-06-05 DIAGNOSIS — J4530 Mild persistent asthma, uncomplicated: Secondary | ICD-10-CM

## 2023-06-05 DIAGNOSIS — E559 Vitamin D deficiency, unspecified: Secondary | ICD-10-CM | POA: Diagnosis not present

## 2023-06-05 DIAGNOSIS — Z13220 Encounter for screening for lipoid disorders: Secondary | ICD-10-CM | POA: Diagnosis not present

## 2023-06-05 DIAGNOSIS — L2084 Intrinsic (allergic) eczema: Secondary | ICD-10-CM

## 2023-06-05 DIAGNOSIS — K219 Gastro-esophageal reflux disease without esophagitis: Secondary | ICD-10-CM

## 2023-06-05 DIAGNOSIS — G2581 Restless legs syndrome: Secondary | ICD-10-CM

## 2023-06-05 DIAGNOSIS — Z0000 Encounter for general adult medical examination without abnormal findings: Secondary | ICD-10-CM

## 2023-06-05 DIAGNOSIS — M62838 Other muscle spasm: Secondary | ICD-10-CM

## 2023-06-05 DIAGNOSIS — R7303 Prediabetes: Secondary | ICD-10-CM | POA: Diagnosis not present

## 2023-06-05 DIAGNOSIS — L0390 Cellulitis, unspecified: Secondary | ICD-10-CM

## 2023-06-05 MED ORDER — GABAPENTIN 100 MG PO CAPS: 200.0000 mg | ORAL_CAPSULE | Freq: Every day | ORAL | 0 refills | Status: DC

## 2023-06-05 MED ORDER — SULFAMETHOXAZOLE-TRIMETHOPRIM 800-160 MG PO TABS: 1.0000 | ORAL_TABLET | Freq: Two times a day (BID) | ORAL | 0 refills | Status: AC

## 2023-06-05 NOTE — Assessment & Plan Note (Signed)
 Recently seen on labs Will call Dermatology to get dosage recommendation

## 2023-06-05 NOTE — Assessment & Plan Note (Signed)
 Seen by Dr. Tyrone Gallop and prescribed Clobetasol and Zoryve with improvement Mainly on feet and nails

## 2023-06-05 NOTE — Assessment & Plan Note (Signed)
 Worsening spasms, esp at night despite robaxin . Recommend taking gabapentin  1-2 several hours before bedtime for spasm and possible RLS component

## 2023-06-05 NOTE — Assessment & Plan Note (Addendum)
 Well controlled with steroid inhaler PRN and PRN albuterol 

## 2023-06-05 NOTE — Assessment & Plan Note (Addendum)
 Not taking gabapentin  at this time Recommend a trial for leg symptoms

## 2023-06-05 NOTE — Assessment & Plan Note (Addendum)
 Reflux symptoms are controlled on over the counter Nexium. Patient denies red flag symptoms - no melena, weight loss, dysphagia.

## 2023-06-05 NOTE — Progress Notes (Addendum)
 Date:  06/05/2023   Name:  Theresa Lester   DOB:  Feb 13, 1970   MRN:  [id number]   Chief Complaint: Annual Exam, Mass (Patient said she has a bump on the outer area of her vulva, cyst like, first noticed it yesterday, tender ), and Spasms (Patient said she has leg pain and spasms at night, started 2 weeks ago) Theresa Lester is a 53 y.o. female who presents today for her Complete Annual Exam. She feels fairly well. She reports exercising none. She reports she is sleeping fairly well. Breast complaints none.  Health Maintenance  Topic Date Due   COVID-19 Vaccine (4 - 2024-25 season) 06/20/2024*   Flu Shot  08/17/2023   Colon Cancer Screening  11/01/2023   Mammogram  01/04/2024   Pap with HPV screening  02/24/2026   DTaP/Tdap/Td vaccine (2 - Td or Tdap) 09/12/2027   Pneumococcal Vaccination  Completed   Hepatitis C Screening  Completed   HIV Screening  Completed   Zoster (Shingles) Vaccine  Completed   HPV Vaccine  Aged Out   Meningitis B Vaccine  Aged Out  *Topic was postponed. The date shown is not the original due date.    Asthma There is no cough, shortness of breath or wheezing. This is a recurrent problem. The problem occurs intermittently. Associated symptoms include myalgias (RLS and muscle spasm in legs). Pertinent negatives include no chest pain, headaches or trouble swallowing. Her past medical history is significant for asthma.    Review of Systems  Constitutional:  Negative for fatigue and unexpected weight change.  HENT:  Negative for trouble swallowing.   Eyes:  Negative for visual disturbance.  Respiratory:  Negative for cough, chest tightness, shortness of breath and wheezing.   Cardiovascular:  Negative for chest pain, palpitations and leg swelling.  Gastrointestinal:  Negative for abdominal pain, constipation and diarrhea.  Genitourinary:  Positive for vaginal pain (labial cyst).  Musculoskeletal:  Positive for myalgias (RLS and muscle spasm  in legs). Negative for arthralgias.  Neurological:  Negative for dizziness, weakness, light-headedness and headaches.  Psychiatric/Behavioral:  Negative for dysphoric mood and sleep disturbance. The patient is not nervous/anxious.      Lab Results  Component Value Date   NA 139 02/28/2022   K 4.4 02/28/2022   CO2 22 02/28/2022   GLUCOSE 92 02/28/2022   BUN 12 02/28/2022   CREATININE 0.72 02/28/2022   CALCIUM 9.2 02/28/2022   EGFR 100 02/28/2022   GFRNONAA >60 01/19/2020   Lab Results  Component Value Date   CHOL 169 02/28/2022   HDL 56 02/28/2022   LDLCALC 92 02/28/2022   TRIG 118 02/28/2022   CHOLHDL 3.0 02/28/2022   Lab Results  Component Value Date   TSH 2.500 02/28/2022   Lab Results  Component Value Date   HGBA1C 6.2 (H) 02/28/2022   Lab Results  Component Value Date   WBC 11.3 (H) 02/28/2022   HGB 14.1 02/28/2022   HCT 43.1 02/28/2022   MCV 83 02/28/2022   PLT 321 02/28/2022   Lab Results  Component Value Date   ALT 33 (H) 02/28/2022   AST 25 02/28/2022   ALKPHOS 106 02/28/2022   BILITOT 0.3 02/28/2022   No results found for: "25OHVITD2", "25OHVITD3", "VD25OH"   Patient Active Problem List   Diagnosis Date Noted   Muscle spasm of both lower legs 06/05/2023   Intrinsic eczema 06/05/2023   Vitamin D deficiency 06/05/2023   Biceps tendinitis of both shoulders 03/29/2021  Rotator cuff impingement syndrome, right 01/25/2021   Cervical paraspinal muscle spasm 01/25/2021   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 02/03/2020   Mild persistent asthma without complication 01/19/2020   Benign neoplasm of cecum    Immunization, BCG 03/01/2018   Tricompartment osteoarthritis of both knees 10/11/2016   TIA (transient ischemic attack) 06/11/2015   Episodic tension-type headache, not intractable 04/14/2015   GERD (gastroesophageal reflux disease) 10/19/2014   Restless leg syndrome 10/19/2014    No Known Allergies  Past Surgical History:  Procedure Laterality Date    BILATERAL CARPAL TUNNEL RELEASE     COLONOSCOPY WITH PROPOFOL  N/A 11/01/2018   Procedure: COLONOSCOPY WITH BIOPSY;  Surgeon: Marnee Sink, MD;  Location: [facility];  Service: Endoscopy;  Laterality: N/A;   KNEE ARTHROSCOPY WITH MEDIAL MENISECTOMY Left 01/18/2017   Procedure: KNEE ARTHROSCOPY WITH MEDIAL MENISECTOMY ROOT REPAIR CHONDROPLASTY;  Surgeon: Lorri Rota, MD;  Location: [facility];  Service: Orthopedics;  Laterality: Left;  SUPINE WITH ACL LEG HOLDER ARTHREX ALEX  FLIP CUTTER, MENISCUS ROOT GUIDE STRYKER [place] CETERIX AIR  ANESTHESIA: ADDUCTOR CANAL NERVE BLOCK   KNEE ARTHROSCOPY WITH MEDIAL MENISECTOMY Right 07/16/2018   Procedure: KNEE ARTHROSCOPY WITH MEDIAL MENISCUS  ROOT REPAIR;  Surgeon: Lorri Rota, MD;  Location: [facility];  Service: Orthopedics;  Laterality: Right;  MENSICUS ROOT GUIDE KIT + SWIVELOCKS,  SWIVELOCK TIBIAL FIXATION CETERIX OPEN KNEE TRAY BOVIE ELECTROCAUTERY   POLYPECTOMY N/A 11/01/2018   Procedure: POLYPECTOMY;  Surgeon: Marnee Sink, MD;  Location: [facility];  Service: Endoscopy;  Laterality: N/A;   TUBAL LIGATION     VESTIBULAR NERVE SECTION Right 2017    Social History   Tobacco Use   Smoking status: Never   Smokeless tobacco: Never  Vaping Use   Vaping status: Never Used  Substance Use Topics   Alcohol use: Not Currently    Alcohol/week: 0.0 standard drinks of alcohol   Drug use: Never     Medication list has been reviewed and updated.  Current Meds  Medication Sig   albuterol  (VENTOLIN  HFA) 108 (90 Base) MCG/ACT inhaler Inhale 2 puffs into the lungs every 6 (six) hours as needed for wheezing or shortness of breath.   Budesonide  (PULMICORT  FLEXHALER) 90 MCG/ACT inhaler Inhale 1 puff into the lungs 2 (two) times daily.   clobetasol ointment (TEMOVATE) 0.05 % Apply topically 2 (two) times daily.   diclofenac  (VOLTAREN ) 50 MG EC tablet TAKE 1 TABLET(50 MG) BY MOUTH TWICE DAILY FOR 2 WEEKS AS NEEDED    esomeprazole (NEXIUM) 20 MG capsule Take 20 mg by mouth daily at 12 noon.   sulfamethoxazole -trimethoprim  (BACTRIM  DS) 800-160 MG tablet Take 1 tablet by mouth 2 (two) times daily for 10 days.   triamcinolone  ointment (KENALOG ) 0.5 % Apply 1 Application topically 2 (two) times daily. To rash on hands   [DISCONTINUED] carbamazepine  (TEGRETOL  XR) 100 MG 12 hr tablet Take 100 mg by mouth daily as needed.   [DISCONTINUED] gabapentin  (NEURONTIN ) 100 MG capsule Take 2 capsules (200 mg total) by mouth daily.   [DISCONTINUED] hydrocortisone  (ANUSOL -HC) 2.5 % rectal cream Place 1 application rectally 2 (two) times daily.   [DISCONTINUED] methocarbamol  (ROBAXIN ) 500 MG tablet Take 1 tablet (500 mg total) by mouth every 8 (eight) hours as needed for muscle spasms.   [DISCONTINUED] nystatin -triamcinolone  ointment (MYCOLOG) Apply topically 2 (two) times daily.   [DISCONTINUED] omeprazole  (PRILOSEC) 40 MG capsule TAKE 1 CAPSULE(40 MG) BY MOUTH DAILY       06/05/2023    3:56  PM 03/26/2023    3:58 PM 12/21/2022    9:13 AM 11/22/2022    8:12 AM  GAD 7 : Generalized Anxiety Score  Nervous, Anxious, on Edge 0 0 0 0  Control/stop worrying 0 0 0 0  Worry too much - different things 0 0 0 0  Trouble relaxing 0 0 0 0  Restless 0 0 0 0  Easily annoyed or irritable 0 0 0 0  Afraid - awful might happen 0 0 0 0  Total GAD 7 Score 0 0 0 0  Anxiety Difficulty Not difficult at all Not difficult at all Not difficult at all Not difficult at all       06/05/2023    3:56 PM 03/26/2023    3:58 PM 12/21/2022    9:13 AM  Depression screen PHQ 2/9  Decreased Interest 0 0 0  Down, Depressed, Hopeless 0 0 0  PHQ - 2 Score 0 0 0  Altered sleeping 0 0 0  Tired, decreased energy 0 0 0  Change in appetite 0 0 0  Feeling bad or failure about yourself  0 0 0  Trouble concentrating 0 0 0  Moving slowly or fidgety/restless 0 0 0  Suicidal thoughts 0 0 0  PHQ-9 Score 0 0 0  Difficult doing work/chores Not difficult at all Not  difficult at all Not difficult at all    BP Readings from Last 3 Encounters:  06/05/23 132/84  04/27/23 (!) 140/96  03/26/23 130/86    Physical Exam Vitals and nursing note reviewed.  Constitutional:      General: She is not in acute distress.    Appearance: She is well-developed.  HENT:     Head: Normocephalic and atraumatic.     Right Ear: Tympanic membrane and ear canal normal.     Left Ear: Tympanic membrane and ear canal normal.     Nose:     Right Sinus: No maxillary sinus tenderness.     Left Sinus: No maxillary sinus tenderness.  Eyes:     General: No scleral icterus.       Right eye: No discharge.        Left eye: No discharge.     Conjunctiva/sclera: Conjunctivae normal.  Neck:     Thyroid : No thyromegaly.     Vascular: No carotid bruit.  Cardiovascular:     Rate and Rhythm: Normal rate and regular rhythm.     Pulses: Normal pulses.     Heart sounds: Normal heart sounds.  Pulmonary:     Effort: Pulmonary effort is normal. No respiratory distress.     Breath sounds: No wheezing.  Abdominal:     General: Bowel sounds are normal.     Palpations: Abdomen is soft.     Tenderness: There is no abdominal tenderness.  Genitourinary:    Labia:        Right: Lesion present.        Comments: Infected cyst with moderate tenderness, soft without drainage Musculoskeletal:     Cervical back: Normal range of motion. No erythema.     Right lower leg: No edema.     Left lower leg: No edema.  Lymphadenopathy:     Cervical: No cervical adenopathy.  Skin:    General: Skin is warm and dry.     Findings: No rash.  Neurological:     Mental Status: She is alert and oriented to person, place, and time.     Cranial Nerves: No cranial nerve  deficit.     Sensory: No sensory deficit.     Deep Tendon Reflexes: Reflexes are normal and symmetric.  Psychiatric:        Attention and Perception: Attention normal.        Mood and Affect: Mood normal.     Wt Readings from Last 3  Encounters:  06/05/23 186 lb 6 oz (84.5 kg)  04/27/23 189 lb 5 oz (85.9 kg)  03/26/23 191 lb 2 oz (86.7 kg)    BP 132/84   Pulse 91   Ht 5\' 3"  (1.6 m)   Wt 186 lb 6 oz (84.5 kg)   SpO2 97%   BMI 33.01 kg/m   Assessment and Plan:  Problem List Items Addressed This Visit       Unprioritized   GERD (gastroesophageal reflux disease) (Chronic)   Reflux symptoms are controlled on over the counter Nexium. Patient denies red flag symptoms - no melena, weight loss, dysphagia.        Relevant Medications   esomeprazole (NEXIUM) 20 MG capsule   Other Relevant Orders   CBC with Differential/Platelet   Restless leg syndrome (Chronic)   Not taking gabapentin  at this time Recommend a trial for leg symptoms      Relevant Medications   gabapentin  (NEURONTIN ) 100 MG capsule   Other Relevant Orders   CBC with Differential/Platelet   TSH   Mild persistent asthma without complication (Chronic)   Well controlled with steroid inhaler PRN and PRN albuterol       Relevant Orders   CBC with Differential/Platelet   Intrinsic eczema (Chronic)   Seen by Dr. Tyrone Gallop and prescribed Clobetasol and Zoryve with improvement Mainly on feet and nails      Muscle spasm of both lower legs   Worsening spasms, esp at night despite robaxin . Recommend taking gabapentin  1-2 several hours before bedtime for spasm and possible RLS component      Vitamin D deficiency   Recently seen on labs Will call Dermatology to get dosage recommendation      Other Visit Diagnoses       Annual physical exam    -  Primary   up to date on screenings and immunizations colonoscopy due this fall   Relevant Orders   CBC with Differential/Platelet   Comprehensive metabolic panel with GFR   Hemoglobin A1c   Lipid panel   TSH     Prediabetes       Relevant Orders   Comprehensive metabolic panel with GFR   Hemoglobin A1c     Screening for lipid disorders       Relevant Orders   Lipid panel     Cellulitis,  unspecified cellulitis site       Relevant Medications   sulfamethoxazole -trimethoprim  (BACTRIM  DS) 800-160 MG tablet   Other Relevant Orders   CBC with Differential/Platelet       No follow-ups on file.    Sheron Dixons, MD [facility] [place]

## 2023-06-06 ENCOUNTER — Ambulatory Visit: Payer: Self-pay | Admitting: Internal Medicine

## 2023-06-06 LAB — LIPID PANEL
Chol/HDL Ratio: 2.3 ratio (ref 0.0–4.4)
Cholesterol, Total: 159 mg/dL (ref 100–199)
HDL: 70 mg/dL (ref >39)
LDL Chol Calc (NIH): 75 mg/dL (ref 0–99)
Triglycerides: 74 mg/dL (ref 0–149)
VLDL Cholesterol Cal: 14 mg/dL (ref 5–40)

## 2023-06-06 LAB — CBC WITH DIFFERENTIAL/PLATELET
Basophils Absolute: 0.1 10*3/uL (ref 0.0–0.2)
Basos: 1 %
EOS (ABSOLUTE): 0.3 10*3/uL (ref 0.0–0.4)
Eos: 3 %
Hematocrit: 42.4 % (ref 34.0–46.6)
Hemoglobin: 13.6 g/dL (ref 11.1–15.9)
Immature Grans (Abs): 0 10*3/uL (ref 0.0–0.1)
Immature Granulocytes: 0 %
Lymphocytes Absolute: 2 10*3/uL (ref 0.7–3.1)
Lymphs: 20 %
MCH: 29.1 pg (ref 26.6–33.0)
MCHC: 32.1 g/dL (ref 31.5–35.7)
MCV: 91 fL (ref 79–97)
Monocytes Absolute: 0.8 10*3/uL (ref 0.1–0.9)
Monocytes: 8 %
Neutrophils Absolute: 6.8 10*3/uL (ref 1.4–7.0)
Neutrophils: 68 %
Platelets: 295 10*3/uL (ref 150–450)
RBC: 4.68 x10E6/uL (ref 3.77–5.28)
RDW: 12.9 % (ref 11.7–15.4)
WBC: 10 10*3/uL (ref 3.4–10.8)

## 2023-06-06 LAB — COMPREHENSIVE METABOLIC PANEL WITH GFR
ALT: 20 IU/L (ref 0–32)
AST: 16 IU/L (ref 0–40)
Albumin: 4.3 g/dL (ref 3.8–4.9)
Alkaline Phosphatase: 105 IU/L (ref 44–121)
BUN/Creatinine Ratio: 25 — ABNORMAL HIGH (ref 9–23)
BUN: 17 mg/dL (ref 6–24)
Bilirubin Total: 0.2 mg/dL (ref 0.0–1.2)
CO2: 25 mmol/L (ref 20–29)
Calcium: 9.1 mg/dL (ref 8.7–10.2)
Chloride: 103 mmol/L (ref 96–106)
Creatinine, Ser: 0.69 mg/dL (ref 0.57–1.00)
Globulin, Total: 2.5 g/dL (ref 1.5–4.5)
Glucose: 92 mg/dL (ref 70–99)
Potassium: 4.3 mmol/L (ref 3.5–5.2)
Sodium: 141 mmol/L (ref 134–144)
Total Protein: 6.8 g/dL (ref 6.0–8.5)
eGFR: 103 mL/min/{1.73_m2} (ref >59)

## 2023-06-06 LAB — TSH: TSH: 3.41 u[IU]/mL (ref 0.450–4.500)

## 2023-06-06 LAB — HEMOGLOBIN A1C
Est. average glucose Bld gHb Est-mCnc: 123 mg/dL
Hgb A1c MFr Bld: 5.9 % — ABNORMAL HIGH (ref 4.8–5.6)

## 2023-06-13 ENCOUNTER — Other Ambulatory Visit: Payer: Self-pay | Admitting: Internal Medicine

## 2023-06-13 DIAGNOSIS — M7541 Impingement syndrome of right shoulder: Secondary | ICD-10-CM

## 2023-06-15 NOTE — Telephone Encounter (Signed)
 Requested medications are due for refill today.  unsure  Requested medications are on the active medications list.  yes  Last refill. 04/09/2023 #60 1 rf  Future visit scheduled.   yes  Notes to clinic.  Rx sig: TAKE 1 TABLET(50 MG) BY MOUTH TWICE DAILY FOR 2 WEEKS AS NEEDED  Unsure if this is to be a maintenance medication.    Requested Prescriptions  Pending Prescriptions Disp Refills   diclofenac  (VOLTAREN ) 50 MG EC tablet [Pharmacy Med Name: DICLOFENAC  SODIUM 50MG  DR TABLETS] 60 tablet 1    Sig: TAKE 1 TABLET(50 MG) BY MOUTH TWICE DAILY FOR 2 WEEKS AS NEEDED     Analgesics:  NSAIDS Failed - 06/15/2023 11:29 AM      Failed - Manual Review: Labs are only required if the patient has taken medication for more than 8 weeks.      Passed - Cr in normal range and within 360 days    Creatinine  Date Value Ref Range Status  02/17/2015 0.7 0.6 - 1.1 mg/dL Final   Creatinine, Ser  Date Value Ref Range Status  06/05/2023 0.69 0.57 - 1.00 mg/dL Final         Passed - HGB in normal range and within 360 days    Hemoglobin  Date Value Ref Range Status  06/05/2023 13.6 11.1 - 15.9 g/dL Final         Passed - PLT in normal range and within 360 days    Platelets  Date Value Ref Range Status  06/05/2023 295 150 - 450 x10E3/uL Final         Passed - HCT in normal range and within 360 days    Hematocrit  Date Value Ref Range Status  06/05/2023 42.4 34.0 - 46.6 % Final         Passed - eGFR is 30 or above and within 360 days    GFR calc Af Amer  Date Value Ref Range Status  09/13/2018 124 >59 mL/min/1.73 Final   GFR, Estimated  Date Value Ref Range Status  01/19/2020 >60 >60 mL/min Final    Comment:    (NOTE) Calculated using the CKD-EPI Creatinine Equation (2021)    eGFR  Date Value Ref Range Status  06/05/2023 103 >59 mL/min/1.73 Final         Passed - Patient is not pregnant      Passed - Valid encounter within last 12 months    Recent Outpatient Visits            1 week ago Annual physical exam   [place] [facility] at [facility], Chales [place], MD   1 month ago Intrinsic eczema   [place] [facility] at [facility], Chales [place], MD   2 months ago Tinea pedis of both feet   [facility] at [facility], Chales [place], MD       Future Appointments             In 2 months Baity, Rankin Buzzard, NP [facility], [facility]

## 2023-06-29 ENCOUNTER — Ambulatory Visit
Admission: RE | Admit: 2023-06-29 | Discharge: 2023-06-29 | Disposition: A | Discharge status: Home / Self Care | Attending: Internal Medicine | Admitting: Internal Medicine

## 2023-06-29 ENCOUNTER — Ambulatory Visit
Admission: RE | Admit: 2023-06-29 | Discharge: 2023-06-29 | Disposition: A | Discharge status: Home / Self Care | Source: Ambulatory Visit | Attending: Internal Medicine | Admitting: Internal Medicine

## 2023-06-29 ENCOUNTER — Ambulatory Visit: Admitting: Internal Medicine

## 2023-06-29 ENCOUNTER — Encounter: Payer: Self-pay | Admitting: Internal Medicine

## 2023-06-29 VITALS — BP 118/76 | HR 95 | Ht 63.0 in | Wt 189.4 lb

## 2023-06-29 DIAGNOSIS — M25551 Pain in right hip: Secondary | ICD-10-CM

## 2023-06-29 MED ORDER — PREDNISONE 10 MG PO TABS: ORAL_TABLET | ORAL | 0 refills | Status: AC

## 2023-06-29 NOTE — Progress Notes (Signed)
 Date:  06/29/2023   Name:  Theresa Lester   DOB:  Jan 23, 1970   MRN:  [id number]   Chief Complaint: Leg Pain (X 1 month, cramps on both legs, radiates up to her hip, has tired OTC tylenol  and magnesium glycinate)  Leg Pain  There was no injury mechanism. The pain is present in the right hip and right thigh. The quality of the pain is described as aching and cramping. The pain is moderate. The pain has been Worsening since onset. Associated symptoms include an inability to bear weight, a loss of motion and muscle weakness. She reports no foreign bodies present. The symptoms are aggravated by movement, palpation and weight bearing. She has tried NSAIDs for the symptoms. The treatment provided mild relief.    Review of Systems  Constitutional:  Negative for chills and fatigue.  Respiratory:  Negative for chest tightness and shortness of breath.   Cardiovascular:  Negative for chest pain and palpitations.  Neurological:  Negative for dizziness, light-headedness and headaches.  Psychiatric/Behavioral:  Negative for dysphoric mood and sleep disturbance. The patient is not nervous/anxious.      Lab Results  Component Value Date   NA 141 06/05/2023   K 4.3 06/05/2023   CO2 25 06/05/2023   GLUCOSE 92 06/05/2023   BUN 17 06/05/2023   CREATININE 0.69 06/05/2023   CALCIUM 9.1 06/05/2023   EGFR 103 06/05/2023   GFRNONAA >60 01/19/2020   Lab Results  Component Value Date   CHOL 159 06/05/2023   HDL 70 06/05/2023   LDLCALC 75 06/05/2023   TRIG 74 06/05/2023   CHOLHDL 2.3 06/05/2023   Lab Results  Component Value Date   TSH 3.410 06/05/2023   Lab Results  Component Value Date   HGBA1C 5.9 (H) 06/05/2023   Lab Results  Component Value Date   WBC 10.0 06/05/2023   HGB 13.6 06/05/2023   HCT 42.4 06/05/2023   MCV 91 06/05/2023   PLT 295 06/05/2023   Lab Results  Component Value Date   ALT 20 06/05/2023   AST 16 06/05/2023   ALKPHOS 105 06/05/2023   BILITOT 0.2  06/05/2023   No results found for: Lucetta Russel, VD25OH   Patient Active Problem List   Diagnosis Date Noted   Muscle spasm of both lower legs 06/05/2023   Intrinsic eczema 06/05/2023   Vitamin D deficiency 06/05/2023   Biceps tendinitis of both shoulders 03/29/2021   Rotator cuff impingement syndrome, right 01/25/2021   Cervical paraspinal muscle spasm 01/25/2021   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 02/03/2020   Mild persistent asthma without complication 01/19/2020   Benign neoplasm of cecum    Immunization, BCG 03/01/2018   Tricompartment osteoarthritis of both knees 10/11/2016   TIA (transient ischemic attack) 06/11/2015   Episodic tension-type headache, not intractable 04/14/2015   GERD (gastroesophageal reflux disease) 10/19/2014   Restless leg syndrome 10/19/2014    No Known Allergies  Past Surgical History:  Procedure Laterality Date   BILATERAL CARPAL TUNNEL RELEASE     COLONOSCOPY WITH PROPOFOL  N/A 11/01/2018   Procedure: COLONOSCOPY WITH BIOPSY;  Surgeon: Marnee Sink, MD;  Location: [facility];  Service: Endoscopy;  Laterality: N/A;   KNEE ARTHROSCOPY WITH MEDIAL MENISECTOMY Left 01/18/2017   Procedure: KNEE ARTHROSCOPY WITH MEDIAL MENISECTOMY ROOT REPAIR CHONDROPLASTY;  Surgeon: Lorri Rota, MD;  Location: [facility];  Service: Orthopedics;  Laterality: Left;  SUPINE WITH ACL LEG HOLDER ARTHREX ALEX  FLIP CUTTER, MENISCUS ROOT GUIDE STRYKER [place] CETERIX AIR  ANESTHESIA: ADDUCTOR CANAL NERVE BLOCK   KNEE ARTHROSCOPY WITH MEDIAL MENISECTOMY Right 07/16/2018   Procedure: KNEE ARTHROSCOPY WITH MEDIAL MENISCUS  ROOT REPAIR;  Surgeon: Lorri Rota, MD;  Location: [facility];  Service: Orthopedics;  Laterality: Right;  MENSICUS ROOT GUIDE KIT + SWIVELOCKS,  SWIVELOCK TIBIAL FIXATION CETERIX OPEN KNEE TRAY BOVIE ELECTROCAUTERY   POLYPECTOMY N/A 11/01/2018   Procedure: POLYPECTOMY;  Surgeon: Marnee Sink, MD;  Location: [facility];  Service: Endoscopy;  Laterality: N/A;   TUBAL LIGATION     VESTIBULAR NERVE SECTION Right 2017    Social History   Tobacco Use   Smoking status: Never   Smokeless tobacco: Never  Vaping Use   Vaping status: Never Used  Substance Use Topics   Alcohol use: Not Currently    Alcohol/week: 0.0 standard drinks of alcohol   Drug use: Never     Medication list has been reviewed and updated.  Current Meds  Medication Sig   albuterol  (VENTOLIN  HFA) 108 (90 Base) MCG/ACT inhaler Inhale 2 puffs into the lungs every 6 (six) hours as needed for wheezing or shortness of breath.   Budesonide  (PULMICORT  FLEXHALER) 90 MCG/ACT inhaler Inhale 1 puff into the lungs 2 (two) times daily.   clobetasol ointment (TEMOVATE) 0.05 % Apply topically 2 (two) times daily.   diclofenac  (VOLTAREN ) 50 MG EC tablet TAKE 1 TABLET(50 MG) BY MOUTH TWICE DAILY FOR 2 WEEKS AS NEEDED   gabapentin  (NEURONTIN ) 100 MG capsule Take 2 capsules (200 mg total) by mouth at bedtime.   predniSONE  (DELTASONE ) 10 MG tablet Take 6 tablets (60 mg total) by mouth daily with breakfast for 1 day, THEN 5 tablets (50 mg total) daily with breakfast for 1 day, THEN 4 tablets (40 mg total) daily with breakfast for 1 day, THEN 3 tablets (30 mg total) daily with breakfast for 1 day, THEN 2 tablets (20 mg total) daily with breakfast for 1 day, THEN 1 tablet (10 mg total) daily with breakfast for 1 day.   triamcinolone  ointment (KENALOG ) 0.5 % Apply 1 Application topically 2 (two) times daily. To rash on hands       06/29/2023    4:12 PM 06/05/2023    3:56 PM 03/26/2023    3:58 PM 12/21/2022    9:13 AM  GAD 7 : Generalized Anxiety Score  Nervous, Anxious, on Edge 0 0 0 0  Control/stop worrying 0 0 0 0  Worry too much - different things 0 0 0 0  Trouble relaxing 0 0 0 0  Restless 0 0 0 0  Easily annoyed or irritable 0 0 0 0  Afraid - awful might happen 0 0 0 0  Total GAD 7 Score 0 0 0 0  Anxiety Difficulty Not difficult at all Not  difficult at all Not difficult at all Not difficult at all       06/29/2023    4:12 PM 06/05/2023    3:56 PM 03/26/2023    3:58 PM  Depression screen PHQ 2/9  Decreased Interest 0 0 0  Down, Depressed, Hopeless 0 0 0  PHQ - 2 Score 0 0 0  Altered sleeping 0 0 0  Tired, decreased energy 0 0 0  Change in appetite 0 0 0  Feeling bad or failure about yourself  0 0 0  Trouble concentrating 0 0 0  Moving slowly or fidgety/restless 0 0 0  Suicidal thoughts 0 0 0  PHQ-9 Score 0 0 0  Difficult doing work/chores Not  difficult at all Not difficult at all Not difficult at all    BP Readings from Last 3 Encounters:  06/29/23 118/76  06/05/23 132/84  04/27/23 (!) 140/96    Physical Exam Vitals and nursing note reviewed.  Constitutional:      General: She is not in acute distress.    Appearance: Normal appearance. She is well-developed. She is ill-appearing.  HENT:     Head: Normocephalic and atraumatic.   Cardiovascular:     Rate and Rhythm: Normal rate and regular rhythm.  Pulmonary:     Effort: Pulmonary effort is normal. No respiratory distress.     Breath sounds: No wheezing or rhonchi.   Musculoskeletal:     Right hip: Tenderness present. Decreased range of motion. Normal strength.     Left hip: Normal.     Right lower leg: No swelling or tenderness.     Left lower leg: No swelling or tenderness. No edema.   Skin:    General: Skin is warm and dry.     Findings: No rash.   Neurological:     Mental Status: She is alert and oriented to person, place, and time.     Motor: No weakness.     Gait: Gait abnormal.     Deep Tendon Reflexes:     Reflex Scores:      Patellar reflexes are 1+ on the right side and 1+ on the left side.      Achilles reflexes are 1+ on the right side and 1+ on the left side.    Comments: Difficulty getting up from chair due to leg pain  Psychiatric:        Mood and Affect: Mood normal.        Behavior: Behavior normal.     Wt Readings from  Last 3 Encounters:  06/29/23 189 lb 6 oz (85.9 kg)  06/05/23 186 lb 6 oz (84.5 kg)  04/27/23 189 lb 5 oz (85.9 kg)    BP 118/76   Pulse 95   Ht 5' 3 (1.6 m)   Wt 189 lb 6 oz (85.9 kg)   SpO2 98%   BMI 33.55 kg/m   Assessment and Plan:  Problem List Items Addressed This Visit   None Visit Diagnoses       Right hip pain    -  Primary   suspect OA right hip Steroid taper and imaging will decide on referral needs once results return   Relevant Medications   predniSONE  (DELTASONE ) 10 MG tablet   Other Relevant Orders   DG HIP UNILAT W OR W/O PELVIS 2-3 VIEWS RIGHT       No follow-ups on file.    Sheron Dixons, MD [facility] [place]

## 2023-07-06 ENCOUNTER — Other Ambulatory Visit: Payer: Self-pay | Admitting: Internal Medicine

## 2023-07-06 DIAGNOSIS — G2581 Restless legs syndrome: Secondary | ICD-10-CM

## 2023-07-09 ENCOUNTER — Ambulatory Visit: Payer: Self-pay | Admitting: Internal Medicine

## 2023-07-10 NOTE — Telephone Encounter (Signed)
 Requested Prescriptions  Pending Prescriptions Disp Refills   gabapentin  (NEURONTIN ) 100 MG capsule [Pharmacy Med Name: GABAPENTIN  100MG  CAPSULES] 60 capsule 0    Sig: TAKE 2 CAPSULES(200 MG) BY MOUTH AT BEDTIME     Neurology: Anticonvulsants - gabapentin  Passed - 07/10/2023 10:25 AM      Passed - Cr in normal range and within 360 days    Creatinine  Date Value Ref Range Status  02/17/2015 0.7 0.6 - 1.1 mg/dL Final   Creatinine, Ser  Date Value Ref Range Status  06/05/2023 0.69 0.57 - 1.00 mg/dL Final         Passed - Completed PHQ-2 or PHQ-9 in the last 360 days      Passed - Valid encounter within last 12 months    Recent Outpatient Visits           1 week ago Right hip pain   [place] [facility] at [facility], Leita DEL, MD   1 month ago Annual physical exam   [facility] at [facility], Leita DEL, MD   2 months ago Intrinsic eczema   [place] [facility] at [facility], Leita DEL, MD   3 months ago Tinea pedis of both feet   [facility] at [facility] Leita DEL, MD       Future Appointments             In 1 month Baity, Angeline ORN, NP [facility], [facility]

## 2023-07-24 ENCOUNTER — Ambulatory Visit: Admitting: Family Medicine

## 2023-07-24 ENCOUNTER — Encounter: Payer: Self-pay | Admitting: Family Medicine

## 2023-07-24 VITALS — BP 128/72 | HR 81 | Ht 63.0 in | Wt 193.0 lb

## 2023-07-24 DIAGNOSIS — M1611 Unilateral primary osteoarthritis, right hip: Secondary | ICD-10-CM | POA: Diagnosis not present

## 2023-07-24 DIAGNOSIS — M25561 Pain in right knee: Secondary | ICD-10-CM | POA: Insufficient documentation

## 2023-07-24 DIAGNOSIS — M24811 Other specific joint derangements of right shoulder, not elsewhere classified: Secondary | ICD-10-CM | POA: Diagnosis not present

## 2023-07-24 MED ORDER — MELOXICAM 15 MG PO TABS
15.0000 mg | ORAL_TABLET | Freq: Every day | ORAL | 0 refills | Status: DC
Start: 2023-07-24 — End: 2023-09-06

## 2023-07-24 NOTE — Patient Instructions (Signed)
 Assessment and Plan  1. Internal Derangement of Right Shoulder:    - Right shoulder pain potentially related to rotator cuff issues.    - Plan:      - Order MRI of the right shoulder without contrast to assess for structural damage.      - Refer to physical therapy to address pain and improve shoulder function.  2. Osteoarthritis of Right Hip:    - Chronic right hip arthritis with significant joint space narrowing and cartilage loss.    - Plan:      - Prescribe meloxicam  15 mg once daily to manage pain and inflammation.      - Pause diclofenac  to avoid overlapping NSAID use.      - Refer to physical therapy in [place] to improve mobility and reduce pain.      - Schedule a follow-up in two weeks to evaluate progress.      - Discuss the potential for a cortisone injection if there is no improvement in symptoms.  3. Right Lower Extremity Pain and Muscle Spasms:    - Plan:      - Include right knee in the physical therapy referral to address compensatory mechanisms from hip arthritis.      - Monitor symptoms and adjust treatment as necessary based on response to therapy.  4. Patellofemoral Arthralgia of Right Knee:    - Right knee patellofemoral pain secondary to compensatory mechanisms from right hip arthritis.    - Plan:      - Address through physical therapy to improve knee function and reduce pain.  Relevant Orders: - MRI of the right shoulder without contrast - Ambulatory referral to physical therapy for both right shoulder and right knee  Monitoring and Follow-Up: - Reassess shoulder and hip symptoms at the follow-up appointment in two weeks. - Evaluate the effectiveness of physical therapy and medication adjustments. - Consider further interventions, such as cortisone injections, if symptoms persist.

## 2023-07-24 NOTE — Assessment & Plan Note (Signed)
 Right knee patellofemoral pain secondary to hip arthritis Right knee patellar arthralgia pain due to compensatory mechanisms from right hip arthritis. No significant structural damage noted. - Include right knee in physical therapy referral.

## 2023-07-24 NOTE — Progress Notes (Signed)
 Primary Care / Sports Medicine Office Visit  Patient Information:  Patient ID: Theresa Lester, female DOB: 01-30-1970 Age: 53 y.o. MRN: [id number]   Theresa Lester is a pleasant 53 y.o. female presenting with the following:  Chief Complaint  Patient presents with   Leg Pain    X 3 months. New problem. Xrays done a few weeks ago with Dr Justus. No injury. Last fall was last year- fell on right hip.    Vitals:   07/24/23 1611  BP: 128/72  Pulse: 81  SpO2: 98%   Vitals:   07/24/23 1611  Weight: 193 lb (87.5 kg)  Height: 5' 3 (1.6 m)   Body mass index is 34.19 kg/m.  DG HIP UNILAT W OR W/O PELVIS 2-3 VIEWS RIGHT Result Date: 07/08/2023 CLINICAL DATA:  Right hip pain EXAM: DG HIP (WITH OR WITHOUT PELVIS) 2-3V RIGHT COMPARISON:  None Available. FINDINGS: There is no evidence of hip fracture or dislocation. There are mild degenerative changes of the both hips and sacroiliac joints. IMPRESSION: Mild degenerative changes of the both hips and sacroiliac joints. Electronically Signed   By: Greig Pique M.D.   On: 07/08/2023 22:38     Independent interpretation of notes and tests performed by another provider:   RADIOLOGY Right Hip X-ray: Arthritis with cartilage loss, joint space narrowing, and osteophyte formation (06/29/2023) Left Hip X-ray: Mild arthritis with joint space narrowing (06/29/2023)  Independently reviewed with patient  Procedures performed:   None  Pertinent History, Exam, Impression, and Recommendations:   Problem List Items Addressed This Visit     Internal derangement of right shoulder   Right shoulder pain Right shoulder pain potentially related to underlying RC issues. Discussed MRI to assess for structural damage. - Order MRI for right shoulder.      Relevant Orders   Ambulatory referral to Physical Therapy   MR Shoulder Right Wo Contrast   Osteoarthritis of right hip - Primary   Right lower extremity pain and muscle  spasms - Pain localized to the right knee and anterior hip - Pain described as a 'stretchy' sensation when walking - Associated with frequent muscle spasms and cramps in the right leg - No numbness or tingling radiating down the leg - Symptoms have persisted for nearly one month without improvement  Right hip arthritis Chronic right hip arthritis with significant joint space narrowing and cartilage loss. Pain exacerbated by movement, persistent over a month. - Prescribe meloxicam  once daily. - Pause diclofenac . - Refer to physical therapy in [place]. - Schedule follow-up in two weeks. - Discuss potential cortisone injection if no improvement in two weeks.      Relevant Medications   meloxicam  (MOBIC ) 15 MG tablet   Other Relevant Orders   Ambulatory referral to Physical Therapy   Patellofemoral arthralgia of right knee   Right knee patellofemoral pain secondary to hip arthritis Right knee patellar arthralgia pain due to compensatory mechanisms from right hip arthritis. No significant structural damage noted. - Include right knee in physical therapy referral.      Relevant Orders   Ambulatory referral to Physical Therapy     Orders & Medications Medications:  Meds ordered this encounter  Medications   meloxicam  (MOBIC ) 15 MG tablet    Sig: Take 1 tablet (15 mg total) by mouth daily.    Dispense:  30 tablet    Refill:  0   Orders Placed This Encounter  Procedures   MR Shoulder Right Wo Contrast  Ambulatory referral to Physical Therapy     Return in about 2 weeks (around 08/07/2023).     Theresa JINNY Ku, MD, [facility]   Primary Care Sports Medicine Primary Care and Sports Medicine at [facility] [place]

## 2023-07-24 NOTE — Assessment & Plan Note (Signed)
 Right lower extremity pain and muscle spasms - Pain localized to the right knee and anterior hip - Pain described as a 'stretchy' sensation when walking - Associated with frequent muscle spasms and cramps in the right leg - No numbness or tingling radiating down the leg - Symptoms have persisted for nearly one month without improvement  Right hip arthritis Chronic right hip arthritis with significant joint space narrowing and cartilage loss. Pain exacerbated by movement, persistent over a month. - Prescribe meloxicam  once daily. - Pause diclofenac . - Refer to physical therapy in [place]. - Schedule follow-up in two weeks. - Discuss potential cortisone injection if no improvement in two weeks.

## 2023-07-24 NOTE — Assessment & Plan Note (Signed)
 Right shoulder pain Right shoulder pain potentially related to underlying RC issues. Discussed MRI to assess for structural damage. - Order MRI for right shoulder.

## 2023-07-27 ENCOUNTER — Other Ambulatory Visit: Payer: Self-pay | Admitting: Family Medicine

## 2023-07-27 ENCOUNTER — Telehealth: Payer: Self-pay

## 2023-07-27 MED ORDER — TRIAZOLAM 0.125 MG PO TABS: ORAL_TABLET | ORAL | 0 refills | Status: DC

## 2023-07-27 NOTE — Telephone Encounter (Signed)
 Copied from CRM [id number]. Topic: Clinical - Medical Advice >> Jul 27, 2023  9:38 AM Willma R wrote: Reason for CRM: Patient is scheduled to have an MRI don't tomorrow on her shoulder and is requesting if her whole body needs to be within the machine. States she is claustrophobic and if so she will need to kind of medication to relax her.  Patient can be reached at [phone]

## 2023-07-27 NOTE — Telephone Encounter (Signed)
 LMOM letting patient know she has a RX to pick up for anxiety. Also let her know you have sent message through [facility]. If she has any questions to call us  back.  JM

## 2023-07-28 ENCOUNTER — Ambulatory Visit: Admission: RE | Admit: 2023-07-28 | Source: Ambulatory Visit

## 2023-08-07 ENCOUNTER — Ambulatory Visit: Admitting: Family Medicine

## 2023-08-07 ENCOUNTER — Encounter: Payer: Self-pay | Admitting: Family Medicine

## 2023-08-07 VITALS — BP 134/100 | HR 82 | Ht 63.0 in | Wt 184.0 lb

## 2023-08-07 DIAGNOSIS — M1611 Unilateral primary osteoarthritis, right hip: Secondary | ICD-10-CM

## 2023-08-07 DIAGNOSIS — M25561 Pain in right knee: Secondary | ICD-10-CM | POA: Diagnosis not present

## 2023-08-07 DIAGNOSIS — M24811 Other specific joint derangements of right shoulder, not elsewhere classified: Secondary | ICD-10-CM | POA: Diagnosis not present

## 2023-08-07 MED ORDER — OXYCODONE-ACETAMINOPHEN 2.5-325 MG PO TABS: ORAL_TABLET | ORAL | 0 refills | Status: AC

## 2023-08-07 MED ORDER — PREDNISONE 20 MG PO TABS: 20.0000 mg | ORAL_TABLET | Freq: Two times a day (BID) | ORAL | 0 refills | Status: AC

## 2023-08-07 MED ORDER — TRIAZOLAM 0.25 MG PO TABS: ORAL_TABLET | ORAL | 0 refills | Status: DC

## 2023-08-07 NOTE — Patient Instructions (Addendum)
 Patient Plan for Post-Visit Guidance  1. Internal Derangement of Right Shoulder:    - Proceed with the MRI of the right shoulder scheduled for this Friday.    - Review MRI results at the follow-up appointment in 3 weeks.  2. Osteoarthritis of Right Hip:    - Discontinue meloxicam  and other NSAIDs while taking prednisone .    - Schedule a follow-up in 3 weeks to assess the response to prednisone  and discuss further treatment options.    - Ensure you have a driver for the follow-up appointment.    - Check MyChart for physical therapy contact information.  3. Patellofemoral Arthralgia of Right Knee:    - Focus on treating the right hip osteoarthritis to help improve knee symptoms.  Red Flags: - If you experience increased pain, swelling, or any new symptoms, contact the office immediately.  SEE MYCHART MESSAGE ABOUT CALLING TO SCHEDULE PHYSICAL THERAPY

## 2023-08-07 NOTE — Assessment & Plan Note (Signed)
 Right hip and knee pain - Chronic right hip pain, worsening over time - Pain localized at the ball and socket joint of the right hip - Pain described as 'really bad' - Associated right knee pain - Meloxicam  taken for pain, but not providing relief - Inquires about stronger pain medication than meloxicam   Physical Exam SPECIAL TESTS: FADIR elicits severe Right hip pain on manipulation. Recreates symptomatology.  Right hip osteoarthritis Chronic osteoarthritis with significant pain and functional impairment. Meloxicam  ineffective. Hip replacement discussed as definitive treatment, patient not wanting surgical options. Prednisone  trial proposed to assess steroid response. - Discontinue meloxicam  and other NSAIDs while on prednisone . - Schedule follow-up in 3 weeks to assess prednisone  response and discuss further treatment. - Send prescription for pre-visit medications to pharmacy. - Ensure she has a driver for follow-up. - Send MyChart message with physical therapy contact information.

## 2023-08-07 NOTE — Progress Notes (Signed)
 Primary Care / Sports Medicine Office Visit  Patient Information:  Patient ID: Theresa Lester, female DOB: 1971-01-11 Age: 53 y.o. MRN: [id number]   Kathie Posa is a pleasant 53 y.o. female presenting with the following:  Chief Complaint  Patient presents with   Hip Pain    Right hip pain continues. Patient takes the meloxicam  and gabapentin . She states the Meloxicam  does not help. Pain is still constant. She has a limp now.     Vitals:   08/07/23 1535  BP: (!) 134/100  Pulse: 82  SpO2: 97%   Vitals:   08/07/23 1535  Weight: 184 lb (83.5 kg)  Height: 5' 3 (1.6 m)   Body mass index is 32.59 kg/m.  No results found.   Independent interpretation of notes and tests performed by another provider:   None  Procedures performed:   None  Pertinent History, Exam, Impression, and Recommendations:   Problem List Items Addressed This Visit     Internal derangement of right shoulder   Right shoulder pain - Right shoulder pain, chronic - MRI scheduled for right shoulder evaluation  Right shoulder pain Chronic pain with concern for etiology related to rotator cuff. MRI scheduled to evaluate soft tissue structures. - Proceed with MRI of the right shoulder scheduled for this Friday. - Review MRI results at follow-up in 3 weeks.      Osteoarthritis of right hip - Primary   Right hip and knee pain - Chronic right hip pain, worsening over time - Pain localized at the ball and socket joint of the right hip - Pain described as 'really bad' - Associated right knee pain - Meloxicam  taken for pain, but not providing relief - Inquires about stronger pain medication than meloxicam   Physical Exam SPECIAL TESTS: FADIR elicits severe Right hip pain on manipulation. Recreates symptomatology.  Right hip osteoarthritis Chronic osteoarthritis with significant pain and functional impairment. Meloxicam  ineffective. Hip replacement discussed as definitive  treatment, patient not wanting surgical options. Prednisone  trial proposed to assess steroid response. - Discontinue meloxicam  and other NSAIDs while on prednisone . - Schedule follow-up in 3 weeks to assess prednisone  response and discuss further treatment. - Send prescription for pre-visit medications to pharmacy. - Ensure she has a driver for follow-up. - Send MyChart message with physical therapy contact information.      Relevant Medications   predniSONE  (DELTASONE ) 20 MG tablet   triazolam  (HALCION ) 0.25 MG tablet   oxycodone -acetaminophen  (PERCOCET) 2.5-325 MG tablet   Patellofemoral arthralgia of right knee   Right knee pain Secondary to altered biomechanics from right hip osteoarthritis. Expected to improve with hip treatment. - Address right hip osteoarthritis to improve knee symptoms.        Orders & Medications Medications:  Meds ordered this encounter  Medications   predniSONE  (DELTASONE ) 20 MG tablet    Sig: Take 1 tablet (20 mg total) by mouth 2 (two) times daily with a meal for 7 days.    Dispense:  14 tablet    Refill:  0   triazolam  (HALCION ) 0.25 MG tablet    Sig: 1 tabs PO 2 hours before procedure.  Do not drive with this medication.    Dispense:  1 tablet    Refill:  0   oxycodone -acetaminophen  (PERCOCET) 2.5-325 MG tablet    Sig: Take 1 tablet PO 1 hour prior to procedure. DO not drive with this mediation.    Dispense:  1 tablet    Refill:  0  No orders of the defined types were placed in this encounter.    No follow-ups on file.     Selinda JINNY Ku, MD, [facility]   Primary Care Sports Medicine Primary [facility] and [facility] Medicine at [facility]

## 2023-08-07 NOTE — Assessment & Plan Note (Signed)
 Right knee pain Secondary to altered biomechanics from right hip osteoarthritis. Expected to improve with hip treatment. - Address right hip osteoarthritis to improve knee symptoms.

## 2023-08-07 NOTE — Assessment & Plan Note (Signed)
 Right shoulder pain - Right shoulder pain, chronic - MRI scheduled for right shoulder evaluation  Right shoulder pain Chronic pain with concern for etiology related to rotator cuff. MRI scheduled to evaluate soft tissue structures. - Proceed with MRI of the right shoulder scheduled for this Friday. - Review MRI results at follow-up in 3 weeks.

## 2023-08-10 ENCOUNTER — Ambulatory Visit
Admission: RE | Admit: 2023-08-10 | Discharge: 2023-08-10 | Disposition: A | Discharge status: Home / Self Care | Source: Ambulatory Visit | Attending: Family Medicine | Admitting: Family Medicine

## 2023-08-10 DIAGNOSIS — M24811 Other specific joint derangements of right shoulder, not elsewhere classified: Secondary | ICD-10-CM | POA: Diagnosis present

## 2023-08-13 ENCOUNTER — Ambulatory Visit: Payer: Self-pay | Admitting: Family Medicine

## 2023-08-14 ENCOUNTER — Other Ambulatory Visit: Payer: Self-pay | Admitting: Internal Medicine

## 2023-08-14 DIAGNOSIS — G2581 Restless legs syndrome: Secondary | ICD-10-CM

## 2023-08-15 NOTE — Telephone Encounter (Signed)
 Requested Prescriptions  Pending Prescriptions Disp Refills   gabapentin  (NEURONTIN ) 100 MG capsule [Pharmacy Med Name: GABAPENTIN  100MG  CAPSULES] 60 capsule 0    Sig: TAKE 2 CAPSULES(200 MG) BY MOUTH AT BEDTIME     Neurology: Anticonvulsants - gabapentin  Passed - 08/15/2023  3:06 PM      Passed - Cr in normal range and within 360 days    Creatinine  Date Value Ref Range Status  02/17/2015 0.7 0.6 - 1.1 mg/dL Final   Creatinine, Ser  Date Value Ref Range Status  06/05/2023 0.69 0.57 - 1.00 mg/dL Final         Passed - Completed PHQ-2 or PHQ-9 in the last 360 days      Passed - Valid encounter within last 12 months    Recent Outpatient Visits           1 week ago Osteoarthritis of right hip, unspecified osteoarthritis type   [facility] Alvia, Selinda PARAS, MD   3 weeks ago Osteoarthritis of right hip, unspecified osteoarthritis type   [facility] at [facility] Alvia, Selinda PARAS, MD   1 month ago Right hip pain   [facility] at [facility], Leita DEL, MD   2 months ago Annual physical exam   [facility] at [facility], Leita DEL, MD   3 months ago Intrinsic eczema   [facility] at [facility], Leita DEL, MD       Future Appointments             In 3 weeks Baity, Angeline ORN, NP [facility], [facility]

## 2023-09-03 ENCOUNTER — Ambulatory Visit: Admitting: Family Medicine

## 2023-09-03 ENCOUNTER — Ambulatory Visit: Admitting: Internal Medicine

## 2023-09-04 ENCOUNTER — Other Ambulatory Visit: Payer: Self-pay | Admitting: Family Medicine

## 2023-09-04 ENCOUNTER — Ambulatory Visit: Admitting: Family Medicine

## 2023-09-04 ENCOUNTER — Ambulatory Visit: Admitting: Internal Medicine

## 2023-09-04 ENCOUNTER — Encounter: Payer: Self-pay | Admitting: Internal Medicine

## 2023-09-04 VITALS — BP 144/80 | HR 87 | Ht 63.0 in | Wt 189.2 lb

## 2023-09-04 DIAGNOSIS — M1611 Unilateral primary osteoarthritis, right hip: Secondary | ICD-10-CM

## 2023-09-04 DIAGNOSIS — J4530 Mild persistent asthma, uncomplicated: Secondary | ICD-10-CM

## 2023-09-04 DIAGNOSIS — L2084 Intrinsic (allergic) eczema: Secondary | ICD-10-CM | POA: Diagnosis not present

## 2023-09-04 DIAGNOSIS — G2581 Restless legs syndrome: Secondary | ICD-10-CM

## 2023-09-04 DIAGNOSIS — K219 Gastro-esophageal reflux disease without esophagitis: Secondary | ICD-10-CM | POA: Diagnosis not present

## 2023-09-04 DIAGNOSIS — G459 Transient cerebral ischemic attack, unspecified: Secondary | ICD-10-CM

## 2023-09-04 DIAGNOSIS — I10 Essential (primary) hypertension: Secondary | ICD-10-CM | POA: Insufficient documentation

## 2023-09-04 DIAGNOSIS — M170 Bilateral primary osteoarthritis of knee: Secondary | ICD-10-CM

## 2023-09-04 DIAGNOSIS — R7303 Prediabetes: Secondary | ICD-10-CM | POA: Insufficient documentation

## 2023-09-04 MED ORDER — OLMESARTAN MEDOXOMIL 20 MG PO TABS: 20.0000 mg | ORAL_TABLET | Freq: Every day | ORAL | 0 refills | Status: DC

## 2023-09-04 NOTE — Assessment & Plan Note (Signed)
 Encouraged regular stretching Continue meloxicam  15 mg daily and gabapentin  200 mg at at bedtime as previously prescribed She will continue to follow with sports medicine

## 2023-09-04 NOTE — Assessment & Plan Note (Signed)
 Continue gabapentin  200 mg at bedtime as previously prescribed

## 2023-09-04 NOTE — Assessment & Plan Note (Signed)
 Not medicated No longer following with dermatology

## 2023-09-04 NOTE — Assessment & Plan Note (Signed)
A1C today Encouraged her to consume a low carb diet

## 2023-09-04 NOTE — Patient Instructions (Signed)
 Hypertension, Adult Hypertension is another name for high blood pressure. High blood pressure forces your heart to work harder to pump blood. This can cause problems over time. There are two numbers in a blood pressure reading. There is a top number (systolic) over a bottom number (diastolic). It is best to have a blood pressure that is below 120/80. What are the causes? The cause of this condition is not known. Some other conditions can lead to high blood pressure. What increases the risk? Some lifestyle factors can make you more likely to develop high blood pressure: Smoking. Not getting enough exercise or physical activity. Being overweight. Having too much fat, sugar, calories, or salt (sodium) in your diet. Drinking too much alcohol . Other risk factors include: Having any of these conditions: Heart disease. Diabetes. High cholesterol. Kidney disease. Obstructive sleep apnea. Having a family history of high blood pressure and high cholesterol. Age. The risk increases with age. Stress. What are the signs or symptoms? High blood pressure may not cause symptoms. Very high blood pressure (hypertensive crisis) may cause: Headache. Fast or uneven heartbeats (palpitations). Shortness of breath. Nosebleed. Vomiting or feeling like you may vomit (nauseous). Changes in how you see. Very bad chest pain. Feeling dizzy. Seizures. How is this treated? This condition is treated by making healthy lifestyle changes, such as: Eating healthy foods. Exercising more. Drinking less alcohol . Your doctor may prescribe medicine if lifestyle changes do not help enough and if: Your top number is above 130. Your bottom number is above 80. Your personal target blood pressure may vary. Follow these instructions at home: Eating and drinking  If told, follow the DASH eating plan. To follow this plan: Fill one half of your plate at each meal with fruits and vegetables. Fill one fourth of your plate  at each meal with whole grains. Whole grains include whole-wheat pasta, brown rice, and whole-grain bread. Eat or drink low-fat dairy products, such as skim milk or low-fat yogurt. Fill one fourth of your plate at each meal with low-fat (lean) proteins. Low-fat proteins include fish, chicken without skin, eggs, beans, and tofu. Avoid fatty meat, cured and processed meat, or chicken with skin. Avoid pre-made or processed food. Limit the amount of salt in your diet to less than 1,500 mg each day. Do not drink alcohol  if: Your doctor tells you not to drink. You are pregnant, may be pregnant, or are planning to become pregnant. If you drink alcohol : Limit how much you have to: 0-1 drink a day for women. 0-2 drinks a day for men. Know how much alcohol  is in your drink. In the [place] one drink equals one 12 oz bottle of beer (355 mL), one 5 oz glass of wine (148 mL), or one 1 oz glass of hard liquor (44 mL). Lifestyle  Work with your doctor to stay at a healthy weight or to lose weight. Ask your doctor what the best weight is for you. Get at least 30 minutes of exercise that causes your heart to beat faster (aerobic exercise) most days of the week. This may include walking, swimming, or biking. Get at least 30 minutes of exercise that strengthens your muscles (resistance exercise) at least 3 days a week. This may include lifting weights or doing Pilates. Do not smoke or use any products that contain nicotine  or tobacco. If you need help quitting, ask your doctor. Check your blood pressure at home as told by your doctor. Keep all follow-up visits. Medicines Take over-the-counter and prescription medicines  only as told by your doctor. Follow directions carefully. Do not skip doses of blood pressure medicine. The medicine does not work as well if you skip doses. Skipping doses also puts you at risk for problems. Ask your doctor about side effects or reactions to medicines that you should watch  for. Contact a doctor if: You think you are having a reaction to the medicine you are taking. You have headaches that keep coming back. You feel dizzy. You have swelling in your ankles. You have trouble with your vision. Get help right away if: You get a very bad headache. You start to feel mixed up (confused). You feel weak or numb. You feel faint. You have very bad pain in your: Chest. Belly (abdomen). You vomit more than once. You have trouble breathing. These symptoms may be an emergency. Get help right away. Call 911. Do not wait to see if the symptoms will go away. Do not drive yourself to the hospital. Summary Hypertension is another name for high blood pressure. High blood pressure forces your heart to work harder to pump blood. For most people, a normal blood pressure is less than 120/80. Making healthy choices can help lower blood pressure. If your blood pressure does not get lower with healthy choices, you may need to take medicine. This information is not intended to replace advice given to you by your health care provider. Make sure you discuss any questions you have with your health care provider. Document Revised: 10/21/2020 Document Reviewed: 10/21/2020 Elsevier Patient Education  2024 [employer].

## 2023-09-04 NOTE — Assessment & Plan Note (Signed)
 Continue pulmicort  90 mcg per actuation twice daily as previously prescribed Continue albuterol  as needed Will monitor

## 2023-09-04 NOTE — Assessment & Plan Note (Signed)
 Avoid foods that trigger your reflux Encourage weight loss as this can help reduce reflux symptoms Continue omeprazole  20 mg OTC as needed

## 2023-09-04 NOTE — Assessment & Plan Note (Signed)
 Discussed BP goal of < 130/80 Will trial olmesartan  20 mg daily Reinforced DASH diet and exercise for weight loss

## 2023-09-04 NOTE — Assessment & Plan Note (Signed)
 No knowledge of this Will review hospital admission from 2017

## 2023-09-04 NOTE — Progress Notes (Signed)
 Subjective:    Patient ID: Theresa Lester, female    DOB: [birth year], 53 y.o.   MRN: [id number]  HPI  Patient presents to clinic today to establish care and for management of the conditions listed below.  Asthma: Mild, persistent.  She denies chronic cough or shortness of breath.  She is using pulmicort  and albuterol  as prescribed.  There are no PFTs on file.  She does not follow with pulmonology.  Eczema: She reports she is not currently taking any medications for this.  She does not follow with dermatology.  GERD: Triggered by spicy food.  She takes omeprazole  OTC as needed with good results.  There is no upper GI on file.  OA: Mainly in her knees and right hip. S/p arthroscopic surgery of bilateral knees.  She takes meloxicam  and gabapentin  as prescribed.  She follows with sports medicine.  RLS: Managed with gabapentin  with some relief of symptoms.  She does not follow with neurology.  Prediabetes: Her last A1c was 5.9%, 05/2023.  She is not taking any oral diabetic medication at this time.  She does not check her sugars.  History of TIA: She denies any knowledge of having a TIA in the past.  There is a hospital admission report from 05/2015 that mentions that she had had a TIA.  She is not taking any cholesterol-lowering medication or aspirin  at this time.  She does not follow with neurology.  HTN: Her BP today is 148/90. She has been told that she had high blood pressure in the past but has never been treated for it.  Review of Systems   Past Medical History:  Diagnosis Date   Acoustic neuroma (HCC)    Arthralgia of hip or thigh 10/19/2014   Blood glucose elevated 10/19/2014   A1C 5.7 [08/2013]    Calculus of gallbladder    Encounter for screening colonoscopy    Fast heart beat 10/19/2014   TFTs normal [02/2014]    GERD (gastroesophageal reflux disease)    Hemorrhoids, internal    Lateral epicondylitis, left elbow 04/13/2020   Left axillary hidradenitis 11/05/2015    Restless leg    Tinnitus 11/25/2014   Tuberculosis     Current Outpatient Medications  Medication Sig Dispense Refill   albuterol  (VENTOLIN  HFA) 108 (90 Base) MCG/ACT inhaler Inhale 2 puffs into the lungs every 6 (six) hours as needed for wheezing or shortness of breath. 1 each 0   Budesonide  (PULMICORT  FLEXHALER) 90 MCG/ACT inhaler Inhale 1 puff into the lungs 2 (two) times daily. 1 each 5   clobetasol ointment (TEMOVATE) 0.05 % Apply topically 2 (two) times daily.     esomeprazole (NEXIUM) 20 MG capsule Take 20 mg by mouth daily at 12 noon. (Patient not taking: Reported on 08/07/2023)     gabapentin  (NEURONTIN ) 100 MG capsule TAKE 2 CAPSULES(200 MG) BY MOUTH AT BEDTIME 60 capsule 0   meloxicam  (MOBIC ) 15 MG tablet Take 1 tablet (15 mg total) by mouth daily. 30 tablet 0   triamcinolone  ointment (KENALOG ) 0.5 % Apply 1 Application topically 2 (two) times daily. To rash on hands 30 g 0   triazolam  (HALCION ) 0.125 MG tablet Take 1 tablet by mouth 30 minutes prior to MRI. If anxiety persists, may take a second 0.125 mg tablet 15 minutes later (maximum 0.25 mg total).  Do not drive with this medication. 2 tablet 0   triazolam  (HALCION ) 0.25 MG tablet 1 tabs PO 2 hours before procedure.  Do not drive with this  medication. 1 tablet 0   No current facility-administered medications for this visit.    No Known Allergies  Family History  Problem Relation Age of Onset   Hypertension Mother    Diabetes Mother    Breast cancer Neg Hx     Social History   Socioeconomic History   Marital status: Married    Spouse name: Toniesha Zellner   Number of children: 4   Years of education: 12   Highest education level: Associate degree: academic program  Occupational History   Occupation: CNA    Comment: Caregiver  Tobacco Use   Smoking status: Never   Smokeless tobacco: Never  Vaping Use   Vaping status: Never Used  Substance and Sexual Activity   Alcohol use: Not Currently    Alcohol/week:  0.0 standard drinks of alcohol   Drug use: Never   Sexual activity: Yes    Partners: Male  Other Topics Concern   Not on file  Social History Narrative   Not on file   Social Drivers of Health   Financial Resource Strain: Low Risk  (03/25/2023)   Overall Financial Resource Strain (CARDIA)    Difficulty of Paying Living Expenses: Not very hard  Food Insecurity: Food Insecurity Present (03/25/2023)   Hunger Vital Sign    Worried About Running Out of Food in the Last Year: Sometimes true    Ran Out of Food in the Last Year: Never true  Transportation Needs: No Transportation Needs (03/25/2023)   PRAPARE - Administrator, Civil Service (Medical): No    Lack of Transportation (Non-Medical): No  Physical Activity: Unknown (03/25/2023)   Exercise Vital Sign    Days of Exercise per Week: 0 days    Minutes of Exercise per Session: Not on file  Stress: No Stress Concern Present (03/25/2023)   [employer] of Occupational Health - Occupational Stress Questionnaire    Feeling of Stress : Not at all  Social Connections: Moderately Isolated (03/25/2023)   Social Connection and Isolation Panel    Frequency of Communication with Friends and Family: More than three times a week    Frequency of Social Gatherings with Friends and Family: Once a week    Attends Religious Services: Never    Database administrator or Organizations: No    Attends Engineer, structural: Not on file    Marital Status: Married  Intimate Partner Violence: Unknown (04/21/2021)   Received from [facility]   HITS    Physically Hurt: Not on file    Insult or Talk Down To: Not on file    Threaten Physical Harm: Not on file    Scream or Curse: Not on file     Constitutional: Pt reports intermittent headaches. Denies fever, malaise, fatigue, or abrupt weight changes.  HEENT: Pt reports blurred vision. Denies eye pain, eye redness, ear pain, ringing in the ears, wax buildup, runny nose, nasal congestion,  bloody nose, or sore throat. Respiratory: Denies difficulty breathing, shortness of breath, cough or sputum production.   Cardiovascular: Denies chest pain, chest tightness, palpitations or swelling in the hands or feet.  Gastrointestinal: Denies abdominal pain, bloating, constipation, diarrhea or blood in the stool.  GU: Denies urgency, frequency, pain with urination, burning sensation, blood in urine, odor or discharge. Musculoskeletal: Patient reports joint pain, mainly in the right hip.  Denies decrease in range of motion, difficulty with gait, muscle pain or joint swelling.  Skin: Denies redness, rashes, lesions or ulcercations.  Neurological: Patient reports restless legs.  Denies dizziness, difficulty with memory, difficulty with speech or problems with balance and coordination.  Psych: Denies anxiety, depression, SI/HI.  No other specific complaints in a complete review of systems (except as listed in HPI above).      Objective:   Physical Exam  BP (!) 148/90 (BP Location: Left Arm, Patient Position: Sitting, Cuff Size: Small)   Pulse 87   Ht 5' 3 (1.6 m)   Wt 189 lb 4 oz (85.8 kg)   SpO2 97%   BMI 33.52 kg/m   Wt Readings from Last 3 Encounters:  08/07/23 184 lb (83.5 kg)  07/24/23 193 lb (87.5 kg)  06/29/23 189 lb 6 oz (85.9 kg)    General: Appears her stated age, obese, in NAD. Skin: Warm, dry and intact. HEENT: Head: normal shape and size; Eyes: sclera white, no icterus, conjunctiva pink, PERRLA and EOMs intact; Cardiovascular: Normal rate and rhythm. S1,S2 noted.  No murmur, rubs or gallops noted. No JVD or BLE edema. No carotid bruits noted. Pulmonary/Chest: Normal effort and positive vesicular breath sounds. No respiratory distress. No wheezes, rales or ronchi noted.  Abdomen: Normal bowel sounds.  Musculoskeletal:  No difficulty with gait.  Neurological: Alert and oriented. Coordination normal.  Psychiatric: Mood and affect normal. Behavior is normal. Judgment  and thought content normal.    BMET    Component Value Date/Time   NA 141 06/05/2023 1624   K 4.3 06/05/2023 1624   CL 103 06/05/2023 1624   CO2 25 06/05/2023 1624   GLUCOSE 92 06/05/2023 1624   GLUCOSE 102 (H) 01/19/2020 1120   BUN 17 06/05/2023 1624   BUN 13.0 02/17/2015 1505   CREATININE 0.69 06/05/2023 1624   CREATININE 0.7 02/17/2015 1505   CALCIUM 9.1 06/05/2023 1624   GFRNONAA >60 01/19/2020 1120   GFRAA 124 09/13/2018 1115    Lipid Panel     Component Value Date/Time   CHOL 159 06/05/2023 1624   TRIG 74 06/05/2023 1624   HDL 70 06/05/2023 1624   CHOLHDL 2.3 06/05/2023 1624   CHOLHDL 2.6 01/19/2020 1120   VLDL 26 01/19/2020 1120   LDLCALC 75 06/05/2023 1624    CBC    Component Value Date/Time   WBC 10.0 06/05/2023 1624   WBC 5.8 01/19/2020 1120   RBC 4.68 06/05/2023 1624   RBC 4.70 01/19/2020 1120   HGB 13.6 06/05/2023 1624   HCT 42.4 06/05/2023 1624   PLT 295 06/05/2023 1624   MCV 91 06/05/2023 1624   MCH 29.1 06/05/2023 1624   MCH 26.6 01/19/2020 1120   MCHC 32.1 06/05/2023 1624   MCHC 31.6 01/19/2020 1120   RDW 12.9 06/05/2023 1624   LYMPHSABS 2.0 06/05/2023 1624   MONOABS 0.6 01/19/2020 1120   EOSABS 0.3 06/05/2023 1624   BASOSABS 0.1 06/05/2023 1624    Hgb A1C Lab Results  Component Value Date   HGBA1C 5.9 (H) 06/05/2023            Assessment & Plan:    RTC in 2 weeks, follow-up HTN, 9 months for your annual exam Angeline Laura, NP

## 2023-09-06 ENCOUNTER — Ambulatory Visit: Payer: Commercial Managed Care - PPO | Admitting: Internal Medicine

## 2023-09-06 NOTE — Telephone Encounter (Signed)
 Requested medication (s) are due for refill today yes  Requested medication (s) are on the active medication list -yes  Future visit scheduled -no  Last refill: 07/24/23 #30  Notes to clinic: Seen as speciality patient- sent for review to continue.   Requested Prescriptions  Pending Prescriptions Disp Refills   meloxicam  (MOBIC ) 15 MG tablet [Pharmacy Med Name: MELOXICAM  15MG  TABLETS] 30 tablet 0    Sig: TAKE 1 TABLET(15 MG) BY MOUTH DAILY     Analgesics:  COX2 Inhibitors Failed - 09/06/2023 11:00 AM      Failed - Manual Review: Labs are only required if the patient has taken medication for more than 8 weeks.      Passed - HGB in normal range and within 360 days    Hemoglobin  Date Value Ref Range Status  06/05/2023 13.6 11.1 - 15.9 g/dL Final         Passed - Cr in normal range and within 360 days    Creatinine  Date Value Ref Range Status  02/17/2015 0.7 0.6 - 1.1 mg/dL Final   Creatinine, Ser  Date Value Ref Range Status  06/05/2023 0.69 0.57 - 1.00 mg/dL Final         Passed - HCT in normal range and within 360 days    Hematocrit  Date Value Ref Range Status  06/05/2023 42.4 34.0 - 46.6 % Final         Passed - AST in normal range and within 360 days    AST  Date Value Ref Range Status  06/05/2023 16 0 - 40 IU/L Final         Passed - ALT in normal range and within 360 days    ALT  Date Value Ref Range Status  06/05/2023 20 0 - 32 IU/L Final         Passed - eGFR is 30 or above and within 360 days    GFR calc Af Amer  Date Value Ref Range Status  09/13/2018 124 >59 mL/min/1.73 Final   GFR, Estimated  Date Value Ref Range Status  01/19/2020 >60 >60 mL/min Final    Comment:    (NOTE) Calculated using the CKD-EPI Creatinine Equation (2021)    eGFR  Date Value Ref Range Status  06/05/2023 103 >59 mL/min/1.73 Final         Passed - Patient is not pregnant      Passed - Valid encounter within last 12 months    Recent Outpatient Visits            2 days ago Mild persistent asthma without complication   [place] [facility] [place], Angeline ORN, NP   1 month ago Osteoarthritis of right hip, unspecified osteoarthritis type   [place] [facility], Selinda PARAS, MD   1 month ago Osteoarthritis of right hip, unspecified osteoarthritis type   [facility], Selinda PARAS, MD   2 months ago Right hip pain   [place] [facility], Leita DEL, MD   3 months ago Annual physical exam   [facility] at [facility], Leita DEL, MD                 Requested Prescriptions  Pending Prescriptions Disp Refills   meloxicam  (MOBIC ) 15 MG tablet [Pharmacy Med Name: MELOXICAM  15MG  TABLETS]  30 tablet 0    Sig: TAKE 1 TABLET(15 MG) BY MOUTH DAILY     Analgesics:  COX2 Inhibitors Failed - 09/06/2023 11:00 AM      Failed - Manual Review: Labs are only required if the patient has taken medication for more than 8 weeks.      Passed - HGB in normal range and within 360 days    Hemoglobin  Date Value Ref Range Status  06/05/2023 13.6 11.1 - 15.9 g/dL Final         Passed - Cr in normal range and within 360 days    Creatinine  Date Value Ref Range Status  02/17/2015 0.7 0.6 - 1.1 mg/dL Final   Creatinine, Ser  Date Value Ref Range Status  06/05/2023 0.69 0.57 - 1.00 mg/dL Final         Passed - HCT in normal range and within 360 days    Hematocrit  Date Value Ref Range Status  06/05/2023 42.4 34.0 - 46.6 % Final         Passed - AST in normal range and within 360 days    AST  Date Value Ref Range Status  06/05/2023 16 0 - 40 IU/L Final         Passed - ALT in normal range and within 360 days    ALT  Date Value Ref Range Status  06/05/2023 20 0 - 32 IU/L Final         Passed - eGFR is 30 or above and within 360 days    GFR calc Af  Amer  Date Value Ref Range Status  09/13/2018 124 >59 mL/min/1.73 Final   GFR, Estimated  Date Value Ref Range Status  01/19/2020 >60 >60 mL/min Final    Comment:    (NOTE) Calculated using the CKD-EPI Creatinine Equation (2021)    eGFR  Date Value Ref Range Status  06/05/2023 103 >59 mL/min/1.73 Final         Passed - Patient is not pregnant      Passed - Valid encounter within last 12 months    Recent Outpatient Visits           2 days ago Mild persistent asthma without complication   [place] [facility] [place], Angeline ORN, NP   1 month ago Osteoarthritis of right hip, unspecified osteoarthritis type   [place] [facility], Selinda PARAS, MD   1 month ago Osteoarthritis of right hip, unspecified osteoarthritis type   [facility], Selinda PARAS, MD   2 months ago Right hip pain   [place] [facility] at [facility], Leita DEL, MD   3 months ago Annual physical exam   [facility] at [facility], Leita DEL, MD

## 2023-09-18 ENCOUNTER — Ambulatory Visit: Admitting: Internal Medicine

## 2023-09-27 ENCOUNTER — Ambulatory Visit: Attending: Internal Medicine

## 2023-09-27 ENCOUNTER — Other Ambulatory Visit: Payer: Self-pay | Admitting: Internal Medicine

## 2023-09-27 ENCOUNTER — Encounter: Payer: Self-pay | Admitting: Internal Medicine

## 2023-09-27 ENCOUNTER — Ambulatory Visit (INDEPENDENT_AMBULATORY_CARE_PROVIDER_SITE_OTHER): Admitting: Internal Medicine

## 2023-09-27 VITALS — BP 146/78 | Ht 63.0 in | Wt 189.2 lb

## 2023-09-27 DIAGNOSIS — H9190 Unspecified hearing loss, unspecified ear: Secondary | ICD-10-CM

## 2023-09-27 DIAGNOSIS — R002 Palpitations: Secondary | ICD-10-CM | POA: Diagnosis not present

## 2023-09-27 DIAGNOSIS — H6121 Impacted cerumen, right ear: Secondary | ICD-10-CM

## 2023-09-27 DIAGNOSIS — I10 Essential (primary) hypertension: Secondary | ICD-10-CM | POA: Diagnosis not present

## 2023-09-27 MED ORDER — OLMESARTAN MEDOXOMIL 40 MG PO TABS: 40.0000 mg | ORAL_TABLET | Freq: Every day | ORAL | 0 refills | Status: DC

## 2023-09-27 NOTE — Patient Instructions (Signed)
 Palpitations Palpitations are feelings that your heartbeat is not normal. Your heartbeat may feel like it is: Uneven (irregular). Faster than normal. Fluttering. Skipping a beat. This is usually not a serious problem. However, a doctor will do tests and check your medical history to make sure that you do not have a serious heart problem. Follow these instructions at home: Watch for any changes in your condition. Tell your doctor about any changes. Take these actions to help manage your symptoms: Eating and drinking Follow instructions from your doctor about things to eat and drink. You may be told to avoid these things: Drinks that have caffeine in them, such as coffee, tea, soft drinks, and energy drinks. Chocolate. Alcohol. Diet pills. Lifestyle     Try to lower your stress. These things can help you relax: Yoga. Deep breathing and meditation. Guided imagery. This is using words and images to create positive thoughts. Exercise, including swimming, jogging, and walking. Tell your doctor if you have more abnormal heartbeats when you are active. If you have chest pain or feel short of breath with exercise, do not keep doing the exercise until you are seen by your doctor. Biofeedback. This is using your mind to control things in your body, such as your heartbeat. Get plenty of rest and sleep. Keep a regular bed time. Do not use drugs, such as cocaine or ecstasy. Do not use marijuana. Do not smoke or use any products that contain nicotine or tobacco. If you need help quitting, ask your doctor. General instructions Take over-the-counter and prescription medicines only as told by your doctor. Keep all follow-up visits. You may need more tests if palpitations do not go away or get worse. Contact a doctor if: You keep having fast or uneven heartbeats for a long time. Your symptoms happen more often. Get help right away if: You have chest pain. You feel short of breath. You have a very  bad headache. You feel dizzy. You faint. These symptoms may be an emergency. Get help right away. Call your local emergency services (911 in the [place] Do not wait to see if the symptoms will go away. Do not drive yourself to the hospital. Summary Palpitations are feelings that your heartbeat is uneven or faster than normal. It may feel like your heart is fluttering or skipping a beat. Avoid food and drinks that may cause this condition. These include caffeine, chocolate, and alcohol. Try to lower your stress. Do not smoke or use drugs. Get help right away if you faint, feel dizzy, feel short of breath, have chest pain, or have a very bad headache. This information is not intended to replace advice given to you by your health care provider. Make sure you discuss any questions you have with your health care provider. Document Revised: 05/26/2020 Document Reviewed: 05/26/2020 Elsevier Patient Education  2024 [employer].

## 2023-09-27 NOTE — Progress Notes (Signed)
 Subjective:    Patient ID: Theresa Lester, female    DOB: 23-Jul-1970, 53 y.o.   MRN: [id number]  HPI  Discussed the use of AI scribe software for clinical note transcription with the patient, who gave verbal consent to proceed.  Theresa Lester is a 53 year old female with hypertension who presents for a blood pressure follow-up.  She was started on olmesartan  20 mg at her last visit.  She denies side effects at this time.  Her BP today is 148/88.  She reports her blood pressure has remained elevated at home.    She experiences palpitations, describing them as 'weird'. She experiences her heart racing even when calm, particularly during dreams or when thinking. She denies anxiety but is unsure about what constitutes anxiety. No family history of atrial fibrillation.  She has difficulty understanding speech, especially when people are further away. Her workplace has a lot of echo, exacerbating the issue, and she often has to ask people to repeat themselves, causing frustration among coworkers. She can hear people but cannot understand them if they are not directly in front of her, with ambient noise and echo contributing to the difficulty.       Review of Systems   Past Medical History:  Diagnosis Date   Arthralgia of hip or thigh 10/19/2014   Arthritis    Asthma    Blood glucose elevated 10/19/2014   A1C 5.7 [08/2013]    Calculus of gallbladder    Encounter for screening colonoscopy    Fast heart beat 10/19/2014   TFTs normal [02/2014]    GERD (gastroesophageal reflux disease)    Hemorrhoids, internal    Lateral epicondylitis, left elbow 04/13/2020   Left axillary hidradenitis 11/05/2015   Restless leg    Tinnitus 11/25/2014    Current Outpatient Medications  Medication Sig Dispense Refill   albuterol  (VENTOLIN  HFA) 108 (90 Base) MCG/ACT inhaler Inhale 2 puffs into the lungs every 6 (six) hours as needed for wheezing or shortness of breath. 1 each 0    Budesonide  (PULMICORT  FLEXHALER) 90 MCG/ACT inhaler Inhale 1 puff into the lungs 2 (two) times daily. 1 each 5   gabapentin  (NEURONTIN ) 100 MG capsule TAKE 2 CAPSULES(200 MG) BY MOUTH AT BEDTIME 60 capsule 0   meloxicam  (MOBIC ) 15 MG tablet TAKE 1 TABLET(15 MG) BY MOUTH DAILY 30 tablet 0   olmesartan  (BENICAR ) 20 MG tablet Take 1 tablet (20 mg total) by mouth daily. 90 tablet 0   No current facility-administered medications for this visit.    No Known Allergies  Family History  Problem Relation Age of Onset   Healthy Mother    Healthy Father    Healthy Sister    Healthy Brother    Healthy Daughter    Healthy Son    Breast cancer Neg Hx     Social History   Socioeconomic History   Marital status: Married    Spouse name: Boneta Standre   Number of children: 4   Years of education: 12   Highest education level: Associate degree: academic program  Occupational History   Occupation: CNA    Comment: Caregiver  Tobacco Use   Smoking status: Never   Smokeless tobacco: Never  Vaping Use   Vaping status: Never Used  Substance and Sexual Activity   Alcohol use: Not Currently    Alcohol/week: 0.0 standard drinks of alcohol   Drug use: Never   Sexual activity: Not Currently    Partners: Male  Other  Topics Concern   Not on file  Social History Narrative   Not on file   Social Drivers of Health   Financial Resource Strain: Low Risk  (03/25/2023)   Overall Financial Resource Strain (CARDIA)    Difficulty of Paying Living Expenses: Not very hard  Food Insecurity: Food Insecurity Present (03/25/2023)   Hunger Vital Sign    Worried About Running Out of Food in the Last Year: Sometimes true    Ran Out of Food in the Last Year: Never true  Transportation Needs: No Transportation Needs (03/25/2023)   PRAPARE - Administrator, Civil Service (Medical): No    Lack of Transportation (Non-Medical): No  Physical Activity: Unknown (03/25/2023)   Exercise Vital Sign    Days of  Exercise per Week: 0 days    Minutes of Exercise per Session: Not on file  Stress: No Stress Concern Present (03/25/2023)   [employer] of Occupational Health - Occupational Stress Questionnaire    Feeling of Stress : Not at all  Social Connections: Moderately Isolated (03/25/2023)   Social Connection and Isolation Panel    Frequency of Communication with Friends and Family: More than three times a week    Frequency of Social Gatherings with Friends and Family: Once a week    Attends Religious Services: Never    Database administrator or Organizations: No    Attends Engineer, structural: Not on file    Marital Status: Married  Intimate Partner Violence: Unknown (04/21/2021)   Received from [facility]   HITS    Physically Hurt: Not on file    Insult or Talk Down To: Not on file    Threaten Physical Harm: Not on file    Scream or Curse: Not on file     Constitutional: Pt reports intermittent headaches. Denies fever, malaise, fatigue, or abrupt weight changes.  HEENT: Patient reports difficulty hearing.  Denies eye pain, eye redness, ear pain, ringing in the ears, wax buildup, runny nose, nasal congestion, bloody nose, or sore throat. Respiratory: Patient reports intermittent shortness of breath when laying down.  Denies difficulty breathing, cough or sputum production.   Cardiovascular: Patient reports palpitations.  Denies chest pain, chest tightness, or swelling in the hands or feet.  Gastrointestinal: Denies abdominal pain, bloating, constipation, diarrhea or blood in the stool.  GU: Denies urgency, frequency, pain with urination, burning sensation, blood in urine, odor or discharge. Musculoskeletal: Patient reports joint pain, mainly in the right hip.  Denies decrease in range of motion, difficulty with gait, muscle pain or joint swelling.  Skin: Denies redness, rashes, lesions or ulcercations.  Neurological: Patient reports restless legs.  Denies dizziness, difficulty  with memory, difficulty with speech or problems with balance and coordination.  Psych: Denies anxiety, depression, SI/HI.  No other specific complaints in a complete review of systems (except as listed in HPI above).      Objective:   Physical Exam  BP (!) 148/88 (BP Location: Left Arm, Patient Position: Sitting, Cuff Size: Normal)   Ht 5' 3 (1.6 m)   Wt 189 lb 3.2 oz (85.8 kg)   BMI 33.52 kg/m    Wt Readings from Last 3 Encounters:  09/04/23 189 lb 4 oz (85.8 kg)  08/07/23 184 lb (83.5 kg)  07/24/23 193 lb (87.5 kg)    General: Appears her stated age, obese, in NAD. Skin: Warm, dry and intact. HEENT: Head: normal shape and size; Eyes: sclera white, no icterus, conjunctiva pink, PERRLA  and EOMs intact; Ears: Left TM gray and intact, normal light reflex, no effusion.  Cerumen impaction noted in the right ear. Cardiovascular: Normal rate and rhythm. S1,S2 noted.  No murmur, rubs or gallops noted. No JVD or BLE edema.  Pulmonary/Chest: Normal effort and positive vesicular breath sounds. No respiratory distress. No wheezes, rales or ronchi noted.  Musculoskeletal:  No difficulty with gait.  Neurological: Alert and oriented. Coordination normal.   BMET    Component Value Date/Time   NA 141 06/05/2023 1624   K 4.3 06/05/2023 1624   CL 103 06/05/2023 1624   CO2 25 06/05/2023 1624   GLUCOSE 92 06/05/2023 1624   GLUCOSE 102 (H) 01/19/2020 1120   BUN 17 06/05/2023 1624   BUN 13.0 02/17/2015 1505   CREATININE 0.69 06/05/2023 1624   CREATININE 0.7 02/17/2015 1505   CALCIUM 9.1 06/05/2023 1624   GFRNONAA >60 01/19/2020 1120   GFRAA 124 09/13/2018 1115    Lipid Panel     Component Value Date/Time   CHOL 159 06/05/2023 1624   TRIG 74 06/05/2023 1624   HDL 70 06/05/2023 1624   CHOLHDL 2.3 06/05/2023 1624   CHOLHDL 2.6 01/19/2020 1120   VLDL 26 01/19/2020 1120   LDLCALC 75 06/05/2023 1624    CBC    Component Value Date/Time   WBC 10.0 06/05/2023 1624   WBC 5.8  01/19/2020 1120   RBC 4.68 06/05/2023 1624   RBC 4.70 01/19/2020 1120   HGB 13.6 06/05/2023 1624   HCT 42.4 06/05/2023 1624   PLT 295 06/05/2023 1624   MCV 91 06/05/2023 1624   MCH 29.1 06/05/2023 1624   MCH 26.6 01/19/2020 1120   MCHC 32.1 06/05/2023 1624   MCHC 31.6 01/19/2020 1120   RDW 12.9 06/05/2023 1624   LYMPHSABS 2.0 06/05/2023 1624   MONOABS 0.6 01/19/2020 1120   EOSABS 0.3 06/05/2023 1624   BASOSABS 0.1 06/05/2023 1624    Hgb A1C Lab Results  Component Value Date   HGBA1C 5.9 (H) 06/05/2023            Assessment & Plan:   Assessment and Plan    Hypertension Hypertension remains uncontrolled on olmesartan  20 mg. - Increase olmesartan  to 40 mg daily by taking two 20 mg tablets each morning. - Send a new prescription for olmesartan  40 mg once current supply is finished. -Reinforced DASH diet and exercise for weight loss - Schedule follow-up in two weeks to reassess blood pressure.  Palpitations Reports palpitations with normal heart rate. - Order a 3-day heart monitor for continuous heart rhythm evaluation. - Review results with a cardiologist after monitoring period.  Impacted cerumen, right ear Right ear completely filled with cerumen, obstructing eardrum view. - Perform ear irrigation to remove cerumen from the right ear. - Plan for a hearing test at the next visit after ear irrigation.      RTC in 2 weeks, follow-up HTN, 8 months for your annual exam Angeline Laura, NP

## 2023-09-28 NOTE — Telephone Encounter (Signed)
 Duplicate request, LRF 09/27/23.  Requested Prescriptions  Pending Prescriptions Disp Refills   olmesartan  (BENICAR ) 40 MG tablet [Pharmacy Med Name: OLMESARTAN  MEDOXOMIL 40MG  TABLETS] 90 tablet     Sig: TAKE 1 TABLET(40 MG) BY MOUTH DAILY     Cardiovascular:  Angiotensin Receptor Blockers Failed - 09/28/2023 11:19 AM      Failed - Last BP in normal range    BP Readings from Last 1 Encounters:  09/27/23 (!) 146/78         Passed - Cr in normal range and within 180 days    Creatinine  Date Value Ref Range Status  02/17/2015 0.7 0.6 - 1.1 mg/dL Final   Creatinine, Ser  Date Value Ref Range Status  06/05/2023 0.69 0.57 - 1.00 mg/dL Final         Passed - K in normal range and within 180 days    Potassium  Date Value Ref Range Status  06/05/2023 4.3 3.5 - 5.2 mmol/L Final         Passed - Patient is not pregnant      Passed - Valid encounter within last 6 months    Recent Outpatient Visits           Yesterday Palpitations   [place] [facility] [place], Angeline ORN, NP   3 weeks ago Mild persistent asthma without complication   [place] [facility] [place], Angeline ORN, NP   1 month ago Osteoarthritis of right hip, unspecified osteoarthritis type   [facility], Selinda PARAS, MD   2 months ago Osteoarthritis of right hip, unspecified osteoarthritis type   [facility], Selinda PARAS, MD   3 months ago Right hip pain   [facility] at [facility], Leita DEL, MD

## 2023-10-08 ENCOUNTER — Other Ambulatory Visit: Payer: Self-pay | Admitting: Internal Medicine

## 2023-10-08 DIAGNOSIS — G2581 Restless legs syndrome: Secondary | ICD-10-CM

## 2023-10-09 ENCOUNTER — Encounter: Payer: Self-pay | Admitting: Family Medicine

## 2023-10-09 NOTE — Telephone Encounter (Signed)
 Requested Prescriptions  Pending Prescriptions Disp Refills   gabapentin  (NEURONTIN ) 100 MG capsule [Pharmacy Med Name: GABAPENTIN  100MG  CAPSULES] 180 capsule 0    Sig: TAKE 2 CAPSULES(200 MG) BY MOUTH AT BEDTIME     Neurology: Anticonvulsants - gabapentin  Passed - 10/09/2023  9:44 AM      Passed - Cr in normal range and within 360 days    Creatinine  Date Value Ref Range Status  02/17/2015 0.7 0.6 - 1.1 mg/dL Final   Creatinine, Ser  Date Value Ref Range Status  06/05/2023 0.69 0.57 - 1.00 mg/dL Final         Passed - Completed PHQ-2 or PHQ-9 in the last 360 days      Passed - Valid encounter within last 12 months    Recent Outpatient Visits           1 week ago Palpitations   [place] [facility] [place], [place] W, NP   1 month ago Mild persistent asthma without complication   [place] [facility] [place], Angeline ORN, NP   2 months ago Osteoarthritis of right hip, unspecified osteoarthritis type   [facility], Selinda PARAS, MD   2 months ago Osteoarthritis of right hip, unspecified osteoarthritis type   [facility], Selinda PARAS, MD   3 months ago Right hip pain   [facility] Medicine at [facility], Leita DEL, MD

## 2023-10-10 ENCOUNTER — Other Ambulatory Visit: Payer: Self-pay

## 2023-10-10 DIAGNOSIS — M1611 Unilateral primary osteoarthritis, right hip: Secondary | ICD-10-CM

## 2023-10-10 MED ORDER — MELOXICAM 15 MG PO TABS: 15.0000 mg | ORAL_TABLET | Freq: Every day | ORAL | 0 refills | Status: AC

## 2023-10-11 ENCOUNTER — Encounter: Payer: Self-pay | Admitting: Internal Medicine

## 2023-10-11 ENCOUNTER — Ambulatory Visit: Admitting: Internal Medicine

## 2023-10-11 VITALS — BP 132/82 | Ht 63.0 in | Wt 189.6 lb

## 2023-10-11 DIAGNOSIS — E66811 Obesity, class 1: Secondary | ICD-10-CM

## 2023-10-11 DIAGNOSIS — R0602 Shortness of breath: Secondary | ICD-10-CM

## 2023-10-11 DIAGNOSIS — Z6833 Body mass index (BMI) 33.0-33.9, adult: Secondary | ICD-10-CM

## 2023-10-11 DIAGNOSIS — R002 Palpitations: Secondary | ICD-10-CM | POA: Diagnosis not present

## 2023-10-11 DIAGNOSIS — Z23 Encounter for immunization: Secondary | ICD-10-CM

## 2023-10-11 DIAGNOSIS — I10 Essential (primary) hypertension: Secondary | ICD-10-CM

## 2023-10-11 DIAGNOSIS — E6609 Other obesity due to excess calories: Secondary | ICD-10-CM

## 2023-10-11 NOTE — Assessment & Plan Note (Signed)
 Encouraged diet and exercise for weight loss ?

## 2023-10-11 NOTE — Progress Notes (Signed)
 Subjective:    Patient ID: Theresa Lester, female    DOB: 1970/02/13, 53 y.o.   MRN: [id number]  HPI     Discussed the use of AI scribe software for clinical note transcription with the patient, who gave verbal consent to proceed.  Theresa Lester is a 53 year old female with hypertension who presents for a blood pressure follow-up.  Her blood pressure was previously elevated, leading to an increase in her olmesartan  dosage to 40 mg. Today, her blood pressure is recorded at 132/82 mmHg, showing improvement.  She has been experiencing palpitations, primarily at night, and has undergone a Holter monitor test. The results of this test are pending. She also reports experiencing shortness of breath at night when lying down. She reports these symptoms are unchanged. She has not been evaluated by cardiology.      Review of Systems   Past Medical History:  Diagnosis Date   Arthralgia of hip or thigh 10/19/2014   Arthritis    Asthma    Blood glucose elevated 10/19/2014   A1C 5.7 [08/2013]    Calculus of gallbladder    Encounter for screening colonoscopy    Fast heart beat 10/19/2014   TFTs normal [02/2014]    GERD (gastroesophageal reflux disease)    Hemorrhoids, internal    Lateral epicondylitis, left elbow 04/13/2020   Left axillary hidradenitis 11/05/2015   Restless leg    Tinnitus 11/25/2014    Current Outpatient Medications  Medication Sig Dispense Refill   albuterol  (VENTOLIN  HFA) 108 (90 Base) MCG/ACT inhaler Inhale 2 puffs into the lungs every 6 (six) hours as needed for wheezing or shortness of breath. 1 each 0   Budesonide  (PULMICORT  FLEXHALER) 90 MCG/ACT inhaler Inhale 1 puff into the lungs 2 (two) times daily. (Patient not taking: Reported on 09/27/2023) 1 each 5   gabapentin  (NEURONTIN ) 100 MG capsule TAKE 2 CAPSULES(200 MG) BY MOUTH AT BEDTIME 180 capsule 0   meloxicam  (MOBIC ) 15 MG tablet Take 1 tablet (15 mg total) by mouth daily. 30 tablet 0    olmesartan  (BENICAR ) 40 MG tablet Take 1 tablet (40 mg total) by mouth daily. 30 tablet 0   No current facility-administered medications for this visit.    No Known Allergies  Family History  Problem Relation Age of Onset   Healthy Mother    Healthy Father    Healthy Sister    Healthy Brother    Healthy Daughter    Healthy Son    Breast cancer Neg Hx     Social History   Socioeconomic History   Marital status: Married    Spouse name: Karely Hurtado   Number of children: 4   Years of education: 12   Highest education level: Associate degree: academic program  Occupational History   Occupation: CNA    Comment: Caregiver  Tobacco Use   Smoking status: Never   Smokeless tobacco: Never  Vaping Use   Vaping status: Never Used  Substance and Sexual Activity   Alcohol use: Not Currently    Alcohol/week: 0.0 standard drinks of alcohol   Drug use: Never   Sexual activity: Not Currently    Partners: Male  Other Topics Concern   Not on file  Social History Narrative   Not on file   Social Drivers of Health   Financial Resource Strain: Low Risk  (03/25/2023)   Overall Financial Resource Strain (CARDIA)    Difficulty of Paying Living Expenses: Not very hard  Food Insecurity:  Food Insecurity Present (03/25/2023)   Hunger Vital Sign    Worried About Running Out of Food in the Last Year: Sometimes true    Ran Out of Food in the Last Year: Never true  Transportation Needs: No Transportation Needs (03/25/2023)   PRAPARE - Administrator, Civil Service (Medical): No    Lack of Transportation (Non-Medical): No  Physical Activity: Unknown (03/25/2023)   Exercise Vital Sign    Days of Exercise per Week: 0 days    Minutes of Exercise per Session: Not on file  Stress: No Stress Concern Present (03/25/2023)   [employer] of Occupational Health - Occupational Stress Questionnaire    Feeling of Stress : Not at all  Social Connections: Moderately Isolated (03/25/2023)    Social Connection and Isolation Panel    Frequency of Communication with Friends and Family: More than three times a week    Frequency of Social Gatherings with Friends and Family: Once a week    Attends Religious Services: Never    Database administrator or Organizations: No    Attends Engineer, structural: Not on file    Marital Status: Married  Intimate Partner Violence: Unknown (04/21/2021)   Received from [facility]   HITS    Physically Hurt: Not on file    Insult or Talk Down To: Not on file    Threaten Physical Harm: Not on file    Scream or Curse: Not on file     Constitutional: Pt reports intermittent headaches. Denies fever, malaise, fatigue, or abrupt weight changes.  HEENT: Denies eye pain, eye redness, ear pain, ringing in the ears, wax buildup, runny nose, nasal congestion, bloody nose, or sore throat. Respiratory: Patient reports intermittent shortness of breath when laying down.  Denies difficulty breathing, cough or sputum production.   Cardiovascular: Patient reports palpitations.  Denies chest pain, chest tightness, or swelling in the hands or feet.  Musculoskeletal: Patient reports joint pain, mainly in the right hip.  Denies decrease in range of motion, difficulty with gait, muscle pain or joint swelling.  Skin: Denies redness, rashes, lesions or ulcercations.  Neurological: Patient reports restless legs.  Denies dizziness, difficulty with memory, difficulty with speech or problems with balance and coordination.  Psych: Denies anxiety, depression, SI/HI.  No other specific complaints in a complete review of systems (except as listed in HPI above).      Objective:   Physical Exam  BP 132/82 (BP Location: Right Arm, Patient Position: Sitting, Cuff Size: Normal)   Ht 5' 3 (1.6 m)   Wt 189 lb 9.6 oz (86 kg)   BMI 33.59 kg/m     Wt Readings from Last 3 Encounters:  09/27/23 189 lb 3.2 oz (85.8 kg)  09/04/23 189 lb 4 oz (85.8 kg)  08/07/23 184 lb  (83.5 kg)    General: Appears her stated age, obese, in NAD. Skin: Warm, dry and intact. HEENT: Head: normal shape and size; Eyes: sclera white, no icterus, conjunctiva pink, PERRLA and EOMs intact;  Cardiovascular: Normal rate and rhythm. S1,S2 noted.  No murmur, rubs or gallops noted. No JVD or BLE edema.  Pulmonary/Chest: Normal effort and positive vesicular breath sounds. No respiratory distress. No wheezes, rales or ronchi noted.  Musculoskeletal:  No difficulty with gait.  Neurological: Alert and oriented. Coordination normal.   BMET    Component Value Date/Time   NA 141 06/05/2023 1624   K 4.3 06/05/2023 1624   CL 103 06/05/2023 1624  CO2 25 06/05/2023 1624   GLUCOSE 92 06/05/2023 1624   GLUCOSE 102 (H) 01/19/2020 1120   BUN 17 06/05/2023 1624   BUN 13.0 02/17/2015 1505   CREATININE 0.69 06/05/2023 1624   CREATININE 0.7 02/17/2015 1505   CALCIUM 9.1 06/05/2023 1624   GFRNONAA >60 01/19/2020 1120   GFRAA 124 09/13/2018 1115    Lipid Panel     Component Value Date/Time   CHOL 159 06/05/2023 1624   TRIG 74 06/05/2023 1624   HDL 70 06/05/2023 1624   CHOLHDL 2.3 06/05/2023 1624   CHOLHDL 2.6 01/19/2020 1120   VLDL 26 01/19/2020 1120   LDLCALC 75 06/05/2023 1624    CBC    Component Value Date/Time   WBC 10.0 06/05/2023 1624   WBC 5.8 01/19/2020 1120   RBC 4.68 06/05/2023 1624   RBC 4.70 01/19/2020 1120   HGB 13.6 06/05/2023 1624   HCT 42.4 06/05/2023 1624   PLT 295 06/05/2023 1624   MCV 91 06/05/2023 1624   MCH 29.1 06/05/2023 1624   MCH 26.6 01/19/2020 1120   MCHC 32.1 06/05/2023 1624   MCHC 31.6 01/19/2020 1120   RDW 12.9 06/05/2023 1624   LYMPHSABS 2.0 06/05/2023 1624   MONOABS 0.6 01/19/2020 1120   EOSABS 0.3 06/05/2023 1624   BASOSABS 0.1 06/05/2023 1624    Hgb A1C Lab Results  Component Value Date   HGBA1C 5.9 (H) 06/05/2023            Assessment & Plan:   Assessment and Plan    Hypertension Hypertension is well-controlled at  132/82 mmHg. Goal is <130/80 mmHg. Current regimen includes olmesartan  40 mg. - Continue olmesartan  40 mg daily. - Encourage lifestyle modifications: reduce salt intake, engage in aerobic exercise.  Palpitations and nocturnal shortness of breath Palpitations and nocturnal shortness of breath under evaluation with Holter monitor. Management depends on results. - Review Holter monitor results when available. - Communicate results and management plan via MyChart. - Consider cardiologist referral if indicated.   RTC in 8 months for your annual exam Angeline Laura, NP

## 2023-10-18 ENCOUNTER — Ambulatory Visit: Payer: Self-pay | Admitting: Internal Medicine

## 2023-10-18 DIAGNOSIS — R002 Palpitations: Secondary | ICD-10-CM

## 2023-10-23 ENCOUNTER — Other Ambulatory Visit: Payer: Self-pay | Admitting: Internal Medicine

## 2023-10-23 DIAGNOSIS — G2581 Restless legs syndrome: Secondary | ICD-10-CM

## 2023-10-25 ENCOUNTER — Encounter: Payer: Self-pay | Admitting: Internal Medicine

## 2023-10-25 NOTE — Telephone Encounter (Signed)
 Rx 10/09/23 #180- too soon Requested Prescriptions  Pending Prescriptions Disp Refills   gabapentin  (NEURONTIN ) 100 MG capsule [Pharmacy Med Name: GABAPENTIN  100MG  CAPSULES] 180 capsule 0    Sig: TAKE 2 CAPSULES(200 MG) BY MOUTH AT BEDTIME     Neurology: Anticonvulsants - gabapentin  Passed - 10/25/2023  1:52 PM      Passed - Cr in normal range and within 360 days    Creatinine  Date Value Ref Range Status  02/17/2015 0.7 0.6 - 1.1 mg/dL Final   Creatinine, Ser  Date Value Ref Range Status  06/05/2023 0.69 0.57 - 1.00 mg/dL Final         Passed - Completed PHQ-2 or PHQ-9 in the last 360 days      Passed - Valid encounter within last 12 months    Recent Outpatient Visits           2 weeks ago Influenza vaccine administered   [place] [facility] [place], Angeline ORN, NP   4 weeks ago Palpitations   [place] [facility] [place], [place] W, NP   1 month ago Mild persistent asthma without complication   [place] [facility] [place], Angeline ORN, NP   2 months ago Osteoarthritis of right hip, unspecified osteoarthritis type   [facility], Selinda PARAS, MD   3 months ago Osteoarthritis of right hip, unspecified osteoarthritis type   [facility], Selinda PARAS, MD

## 2023-10-26 NOTE — Telephone Encounter (Signed)
 Please review.  KP

## 2023-12-23 ENCOUNTER — Emergency Department

## 2023-12-23 ENCOUNTER — Emergency Department
Admission: EM | Admit: 2023-12-23 | Discharge: 2023-12-23 | Disposition: A | Discharge status: Home / Self Care | Attending: Emergency Medicine | Admitting: Emergency Medicine

## 2023-12-23 ENCOUNTER — Encounter: Payer: Self-pay | Admitting: Emergency Medicine

## 2023-12-23 ENCOUNTER — Other Ambulatory Visit: Payer: Self-pay

## 2023-12-23 DIAGNOSIS — R079 Chest pain, unspecified: Secondary | ICD-10-CM

## 2023-12-23 DIAGNOSIS — R0789 Other chest pain: Secondary | ICD-10-CM | POA: Insufficient documentation

## 2023-12-23 DIAGNOSIS — E041 Nontoxic single thyroid nodule: Secondary | ICD-10-CM | POA: Insufficient documentation

## 2023-12-23 DIAGNOSIS — I10 Essential (primary) hypertension: Secondary | ICD-10-CM | POA: Insufficient documentation

## 2023-12-23 DIAGNOSIS — J45909 Unspecified asthma, uncomplicated: Secondary | ICD-10-CM | POA: Insufficient documentation

## 2023-12-23 LAB — BASIC METABOLIC PANEL WITH GFR
Anion gap: 10 (ref 5–15)
BUN: 12 mg/dL (ref 6–20)
CO2: 28 mmol/L (ref 22–32)
Calcium: 9.2 mg/dL (ref 8.9–10.3)
Chloride: 102 mmol/L (ref 98–111)
Creatinine, Ser: 0.71 mg/dL (ref 0.44–1.00)
GFR, Estimated: >60 mL/min (ref >60)
Glucose, Bld: 105 mg/dL — ABNORMAL HIGH (ref 70–99)
Potassium: 3.9 mmol/L (ref 3.5–5.1)
Sodium: 140 mmol/L (ref 135–145)

## 2023-12-23 LAB — CBC
HCT: 42.9 % (ref 36.0–46.0)
Hemoglobin: 13.8 g/dL (ref 12.0–15.0)
MCH: 28.3 pg (ref 26.0–34.0)
MCHC: 32.2 g/dL (ref 30.0–36.0)
MCV: 88.1 fL (ref 80.0–100.0)
Platelets: 235 K/uL (ref 150–400)
RBC: 4.87 MIL/uL (ref 3.87–5.11)
RDW: 13 % (ref 11.5–15.5)
WBC: 5.7 K/uL (ref 4.0–10.5)
nRBC: 0 % (ref 0.0–0.2)

## 2023-12-23 LAB — TSH: TSH: 3.27 u[IU]/mL (ref 0.350–4.500)

## 2023-12-23 LAB — TROPONIN T, HIGH SENSITIVITY
Troponin T High Sensitivity: <15 ng/L (ref 0–19)
Troponin T High Sensitivity: <15 ng/L (ref 0–19)

## 2023-12-23 LAB — HCG, QUANTITATIVE, PREGNANCY: hCG, Beta Chain, Quant, S: <1 m[IU]/mL (ref <5)

## 2023-12-23 MED ORDER — IOHEXOL 350 MG/ML SOLN
75.0000 mL | Freq: Once | INTRAVENOUS | Status: AC | PRN
  Administered 2023-12-23: 75 mL via INTRAVENOUS

## 2023-12-23 MED ORDER — AMLODIPINE BESYLATE 5 MG PO TABS: 5.0000 mg | ORAL_TABLET | Freq: Every day | ORAL | 1 refills | Status: DC

## 2023-12-23 MED ORDER — LORAZEPAM 2 MG/ML IJ SOLN
1.5000 mg | Freq: Once | INTRAMUSCULAR | Status: AC
  Administered 2023-12-23: 1.5 mg via INTRAVENOUS
  Filled 2023-12-23: qty 1

## 2023-12-23 MED ORDER — ASPIRIN 81 MG PO CHEW
324.0000 mg | CHEWABLE_TABLET | Freq: Once | ORAL | Status: AC
  Administered 2023-12-23: 324 mg via ORAL
  Filled 2023-12-23: qty 4

## 2023-12-23 MED ORDER — AMLODIPINE BESYLATE 5 MG PO TABS
5.0000 mg | ORAL_TABLET | Freq: Once | ORAL | Status: AC
  Administered 2023-12-23: 5 mg via ORAL
  Filled 2023-12-23: qty 1

## 2023-12-23 MED ORDER — ASPIRIN 81 MG PO CHEW: 81.0000 mg | CHEWABLE_TABLET | Freq: Every day | ORAL | 1 refills | Status: AC

## 2023-12-23 NOTE — ED Triage Notes (Signed)
 Patient c/o left sided chest pain onset of about an hour and half prior to arrival. Describes it as a constant sharp pain and left arm numb. Reports hx of HTN and has not taken BP medicine in a week. Also having headache and dizziness.

## 2023-12-23 NOTE — ED Provider Notes (Signed)
 11:00 PM  Assumed care at shift change.   Patient here with chest pain, hypertension.  First troponin negative.  Pain-free now.  Blood pressure improved.  Second troponin negative.  If negative, discharged with cardiology follow-up.  11:20 PM  2nd troponin negative.  HCG negative.  BP 123/77.  Will dc with cardiology follow up, Rx for ASA and norvasc .     Roan [place], Josette SAILOR, DO 12/23/23 2321

## 2023-12-23 NOTE — Discharge Instructions (Addendum)
 You have been seen in the Emergency Department (ED) today for chest pain.  As we have discussed today's test results are normal, but you may require further testing.  Please follow up with the recommended doctor as instructed above in these documents regarding today's emergent visit and your recent symptoms to discuss further management.  Continue to take your regular medications. If you are not doing so already, please also take a daily baby aspirin  (81 mg), at least until you follow up with your doctor.  Return to the Emergency Department (ED) if you experience any further chest pain/pressure/tightness, difficulty breathing, or sudden sweating, or other symptoms that concern you.   Please follow-up closely with your primary care provider.  You will need further follow-up on the thyroid  nodule that was noted today.  Please notify Angeline Laura.

## 2023-12-23 NOTE — ED Provider Triage Note (Signed)
 Emergency Medicine Provider Triage Evaluation Note  Theresa Lester , a 53 y.o. female  was evaluated in triage.  Pt complains of left side chest pressure.SABRA  Physical Exam  BP (!) 176/100   Pulse 97   Temp 98 F (36.7 C)   Resp 20   SpO2 100%  Gen:   Awake, no distress   Resp:  Normal effort  MSK:   Moves extremities without difficulty  Other:    Medical Decision Making  Medically screening exam initiated at 5:09 PM.  Appropriate orders placed.  Theresa Lester was informed that the remainder of the evaluation will be completed by another provider, this initial triage assessment does not replace that evaluation, and the importance of remaining in the ED until their evaluation is complete.    Theresa Kirk NOVAK, FNP 12/23/23 2359

## 2023-12-23 NOTE — ED Provider Notes (Addendum)
 [facility] Provider Note   Event Date/Time   First MD Initiated Contact with Patient 12/23/23 1940     (approximate)  History   Chest Pain and Dizziness  HPI  Theresa Lester is a 53 y.o. female history of reflux asthma prediabetes hypertension and obesity   About an hour and a half before coming in patient was at work she started having moderate left-sided chest pressure with a slight achy component but radiating and tingling feeling down her left arm as well.  No weakness no speech changes no numbness elsewhere.  She does have high blood pressure was recently started on olmesartan  about a month ago but stopped it a week ago as she noticed she was having frequent headaches while taking it.  The headaches have improved and she is not having 1 now  No abdominal pain.  Reports postmenopausal.  Does not radiate to the back.  Reports is concerned about tingling feeling though not a loss of sensation over the left arm the left side chest pressure.  Has not had any medication or treatment for it yet today  Physical Exam   Triage Vital Signs: ED Triage Vitals  Encounter Vitals Group     BP 12/23/23 1705 (!) 176/100     Girls Systolic BP Percentile --      Girls Diastolic BP Percentile --      Boys Systolic BP Percentile --      Boys Diastolic BP Percentile --      Pulse Rate 12/23/23 1705 97     Resp 12/23/23 1705 20     Temp 12/23/23 1705 98 F (36.7 C)     Temp src --      SpO2 12/23/23 1705 100 %     Weight --      Height --      Head Circumference --      Peak Flow --      Pain Score 12/23/23 1704 7     Pain Loc --      Pain Education --      Exclude from Growth Chart --     Most recent vital signs: Vitals:   12/23/23 2100 12/23/23 2230  BP:  123/77  Pulse:  75  Resp:  20  Temp: 98 F (36.7 C)   SpO2:  100%     General: Awake, no distress.  She is very pleasant with family at bedside CV:  Good peripheral perfusion.  Normal tones  and rate Resp:  Normal effort.  Clear bilateral Abd:  No distention.  Soft nontender Other:  No lower extremity edema venous cords or congestion.  No reproducible pain with movement of the left arm.  No reproducible pain over the left upper chest to palpation.  She denies any injury reports she was not doing any heavy work moving patients or otherwise  Normal facial expressions.  Normal smile.  No pronator drift in the upper extremities.  5 out of 5 strength all extremities.  Discriminate touch equally between the left and right arm.  No obvious sensory deficit but reports a paresthesia-like feeling on the left arm.  No findings noted that would be consistent with acute stroke by exam   ED Results / Procedures / Treatments   Labs (all labs ordered are listed, but only abnormal results are displayed) Labs Reviewed  BASIC METABOLIC PANEL WITH GFR - Abnormal; Notable for the following components:      Result Value  Glucose, Bld 105 (*)    All other components within normal limits  CBC  HCG, QUANTITATIVE, PREGNANCY  TSH  TROPONIN T, HIGH SENSITIVITY  TROPONIN T, HIGH SENSITIVITY     EKG  Inter by me at 1710 heart rate 95 QRS 80 QTc 440 normal sinus rhythm no evidence of acute ischemia.  Mild baseline artifact.   RADIOLOGY  Chest x-ray interpreted by me as normal   CT Angio Chest Aorta W and/or Wo Contrast Result Date: 12/23/2023 EXAM: CTA CHEST AORTA 12/23/2023 08:59:38 PM TECHNIQUE: CTA of the chest was performed without and with the administration of 75 mL of iohexol  (OMNIPAQUE ) 350 MG/ML injection. Multiplanar reformatted images are provided for review. MIP images are provided for review. Automated exposure control, iterative reconstruction, and/or weight based adjustment of the mA/kV was utilized to reduce the radiation dose to as low as reasonably achievable. COMPARISON: None available. CLINICAL HISTORY: Acute aortic syndrome (AAS) suspected. FINDINGS: AORTA: No thoracic  aortic dissection. No aneurysm. MEDIASTINUM: 23 mm mixed attenuation nodule within the thyroid  isthmus, indeterminate. Recommend dedicated thyroid  sonography for further evaluation. The esophagus is unremarkable. The heart and pericardium demonstrate no acute abnormality. LYMPH NODES: No pathologic thoracic adenopathy. No mediastinal, hilar or axillary lymphadenopathy. LUNGS AND PLEURA: The lungs are without acute process. No focal consolidation or pulmonary edema. No pleural effusion or pneumothorax. UPPER ABDOMEN: Limited images of the upper abdomen are unremarkable. SOFT TISSUES AND BONES: No acute bone or soft tissue abnormality. IMPRESSION: 1. No acute abnormality of the aorta. 2. Indeterminate 23 mm thyroid  isthmus nodule. Recommend non-emergent thyroid  ultrasound per ACR guidelines given size threshold for patients 35 years or older. Electronically signed by: Dorethia Molt MD 12/23/2023 09:19 PM EST RP Workstation: [id number]   CT Head Wo Contrast Result Date: 12/23/2023 EXAM: CT HEAD WITHOUT CONTRAST 12/23/2023 08:59:38 PM TECHNIQUE: CT of the head was performed without the administration of intravenous contrast. Automated exposure control, iterative reconstruction, and/or weight based adjustment of the mA/kV was utilized to reduce the radiation dose to as low as reasonably achievable. COMPARISON: None available. CLINICAL HISTORY: Headache, tension-type; headache FINDINGS: BRAIN AND VENTRICLES: No acute hemorrhage. No evidence of acute infarct. No hydrocephalus. No extra-axial collection. No mass effect or midline shift. ORBITS: No acute abnormality. SINUSES: No acute abnormality. SOFT TISSUES AND SKULL: No acute soft tissue abnormality. No skull fracture. IMPRESSION: 1. No acute intracranial abnormality. Electronically signed by: Gilmore Molt MD 12/23/2023 09:03 PM EST RP Workstation: [id number]   DG Chest 2 View Result Date: 12/23/2023 EXAM: 2 VIEW(S) XRAY OF THE CHEST 12/23/2023 05:28:23 PM  COMPARISON: 11/22/2022 CLINICAL HISTORY: chest pain FINDINGS: LUNGS AND PLEURA: No focal pulmonary opacity. No pleural effusion. No pneumothorax. HEART AND MEDIASTINUM: No acute abnormality of the cardiac and mediastinal silhouettes. BONES AND SOFT TISSUES: No acute osseous abnormality. IMPRESSION: 1. No acute cardiopulmonary process. Electronically signed by: Lynwood Seip MD 12/23/2023 05:33 PM EST RP Workstation: [id number]   Discussed with patient thyroid  nodule.  She is aware and will follow-up with Angeline   PROCEDURES:  Critical Care performed: No  Procedures   MEDICATIONS ORDERED IN ED: Medications  amLODipine  (NORVASC ) tablet 5 mg (5 mg Oral Given 12/23/23 2025)  aspirin  chewable tablet 324 mg (324 mg Oral Given 12/23/23 2025)  LORazepam  (ATIVAN ) injection 1.5 mg (1.5 mg Intravenous Given 12/23/23 2035)  iohexol  (OMNIPAQUE ) 350 MG/ML injection 75 mL (75 mLs Intravenous Contrast Given 12/23/23 2043)     IMPRESSION / MDM / ASSESSMENT AND PLAN / ED COURSE  I  reviewed the triage vital signs and the nursing notes.                              Differential diagnosis includes, but is not limited to, ACS, aortic dissection, pulmonary embolism, cardiac tamponade, pneumothorax, pneumonia, pericarditis, myocarditis, GI-related causes including esophagitis/gastritis, and musculoskeletal chest wall pain.    Based on the patient's presentation chest pain and blood pressure concerns such as angina, developing unstable angina NSTEMI are high on the differential.  Hypertensive urgency or emergency is also high.  She reports recent headaches, will start Norvasc , and trial this for blood pressure control along with giving aspirin  at this time.  Because she reports headaches about a week ago that have since resolved I will also include CT of the head to evaluate grossly for acute change mass lesion etc. though no associated acute neurologic deficit  Will continue to observe her closely on cardiac  monitor.  2 to check and follow her pain closely.  If she sees good relief of symptoms with interventions ordered CT angio ruling out dissection etc. is reassuring potentially could go home with close follow-up but if she has persistent pain or discomfort may she may require inpatient evaluation for further workup.  No infectious symptoms no cough.  In her normal state health until symptoms started about an hour and a half prior to arrival  Initial labs reassuring normal CBC metabolic panel normal and initial troponin is normal.  Patient's presentation is most consistent with acute complicated illness / injury requiring diagnostic workup.   The patient is on the cardiac monitor to evaluate for evidence of arrhythmia and/or significant heart rate changes.   Clinical Course as of 12/23/23 2320  [place] Dec 23, 2023  2028 Patient reports she has claustrophobia, needs premedication prior to CT scan.  Will order IV Ativan  as premedication. [MQ]  2320 TSH normal [MQ]    Clinical Course User Index [MQ] Dicky Anes, MD   ----------------------------------------- 11:18 PM on 12/23/2023 ----------------------------------------- Patient asymptomatic resting comfortably.  Blood pressure is normalized.  She is fully alert reports all pain and symptoms have abated.  At this time given resolution of symptoms with blood pressure control more suspicious of hypertensive urgency and if her next troponin is normal and she remains pain-free but anticipate discharge and she is very agreeable to follow-up with cardiology outpatient, her primary care and careful return precautions.  She will start a baby aspirin  daily.  Return precautions and treatment recommendations and follow-up discussed with the patient who is agreeable with the plan.  Reassuring if 2 troponin is normal and symptoms completely abated.  Dr. Neomi to follow trop #2 and hcg (patient reported in menopause)   FINAL CLINICAL IMPRESSION(S) / ED  DIAGNOSES   Final diagnoses:  Chest pain, unspecified type  Thyroid  nodule     Rx / DC Orders   ED Discharge Orders          Ordered    amLODipine  (NORVASC ) 5 MG tablet  Daily        12/23/23 2317             Note:  This document was prepared using Dragon voice recognition software and may include unintentional dictation errors.   Dicky Anes, MD 12/23/23 2320   ----------------------------------------- 11:22 PM on 12/23/2023 ----------------------------------------- Patient's hCG is negative, troponin #2 normal.  She remains pain and symptom-free.  Patient appropriate for discharge.  I had previously  discussed this plan with her and will discharge her now.    Dicky Anes, MD 12/23/23 2322

## 2023-12-23 NOTE — ED Notes (Signed)
 Pt asked for anxiety meds for CT as she is claustrophobic.

## 2023-12-28 ENCOUNTER — Ambulatory Visit: Admitting: Internal Medicine

## 2023-12-28 ENCOUNTER — Encounter: Payer: Self-pay | Admitting: Internal Medicine

## 2023-12-28 VITALS — BP 134/88 | Ht 63.0 in | Wt 192.4 lb

## 2023-12-28 DIAGNOSIS — E6609 Other obesity due to excess calories: Secondary | ICD-10-CM | POA: Diagnosis not present

## 2023-12-28 DIAGNOSIS — E041 Nontoxic single thyroid nodule: Secondary | ICD-10-CM | POA: Diagnosis not present

## 2023-12-28 DIAGNOSIS — R202 Paresthesia of skin: Secondary | ICD-10-CM | POA: Diagnosis not present

## 2023-12-28 DIAGNOSIS — I10 Essential (primary) hypertension: Secondary | ICD-10-CM | POA: Diagnosis not present

## 2023-12-28 DIAGNOSIS — Z6834 Body mass index (BMI) 34.0-34.9, adult: Secondary | ICD-10-CM

## 2023-12-28 DIAGNOSIS — E66811 Obesity, class 1: Secondary | ICD-10-CM | POA: Diagnosis not present

## 2023-12-28 DIAGNOSIS — R0789 Other chest pain: Secondary | ICD-10-CM

## 2023-12-28 NOTE — Patient Instructions (Signed)
 Hypertension, Adult Hypertension is another name for high blood pressure. High blood pressure forces your heart to work harder to pump blood. This can cause problems over time. There are two numbers in a blood pressure reading. There is a top number (systolic) over a bottom number (diastolic). It is best to have a blood pressure that is below 120/80. What are the causes? The cause of this condition is not known. Some other conditions can lead to high blood pressure. What increases the risk? Some lifestyle factors can make you more likely to develop high blood pressure: Smoking. Not getting enough exercise or physical activity. Being overweight. Having too much fat, sugar, calories, or salt (sodium) in your diet. Drinking too much alcohol. Other risk factors include: Having any of these conditions: Heart disease. Diabetes. High cholesterol. Kidney disease. Obstructive sleep apnea. Having a family history of high blood pressure and high cholesterol. Age. The risk increases with age. Stress. What are the signs or symptoms? High blood pressure may not cause symptoms. Very high blood pressure (hypertensive crisis) may cause: Headache. Fast or uneven heartbeats (palpitations). Shortness of breath. Nosebleed. Vomiting or feeling like you may vomit (nauseous). Changes in how you see. Very bad chest pain. Feeling dizzy. Seizures. How is this treated? This condition is treated by making healthy lifestyle changes, such as: Eating healthy foods. Exercising more. Drinking less alcohol. Your doctor may prescribe medicine if lifestyle changes do not help enough and if: Your top number is above 130. Your bottom number is above 80. Your personal target blood pressure may vary. Follow these instructions at home: Eating and drinking  If told, follow the DASH eating plan. To follow this plan: Fill one half of your plate at each meal with fruits and vegetables. Fill one fourth of your plate  at each meal with whole grains. Whole grains include whole-wheat pasta, brown rice, and whole-grain bread. Eat or drink low-fat dairy products, such as skim milk or low-fat yogurt. Fill one fourth of your plate at each meal with low-fat (lean) proteins. Low-fat proteins include fish, chicken without skin, eggs, beans, and tofu. Avoid fatty meat, cured and processed meat, or chicken with skin. Avoid pre-made or processed food. Limit the amount of salt in your diet to less than 1,500 mg each day. Do not drink alcohol if: Your doctor tells you not to drink. You are pregnant, may be pregnant, or are planning to become pregnant. If you drink alcohol: Limit how much you have to: 0-1 drink a day for women. 0-2 drinks a day for men. Know how much alcohol is in your drink. In the [place] one drink equals one 12 oz bottle of beer (355 mL), one 5 oz glass of wine (148 mL), or one 1 oz glass of hard liquor (44 mL). Lifestyle  Work with your doctor to stay at a healthy weight or to lose weight. Ask your doctor what the best weight is for you. Get at least 30 minutes of exercise that causes your heart to beat faster (aerobic exercise) most days of the week. This may include walking, swimming, or biking. Get at least 30 minutes of exercise that strengthens your muscles (resistance exercise) at least 3 days a week. This may include lifting weights or doing Pilates. Do not smoke or use any products that contain nicotine or tobacco. If you need help quitting, ask your doctor. Check your blood pressure at home as told by your doctor. Keep all follow-up visits. Medicines Take over-the-counter and prescription medicines  only as told by your doctor. Follow directions carefully. Do not skip doses of blood pressure medicine. The medicine does not work as well if you skip doses. Skipping doses also puts you at risk for problems. Ask your doctor about side effects or reactions to medicines that you should watch  for. Contact a doctor if: You think you are having a reaction to the medicine you are taking. You have headaches that keep coming back. You feel dizzy. You have swelling in your ankles. You have trouble with your vision. Get help right away if: You get a very bad headache. You start to feel mixed up (confused). You feel weak or numb. You feel faint. You have very bad pain in your: Chest. Belly (abdomen). You vomit more than once. You have trouble breathing. These symptoms may be an emergency. Get help right away. Call 911. Do not wait to see if the symptoms will go away. Do not drive yourself to the hospital. Summary Hypertension is another name for high blood pressure. High blood pressure forces your heart to work harder to pump blood. For most people, a normal blood pressure is less than 120/80. Making healthy choices can help lower blood pressure. If your blood pressure does not get lower with healthy choices, you may need to take medicine. This information is not intended to replace advice given to you by your health care provider. Make sure you discuss any questions you have with your health care provider. Document Revised: 10/21/2020 Document Reviewed: 10/21/2020 Elsevier Patient Education  2024 [employer].

## 2023-12-28 NOTE — Assessment & Plan Note (Signed)
 Encouraged diet and exercise for weight loss ?

## 2023-12-28 NOTE — Progress Notes (Signed)
 Subjective:    Patient ID: Theresa Lester, female    DOB: 04-25-1970, 53 y.o.   MRN: [id number]  HPI   Discussed the use of AI scribe software for clinical note transcription with the patient, who gave verbal consent to proceed.  Theresa Lester is a 53 year old female who presents for an ER follow-up after experiencing chest pain with left upper extremity tingling.  On December 7th, she experienced chest pain accompanied by tingling in her left upper extremity, prompting an ER visit. During the ER evaluation, a metabolic panel, CBC, thyroid  function tests, and troponin levels were all normal. An EKG and chest X-ray were also normal. A CT of the head was performed and was normal. A CT angiogram of the chest ruled out a blood clot but revealed a 23 mm indeterminate thyroid  isthmus nodule. No recurrent chest pain since the ER visit.  Her blood pressure was elevated during the ER visit. She had previously been on olmesartan , which was discontinued due to headaches. She was started on amlodipine  5 mg once daily, and her blood pressure at today's visit was 134/88 mmHg.  She was given aspirin  during the ER visit and has been prescribed a daily baby aspirin  (81 mg).  No swelling in her legs but experiences restless legs in the evenings.  She has a cardiology follow-up scheduled for December 22nd.       Review of Systems   Past Medical History:  Diagnosis Date   Arthralgia of hip or thigh 10/19/2014   Arthritis    Asthma    Blood glucose elevated 10/19/2014   A1C 5.7 [08/2013]    Calculus of gallbladder    Encounter for screening colonoscopy    Fast heart beat 10/19/2014   TFTs normal [02/2014]    GERD (gastroesophageal reflux disease)    Hemorrhoids, internal    Lateral epicondylitis, left elbow 04/13/2020   Left axillary hidradenitis 11/05/2015   Restless leg    Tinnitus 11/25/2014    Current Outpatient Medications  Medication Sig Dispense Refill   albuterol   (VENTOLIN  HFA) 108 (90 Base) MCG/ACT inhaler Inhale 2 puffs into the lungs every 6 (six) hours as needed for wheezing or shortness of breath. 1 each 0   amLODipine  (NORVASC ) 5 MG tablet Take 1 tablet (5 mg total) by mouth daily. 30 tablet 1   aspirin  (ASPIRIN  CHILDRENS) 81 MG chewable tablet Chew 1 tablet (81 mg total) by mouth daily. 30 tablet 1   Budesonide  (PULMICORT  FLEXHALER) 90 MCG/ACT inhaler Inhale 1 puff into the lungs 2 (two) times daily. (Patient taking differently: Inhale 1 puff into the lungs as needed.) 1 each 5   gabapentin  (NEURONTIN ) 100 MG capsule TAKE 2 CAPSULES(200 MG) BY MOUTH AT BEDTIME 180 capsule 0   meloxicam  (MOBIC ) 15 MG tablet Take 1 tablet (15 mg total) by mouth daily. 30 tablet 0   olmesartan  (BENICAR ) 40 MG tablet Take 1 tablet (40 mg total) by mouth daily. 30 tablet 0   No current facility-administered medications for this visit.    No Known Allergies  Family History  Problem Relation Age of Onset   Healthy Mother    Healthy Father    Healthy Sister    Healthy Brother    Healthy Daughter    Healthy Son    Breast cancer Neg Hx     Social History   Socioeconomic History   Marital status: Married    Spouse name: Theresa Lester   Number of children: 4  Years of education: 57   Highest education level: Associate degree: academic program  Occupational History   Occupation: CNA    Comment: Caregiver  Tobacco Use   Smoking status: Never   Smokeless tobacco: Never  Vaping Use   Vaping status: Never Used  Substance and Sexual Activity   Alcohol use: Not Currently    Alcohol/week: 0.0 standard drinks of alcohol   Drug use: Never   Sexual activity: Not Currently    Partners: Male  Other Topics Concern   Not on file  Social History Narrative   Not on file   Social Drivers of Health   Tobacco Use: Low Risk (12/23/2023)   Patient History    Smoking Tobacco Use: Never    Smokeless Tobacco Use: Never    Passive Exposure: Not on file  Financial  Resource Strain: Low Risk (03/25/2023)   Overall Financial Resource Strain (CARDIA)    Difficulty of Paying Living Expenses: Not very hard  Food Insecurity: Food Insecurity Present (03/25/2023)   Hunger Vital Sign    Worried About Running Out of Food in the Last Year: Sometimes true    Ran Out of Food in the Last Year: Never true  Transportation Needs: No Transportation Needs (03/25/2023)   PRAPARE - Administrator, Civil Service (Medical): No    Lack of Transportation (Non-Medical): No  Physical Activity: Unknown (03/25/2023)   Exercise Vital Sign    Days of Exercise per Week: 0 days    Minutes of Exercise per Session: Not on file  Stress: No Stress Concern Present (03/25/2023)   [employer] of Occupational Health - Occupational Stress Questionnaire    Feeling of Stress : Not at all  Social Connections: Moderately Isolated (03/25/2023)   Social Connection and Isolation Panel    Frequency of Communication with Friends and Family: More than three times a week    Frequency of Social Gatherings with Friends and Family: Once a week    Attends Religious Services: Never    Database Administrator or Organizations: No    Attends Engineer, Structural: Not on file    Marital Status: Married  Intimate Partner Violence: Unknown (04/21/2021)   Received from [facility]   HITS    Physically Hurt: Not on file    Insult or Talk Down To: Not on file    Threaten Physical Harm: Not on file    Scream or Curse: Not on file  Depression (PHQ2-9): Low Risk (10/11/2023)   Depression (PHQ2-9)    PHQ-2 Score: 0  Alcohol Screen: Low Risk (03/25/2023)   Alcohol Screen    Last Alcohol Screening Score (AUDIT): 0  Housing: Low Risk (03/25/2023)   Housing Stability Vital Sign    Unable to Pay for Housing in the Last Year: No    Number of Times Moved in the Last Year: 0    Homeless in the Last Year: No  Utilities: Not on file  Health Literacy: Not on file     Constitutional: Pt reports  intermittent headaches. Denies fever, malaise, fatigue, or abrupt weight changes.  HEENT: Denies eye pain, eye redness, ear pain, ringing in the ears, wax buildup, runny nose, nasal congestion, bloody nose, or sore throat. Respiratory: Patient reports intermittent shortness of breath when laying down.  Denies difficulty breathing, cough or sputum production.   Cardiovascular: Patient reports palpitations.  Denies chest pain, chest tightness, or swelling in the hands or feet.  Gastrointestinal: Denies abdominal pain, bloating, constipation, diarrhea  or blood in the stool.  GU: Denies urgency, frequency, pain with urination, burning sensation, blood in urine, odor or discharge. Musculoskeletal: Patient reports joint pain, mainly in the right hip.  Denies decrease in range of motion, difficulty with gait, muscle pain or joint swelling.  Skin: Denies redness, rashes, lesions or ulcercations.  Neurological: Patient reports restless legs.  Denies dizziness, difficulty with memory, difficulty with speech or problems with balance and coordination.  Psych: Denies anxiety, depression, SI/HI.  No other specific complaints in a complete review of systems (except as listed in HPI above).      Objective:   Physical Exam  BP 134/88 (BP Location: Right Arm, Patient Position: Sitting, Cuff Size: Large)   Ht 5' 3 (1.6 m)   Wt 192 lb 6.4 oz (87.3 kg)   BMI 34.08 kg/m     Wt Readings from Last 3 Encounters:  10/11/23 189 lb 9.6 oz (86 kg)  09/27/23 189 lb 3.2 oz (85.8 kg)  09/04/23 189 lb 4 oz (85.8 kg)    General: Appears her stated age, obese, in NAD. Skin: Warm, dry and intact. HEENT: Head: normal shape and size; Eyes: sclera white, no icterus, conjunctiva pink, PERRLA and EOMs intact; Neck: Supple, trachea midline.  No masses, lumps or thyromegaly noted. Cardiovascular: Normal rate and rhythm. S1,S2 noted.  No murmur, rubs or gallops noted. No JVD or BLE edema.  Pulmonary/Chest: Normal effort  and positive vesicular breath sounds. No respiratory distress. No wheezes, rales or ronchi noted.  Musculoskeletal:  No difficulty with gait.  Neurological: Alert and oriented. Coordination normal.   BMET    Component Value Date/Time   NA 140 12/23/2023 1710   NA 141 06/05/2023 1624   K 3.9 12/23/2023 1710   CL 102 12/23/2023 1710   CO2 28 12/23/2023 1710   GLUCOSE 105 (H) 12/23/2023 1710   BUN 12 12/23/2023 1710   BUN 17 06/05/2023 1624   BUN 13.0 02/17/2015 1505   CREATININE 0.71 12/23/2023 1710   CREATININE 0.7 02/17/2015 1505   CALCIUM 9.2 12/23/2023 1710   GFRNONAA >60 12/23/2023 1710   GFRAA 124 09/13/2018 1115    Lipid Panel     Component Value Date/Time   CHOL 159 06/05/2023 1624   TRIG 74 06/05/2023 1624   HDL 70 06/05/2023 1624   CHOLHDL 2.3 06/05/2023 1624   CHOLHDL 2.6 01/19/2020 1120   VLDL 26 01/19/2020 1120   LDLCALC 75 06/05/2023 1624    CBC    Component Value Date/Time   WBC 5.7 12/23/2023 1710   RBC 4.87 12/23/2023 1710   HGB 13.8 12/23/2023 1710   HGB 13.6 06/05/2023 1624   HCT 42.9 12/23/2023 1710   HCT 42.4 06/05/2023 1624   PLT 235 12/23/2023 1710   PLT 295 06/05/2023 1624   MCV 88.1 12/23/2023 1710   MCV 91 06/05/2023 1624   MCH 28.3 12/23/2023 1710   MCHC 32.2 12/23/2023 1710   RDW 13.0 12/23/2023 1710   RDW 12.9 06/05/2023 1624   LYMPHSABS 2.0 06/05/2023 1624   MONOABS 0.6 01/19/2020 1120   EOSABS 0.3 06/05/2023 1624   BASOSABS 0.1 06/05/2023 1624    Hgb A1C Lab Results  Component Value Date   HGBA1C 5.9 (H) 06/05/2023            Assessment & Plan:   Assessment and Plan    Thyroid  nodule Indeterminate 23 mm thyroid  isthmus nodule on CT angio. No palpable nodule. - Ordered thyroid  ultrasound in [place].   ER  follow-up for other chest pain, paresthesia left upper extremity and HTN ER notes, labs and imaging reviewed ER workup normal. No recurrent pain since ER visit. - Continue amlodipine  5 mg daily - Start  daily aspirin  81 mg. -Reinforced DASH diet and exercise for weight loss - Follow up with cardiology on December 22nd.       RTC in 5 months for your annual exam Angeline Laura, NP

## 2024-01-03 NOTE — Progress Notes (Unsigned)
°  Cardiology Office Note   Date:  01/03/2024  ID:  Theresa Lester, Theresa Lester August 28, 1970, MRN [id number] PCP: Antonette Angeline ORN, NP  [facility] Providers Cardiologist:  None { Click to update primary MD,subspecialty MD or APP then REFRESH:1}    History of Present Illness Theresa Lester is a 53 y.o. female PMH morbid obesity, hypertension, TIA who presents for further evaluation management of chest discomfort.  Patient seen in the ED for this issue 12/23/2023.  Notably hypertensive.  Also had some left arm numbness/discomfort.  Symptoms resolved with blood pressure control.  Basic labs including serial troponin, TSH, CBC, BMP unremarkable.  CT head and CTPA unremarkable.  Last LDL 75 05/2023.  Relevant CVD History -CTPA 12/23/2023 no CAC - Monitor 10/2023 mean heart rate 88 bpm, rare ectopy, no arrhythmias - TTE 05/2015 normal biventricular function, no significant valvular disease   ROS: Pt denies any chest discomfort, jaw pain, arm pain, palpitations, syncope, presyncope, orthopnea, PND, or LE edema.  Studies Reviewed I have independently reviewed the patient's ECG, previous medical records, recent CT scan, previous blood work, previous cardiac testing.  Physical Exam VS:  There were no vitals taken for this visit.       Wt Readings from Last 3 Encounters:  12/28/23 192 lb 6.4 oz (87.3 kg)  10/11/23 189 lb 9.6 oz (86 kg)  09/27/23 189 lb 3.2 oz (85.8 kg)    GEN: No acute distress. NECK: No JVD; No carotid bruits. CARDIAC: ***RRR, no murmurs, rubs, gallops. RESPIRATORY:  Clear to auscultation. EXTREMITIES:  Warm and well-perfused. No edema.  ASSESSMENT AND PLAN Chest discomfort HTN History of TIA HLD        {Are you ordering a CV Procedure (e.g. stress test, cath, DCCV, TEE, etc)?   Press F2        :[id number]}  Dispo: ***  Signed, Caron Poser, MD

## 2024-01-04 ENCOUNTER — Ambulatory Visit
Admission: RE | Admit: 2024-01-04 | Discharge: 2024-01-04 | Disposition: A | Discharge status: Home / Self Care | Source: Ambulatory Visit | Attending: Internal Medicine | Admitting: Internal Medicine

## 2024-01-04 DIAGNOSIS — E041 Nontoxic single thyroid nodule: Secondary | ICD-10-CM | POA: Diagnosis present

## 2024-01-07 ENCOUNTER — Ambulatory Visit

## 2024-01-07 VITALS — BP 132/80 | HR 81 | Ht 63.0 in | Wt 192.4 lb

## 2024-01-07 DIAGNOSIS — I10 Essential (primary) hypertension: Secondary | ICD-10-CM

## 2024-01-07 DIAGNOSIS — Z8673 Personal history of transient ischemic attack (TIA), and cerebral infarction without residual deficits: Secondary | ICD-10-CM

## 2024-01-07 DIAGNOSIS — R079 Chest pain, unspecified: Secondary | ICD-10-CM | POA: Diagnosis not present

## 2024-01-07 DIAGNOSIS — R072 Precordial pain: Secondary | ICD-10-CM | POA: Diagnosis not present

## 2024-01-07 DIAGNOSIS — R0600 Dyspnea, unspecified: Secondary | ICD-10-CM | POA: Diagnosis not present

## 2024-01-07 DIAGNOSIS — R0609 Other forms of dyspnea: Secondary | ICD-10-CM

## 2024-01-07 DIAGNOSIS — E782 Mixed hyperlipidemia: Secondary | ICD-10-CM | POA: Diagnosis not present

## 2024-01-07 MED ORDER — LORAZEPAM 0.5 MG PO TABS: 0.5000 mg | ORAL_TABLET | Freq: Once | ORAL | 0 refills | Status: AC

## 2024-01-07 MED ORDER — METOPROLOL TARTRATE 50 MG PO TABS: ORAL_TABLET | ORAL | 0 refills | Status: AC

## 2024-01-07 NOTE — Addendum Note (Signed)
 Addended by: HARL HERON DEL on: 01/07/2024 09:05 AM   Modules accepted: Orders

## 2024-01-07 NOTE — Patient Instructions (Addendum)
 Medication Instructions:  Your physician recommends that you continue on your current medications as directed. Please refer to the Current Medication list given to you today.  *If you need a refill on your cardiac medications before your next appointment, please call your pharmacy*  Lab Work: Your provider would like for you to have following labs drawn today BMP.   If you have labs (blood work) drawn today and your tests are completely normal, you will receive your results only by: MyChart Message (if you have MyChart) OR A paper copy in the mail If you have any lab test that is abnormal or we need to change your treatment, we will call you to review the results.  Testing/Procedures: Your physician has requested that you have an echocardiogram. Echocardiography is a painless test that uses sound waves to create images of your heart. It provides your doctor with information about the size and shape of your heart and how well your hearts chambers and valves are working.   You may receive an ultrasound enhancing agent through an IV if needed to better visualize your heart during the echo. This procedure takes approximately one hour.  There are no restrictions for this procedure.  This will take place at [address] [facility] [address], [place] [address]  Please note: We ask at that you not bring children with you during ultrasound (echo/ vascular) testing. Due to room size and safety concerns, children are not allowed in the ultrasound rooms during exams. Our front office staff cannot provide observation of children in our lobby area while testing is being conducted. An adult accompanying a patient to their appointment will only be allowed in the ultrasound room at the discretion of the ultrasound technician under special circumstances. We apologize for any inconvenience.     Your cardiac CT will be scheduled at one of the below locations:   [facility] [address] [place], [place] [address] [phone]  Please arrive 15 mins early for check-in and test prep.  There is spacious parking and easy access to the radiology department from the [facility] entrance. Please enter here and check-in with the desk attendant.   Please follow these instructions carefully (unless otherwise directed):  An IV will be required for this test and Nitroglycerin will be given.   On the Night Before the Test: Be sure to Drink plenty of water . Do not consume any caffeinated/decaffeinated beverages or chocolate 12 hours prior to your test. Do not take any antihistamines 12 hours prior to your test.   On the Day of the Test: Drink plenty of water  until 1 hour prior to the test. Do not eat any food 1 hour prior to test. You may take your regular medications prior to the test.  Take metoprolol  (Lopressor ) 50 mg two hours prior to test. Take Ativan  0.5 mg one hour prior to test FEMALES- please wear underwire-free bra if available, avoid dresses & tight       After the Test: Drink plenty of water . After receiving IV contrast, you may experience a mild flushed feeling. This is normal. On occasion, you may experience a mild rash up to 24 hours after the test. This is not dangerous. If this occurs, you can take Benadryl 25 mg, Zyrtec, Claritin, or Allegra  and increase your fluid intake. (Patients taking Tikosyn should avoid Benadryl, and may take Zyrtec, Claritin, or Allegra ) If you experience trouble breathing, this can be serious. If it is  severe call 911 IMMEDIATELY. If it is mild, please call our office.  We will call to schedule your test 2-4 weeks out understanding that some insurance companies will need an authorization prior to the service being performed.   For more information and frequently asked questions, please visit our website : [email or web address]  For non-scheduling related questions, please contact  the cardiac imaging nurse navigator should you have any questions/concerns: Cardiac Imaging Nurse Navigators Direct Office Dial: [phone]   For scheduling needs, including cancellations and rescheduling, please call Brittany, [phone].    Follow-Up: At [facility], you and your health needs are our priority.  As part of our continuing mission to provide you with exceptional heart care, our providers are all part of one team.  This team includes your primary Cardiologist (physician) and Advanced Practice Providers or APPs (Physician Assistants and Nurse Practitioners) who all work together to provide you with the care you need, when you need it.  Your next appointment:   3 month(s)  Provider:  Caron Poser, MD     We recommend signing up for the patient portal called MyChart.  Sign up information is provided on this After Visit Summary.  MyChart is used to connect with patients for Virtual Visits (Telemedicine).  Patients are able to view lab/test results, encounter notes, upcoming appointments, etc.  Non-urgent messages can be sent to your provider as well.   To learn more about what you can do with MyChart, go to [email or web address].

## 2024-01-08 ENCOUNTER — Ambulatory Visit: Payer: Self-pay

## 2024-01-08 LAB — BASIC METABOLIC PANEL WITH GFR
BUN/Creatinine Ratio: 22 (ref 9–23)
BUN: 14 mg/dL (ref 6–24)
CO2: 24 mmol/L (ref 20–29)
Calcium: 9.2 mg/dL (ref 8.7–10.2)
Chloride: 101 mmol/L (ref 96–106)
Creatinine, Ser: 0.64 mg/dL (ref 0.57–1.00)
Glucose: 90 mg/dL (ref 70–99)
Potassium: 4.1 mmol/L (ref 3.5–5.2)
Sodium: 138 mmol/L (ref 134–144)
eGFR: 104 mL/min/1.73

## 2024-01-11 ENCOUNTER — Ambulatory Visit: Payer: Self-pay | Admitting: Internal Medicine

## 2024-01-21 ENCOUNTER — Encounter: Payer: Self-pay | Admitting: Internal Medicine

## 2024-01-21 MED ORDER — AMLODIPINE BESYLATE 5 MG PO TABS: 5.0000 mg | ORAL_TABLET | Freq: Every day | ORAL | 1 refills | Status: AC

## 2024-01-29 ENCOUNTER — Encounter (HOSPITAL_COMMUNITY): Payer: Self-pay

## 2024-01-29 MED ORDER — DIAZEPAM 5 MG PO TABS: ORAL_TABLET | ORAL | 0 refills | Status: AC

## 2024-01-29 NOTE — Addendum Note (Signed)
 Addended by: ANTONETTE ANGELINE ORN on: 01/29/2024 11:16 AM   Modules accepted: Orders

## 2024-01-31 ENCOUNTER — Ambulatory Visit
Admission: RE | Admit: 2024-01-31 | Discharge: 2024-01-31 | Disposition: A | Discharge status: Home / Self Care | Source: Ambulatory Visit

## 2024-01-31 DIAGNOSIS — R072 Precordial pain: Secondary | ICD-10-CM | POA: Diagnosis present

## 2024-01-31 MED ORDER — NITROGLYCERIN 0.4 MG SL SUBL
0.8000 mg | SUBLINGUAL_TABLET | Freq: Once | SUBLINGUAL | Status: AC
  Administered 2024-01-31: 0.8 mg via SUBLINGUAL
  Filled 2024-01-31: qty 25

## 2024-01-31 MED ORDER — METOPROLOL TARTRATE 5 MG/5ML IV SOLN
INTRAVENOUS | Status: AC
  Filled 2024-01-31: qty 10

## 2024-01-31 MED ORDER — IOHEXOL 350 MG/ML SOLN
100.0000 mL | Freq: Once | INTRAVENOUS | Status: AC | PRN
  Administered 2024-01-31: 100 mL via INTRAVENOUS

## 2024-01-31 MED ORDER — DILTIAZEM HCL 25 MG/5ML IV SOLN: 10.0000 mg | INTRAVENOUS | Status: DC | PRN

## 2024-01-31 MED ORDER — METOPROLOL TARTRATE 5 MG/5ML IV SOLN
10.0000 mg | Freq: Once | INTRAVENOUS | Status: AC | PRN
  Administered 2024-01-31: 10 mg via INTRAVENOUS

## 2024-02-07 ENCOUNTER — Ambulatory Visit

## 2024-02-21 ENCOUNTER — Ambulatory Visit

## 2024-02-28 ENCOUNTER — Ambulatory Visit

## 2024-04-10 ENCOUNTER — Ambulatory Visit

## 2024-06-10 ENCOUNTER — Encounter: Admitting: Internal Medicine
